# Patient Record
Sex: Male | Born: 1935 | Race: Black or African American | Hispanic: No | Marital: Married | Smoking: Never smoker
Health system: Southern US, Community
[De-identification: ages and names within clinical notes are randomized; demographics above are authoritative.]

## PROBLEM LIST (undated history)

## (undated) DIAGNOSIS — F329 Major depressive disorder, single episode, unspecified: Secondary | ICD-10-CM

## (undated) DIAGNOSIS — F0280 Dementia in other diseases classified elsewhere without behavioral disturbance: Secondary | ICD-10-CM

## (undated) DIAGNOSIS — R569 Unspecified convulsions: Secondary | ICD-10-CM

## (undated) DIAGNOSIS — H409 Unspecified glaucoma: Secondary | ICD-10-CM

## (undated) DIAGNOSIS — I82409 Acute embolism and thrombosis of unspecified deep veins of unspecified lower extremity: Secondary | ICD-10-CM

## (undated) DIAGNOSIS — C61 Malignant neoplasm of prostate: Secondary | ICD-10-CM

## (undated) DIAGNOSIS — E785 Hyperlipidemia, unspecified: Secondary | ICD-10-CM

## (undated) DIAGNOSIS — M1990 Unspecified osteoarthritis, unspecified site: Secondary | ICD-10-CM

## (undated) DIAGNOSIS — E7800 Pure hypercholesterolemia, unspecified: Secondary | ICD-10-CM

## (undated) DIAGNOSIS — N189 Chronic kidney disease, unspecified: Secondary | ICD-10-CM

## (undated) DIAGNOSIS — Z227 Latent tuberculosis: Secondary | ICD-10-CM

## (undated) DIAGNOSIS — E119 Type 2 diabetes mellitus without complications: Secondary | ICD-10-CM

## (undated) DIAGNOSIS — F0390 Unspecified dementia without behavioral disturbance: Secondary | ICD-10-CM

## (undated) DIAGNOSIS — G309 Alzheimer's disease, unspecified: Secondary | ICD-10-CM

## (undated) HISTORY — DX: Unspecified convulsions: R56.9

## (undated) HISTORY — DX: Latent tuberculosis: Z22.7

## (undated) HISTORY — DX: Major depressive disorder, single episode, unspecified: F32.9

## (undated) HISTORY — DX: Dementia in other diseases classified elsewhere without behavioral disturbance: F02.80

## (undated) HISTORY — PX: OTHER SURGICAL HISTORY: SHX169

## (undated) HISTORY — DX: Acute embolism and thrombosis of unspecified deep veins of unspecified lower extremity: I82.409

## (undated) HISTORY — DX: Hyperlipidemia, unspecified: E78.5

## (undated) HISTORY — DX: Unspecified osteoarthritis, unspecified site: M19.90

## (undated) HISTORY — DX: Alzheimer's disease, unspecified: G30.9

## (undated) HISTORY — DX: Dementia in other diseases classified elsewhere, unspecified severity, without behavioral disturbance, psychotic disturbance, mood disturbance, and anxiety: F02.80

## (undated) HISTORY — DX: Pure hypercholesterolemia, unspecified: E78.00

---

## 1998-05-09 ENCOUNTER — Ambulatory Visit (HOSPITAL_BASED_OUTPATIENT_CLINIC_OR_DEPARTMENT_OTHER): Admission: RE | Admit: 1998-05-09 | Discharge: 1998-05-09 | Payer: Self-pay | Admitting: Orthopaedic Surgery

## 2000-06-04 ENCOUNTER — Emergency Department (HOSPITAL_COMMUNITY): Admission: EM | Admit: 2000-06-04 | Discharge: 2000-06-04 | Payer: Self-pay | Admitting: Emergency Medicine

## 2000-06-08 ENCOUNTER — Emergency Department (HOSPITAL_COMMUNITY): Admission: EM | Admit: 2000-06-08 | Discharge: 2000-06-08 | Payer: Self-pay | Admitting: Emergency Medicine

## 2000-06-08 ENCOUNTER — Encounter: Payer: Self-pay | Admitting: Emergency Medicine

## 2003-06-03 ENCOUNTER — Ambulatory Visit (HOSPITAL_COMMUNITY): Admission: RE | Admit: 2003-06-03 | Discharge: 2003-06-03 | Payer: Self-pay | Admitting: Gastroenterology

## 2004-06-17 DIAGNOSIS — C61 Malignant neoplasm of prostate: Secondary | ICD-10-CM

## 2004-06-17 HISTORY — DX: Malignant neoplasm of prostate: C61

## 2005-01-28 ENCOUNTER — Ambulatory Visit (HOSPITAL_COMMUNITY): Admission: RE | Admit: 2005-01-28 | Discharge: 2005-01-28 | Payer: Self-pay | Admitting: Urology

## 2005-02-04 ENCOUNTER — Ambulatory Visit: Admission: RE | Admit: 2005-02-04 | Discharge: 2005-03-05 | Payer: Self-pay | Admitting: Radiation Oncology

## 2005-04-15 ENCOUNTER — Ambulatory Visit: Admission: RE | Admit: 2005-04-15 | Discharge: 2005-07-14 | Payer: Self-pay | Admitting: Radiation Oncology

## 2005-07-10 ENCOUNTER — Encounter: Payer: Self-pay | Admitting: Emergency Medicine

## 2005-07-11 ENCOUNTER — Inpatient Hospital Stay (HOSPITAL_COMMUNITY): Admission: EM | Admit: 2005-07-11 | Discharge: 2005-07-19 | Payer: Self-pay | Admitting: Internal Medicine

## 2005-07-11 ENCOUNTER — Encounter (INDEPENDENT_AMBULATORY_CARE_PROVIDER_SITE_OTHER): Payer: Self-pay | Admitting: Cardiology

## 2005-07-25 ENCOUNTER — Encounter: Admission: RE | Admit: 2005-07-25 | Discharge: 2005-10-23 | Payer: Self-pay | Admitting: Internal Medicine

## 2006-11-06 ENCOUNTER — Emergency Department (HOSPITAL_COMMUNITY): Admission: EM | Admit: 2006-11-06 | Discharge: 2006-11-06 | Payer: Self-pay | Admitting: Emergency Medicine

## 2006-11-14 ENCOUNTER — Encounter: Admission: RE | Admit: 2006-11-14 | Discharge: 2006-11-14 | Payer: Self-pay | Admitting: Internal Medicine

## 2009-07-20 ENCOUNTER — Encounter: Admission: RE | Admit: 2009-07-20 | Discharge: 2009-07-20 | Payer: Self-pay | Admitting: Neurology

## 2009-08-31 ENCOUNTER — Encounter: Admission: RE | Admit: 2009-08-31 | Discharge: 2009-08-31 | Payer: Self-pay | Admitting: General Surgery

## 2009-09-04 ENCOUNTER — Ambulatory Visit (HOSPITAL_BASED_OUTPATIENT_CLINIC_OR_DEPARTMENT_OTHER): Admission: RE | Admit: 2009-09-04 | Discharge: 2009-09-04 | Payer: Self-pay | Admitting: General Surgery

## 2010-05-20 ENCOUNTER — Emergency Department (HOSPITAL_COMMUNITY)
Admission: EM | Admit: 2010-05-20 | Discharge: 2010-05-20 | Payer: Self-pay | Source: Home / Self Care | Admitting: Emergency Medicine

## 2010-08-27 LAB — POCT CARDIAC MARKERS
CKMB, poc: 1.5 ng/mL (ref 1.0–8.0)
CKMB, poc: 1.8 ng/mL (ref 1.0–8.0)
Troponin i, poc: <0.05 ng/mL (ref 0.00–0.09)
Troponin i, poc: <0.05 ng/mL (ref 0.00–0.09)

## 2010-08-27 LAB — BASIC METABOLIC PANEL
CO2: 23 mEq/L (ref 19–32)
Calcium: 8.5 mg/dL (ref 8.4–10.5)
Creatinine, Ser: 1.42 mg/dL (ref 0.4–1.5)
GFR calc Af Amer: 59 mL/min — ABNORMAL LOW (ref >60)
GFR calc non Af Amer: 49 mL/min — ABNORMAL LOW (ref >60)
Glucose, Bld: 132 mg/dL — ABNORMAL HIGH (ref 70–99)

## 2010-08-27 LAB — DIFFERENTIAL
Lymphocytes Relative: 12 % (ref 12–46)
Lymphs Abs: 0.6 10*3/uL — ABNORMAL LOW (ref 0.7–4.0)
Monocytes Relative: 5 % (ref 3–12)
Neutro Abs: 3.9 10*3/uL (ref 1.7–7.7)
Neutrophils Relative %: 81 % — ABNORMAL HIGH (ref 43–77)

## 2010-08-27 LAB — CBC
Platelets: 184 10*3/uL (ref 150–400)
RBC: 4.44 MIL/uL (ref 4.22–5.81)
WBC: 4.9 10*3/uL (ref 4.0–10.5)

## 2010-09-07 LAB — BASIC METABOLIC PANEL
BUN: 17 mg/dL (ref 6–23)
Calcium: 9.3 mg/dL (ref 8.4–10.5)
Creatinine, Ser: 1.35 mg/dL (ref 0.4–1.5)
Glucose, Bld: 78 mg/dL (ref 70–99)

## 2010-09-07 LAB — DIFFERENTIAL
Basophils Absolute: 0 10*3/uL (ref 0.0–0.1)
Eosinophils Absolute: 0.1 10*3/uL (ref 0.0–0.7)
Eosinophils Relative: 2 % (ref 0–5)
Monocytes Absolute: 0.4 10*3/uL (ref 0.1–1.0)
Monocytes Relative: 9 % (ref 3–12)
Neutro Abs: 3.2 10*3/uL (ref 1.7–7.7)

## 2010-09-07 LAB — CBC
MCHC: 32.3 g/dL (ref 30.0–36.0)
MCV: 96.3 fL (ref 78.0–100.0)
RBC: 4.77 MIL/uL (ref 4.22–5.81)

## 2010-09-07 LAB — GLUCOSE, CAPILLARY: Glucose-Capillary: 99 mg/dL (ref 70–99)

## 2010-11-02 NOTE — Discharge Summary (Signed)
NAME:  Derrick Rubio, Derrick Rubio NO.:  [id number]   MEDICAL RECORD NO.:  [id number]          PATIENT TYPE:  INP   LOCATION:  6706                         FACILITY:  [facility]   PHYSICIAN:  Ladell Pier, M.D.   DATE OF BIRTH:  12-27-1935   DATE OF ADMISSION:  07/11/2005  DATE OF DISCHARGE:                                 DISCHARGE SUMMARY   DISCHARGE DIAGNOSES:  1.  Hyperosmolar, nonketotic coma (HNK).  2.  New-onset diabetes.  3.  Rhabdomyolysis.  4.  Hyperkalemia.  5.  Hypokalemia.  6.  Acute on chronic renal failure.  7.  Fever/leukocytosis.  8.  History of abnormal liver function tests.  9.  History of impotence.  10. History of atypical chest pain with normal cardiac stress test.  11. History of prostate cancer, status post radiation therapy and seeding in      November 2006.  12. History of elevated troponin, most likely secondary to acute renal      failure and rhabdomyolysis.  13. Hypernatremia.  14. Altered mental status secondary to hyperosmolar coma.  15. Elevated amylase, most likely secondary to rhabdomyolysis.  16. Anemia.   DISCHARGE MEDICATIONS:  1.  Lantus 34 units q.h.s.  2.  Glucotrol XL 10 mg q.a.m.  3.  Sliding scale insulin, moderate sensitivity.  4.  Aspirin 81 mg daily.  5.  Flomax 0.4 mg daily.  6.  Avelox 400 mg daily x7 days.   FOLLOW-UP APPOINTMENTS:  Patient to follow up in the office for diabetic  teaching classes and follow up with Dr. Olena Leatherwood in one to two weeks.   CONSULTATIONS:  Renal for acute renal failure, the patient to follow up with  Dr. Kathrene Bongo in two weeks.   PROCEDURES:  The patient had a PICC line placed and a Panda placed.   HISTORY OF PRESENT ILLNESS:  The patient is a 75 year old African-American  male brought by his wife to the emergency room secondary to weight loss and  urinary frequency.  He has had dry mouth for the past few days.  The patient  developed confusion and change in his mental status prior to  arriving in the  emergency room.  He had no chest pain or shortness of breath or abdominal  pain.  He complained of pain in the groin area secondary to his prostate  cancer treatment.   Past medical history, family history, social history, medications,  allergies, review of systems are as per admission H&P.   PHYSICAL EXAMINATION ON DISCHARGE:  VITAL SIGNS:  Temperature 98.9, pulse of  80, respirations 20, blood pressure 95/62.  CBGs 272-187-272.  Pulse  oximetry 98% on room air.  HEENT:  Head is normocephalic, atraumatic.  Pupils equal, round, and  reactive to light.  Throat without erythema.  CARDIOVASCULAR:  Regular rate and rhythm.  LUNGS:  Clear bilaterally.  ABDOMEN:  Positive bowel sounds, soft, nontender, nondistended.  EXTREMITIES:  Without edema.   HOSPITAL COURSE:  Problem 1.  HYPEROSMOLAR, NONKETOTIC HYPERGLYCEMIC COMA:  The patient was  admitted to the intensive care unit.  On admission his blood sugar was over  1100, it was 1157.  The patient was started on a Glucommander and his blood  sugars came down into the low 100s.  However, because he was hypernatremic,  he was kept on the Glucommander for quite a few days until his sodium  resolved because he was getting free water to prevent cell lysis.  On  discharge his blood sugars were running in the 187-277.  His diabetic  medications will be adjusted on an outpatient basis.   Problem 2.  HYPERNATREMIA:  During this hospitalization on admission, his  sodium was in the 150s.  He got normal saline for volume replacement because  he was dehydrated.  His sodium, however, went up into the 160s, and he  received half normal saline but sodium continued to increase.  His half  normal saline was changed to quarter normal saline and sodium still was  elevated.  His half normal saline was changed to D5 water.  Renal was  consulted and suggested also placing a Panda and giving him free water  through the [facility], given that his  sodium had normalized.   Problem 3.  ACUTE ON CHRONIC RENAL FAILURE:  His creatinine on admission was  elevated.  During the rehydration process, creatinine came down.  On  discharge his creatinine was 1.9.  He will follow up with nephrology  outpatient.  On discharge his creatinine was 1.6.   Problem 4.  ABNORMAL LIVER FUNCTION TESTS:  He has had a history of abnormal  liver function tests with his prior physician that was worked up outpatient.  Will review more blood work or tests on outpatient basis.  His ultrasound  study he received while inpatient was normal.   Problem 5.  ABNORMAL ELECTROCARDIOGRAM/MILDLY ELEVATED TROPONINS:  He had a  2-D echo that was normal.  He has had outpatient cardiac workup, stress test  that was also normal.  The patient had no chest pain during his  hospitalization.   Problem 6.  FEVER/LEUKOCYTOSIS:  He has had a fever and elevated white count  during his hospitalization.  Blood cultures, urine cultures were normal.  Chest x-ray did show some atelectasis.  He was placed on Rocephin and  switched to Zosyn and Avelox.  His fever resolved.  He had no respiratory,  no urinary symptoms.  He was discharged on Avelox for seven days.  A  possible reason for his fever was possibly from atelectasis, or it could be  a small infiltrate in the lungs.   Problem 7.  ANEMIA:  His hemoglobin dropped during his hospitalization.  On  discharge his hemoglobin was 10.5.  On follow-up will evaluate, recheck CBC  and evaluate to see if the patient has had a screening colonoscopy.  If not  done, then the patient will receive screening colonoscopy outpatient.   Problem 8.  HISTORY OF PROSTATE CANCER:  The patient will follow up with his  urologist for his prostate cancer.  He sees Dr. Brunilda Payor.   Problem 9.  ALTERED MENTAL STATUS:  When sodium/hypernatremia resolved, his mental status cleared.  He was alert and oriented x3.  He was transferred  out from the ICU to the regular  floor and did well, ambulating with his  family, and was able to ask questions and seemed back to his baseline per  his family prior to discharge.   Problem 10.  RHABDOMYOLYSIS:  His CK was elevated up into the 30,000.  On  discharge, his CK had decreased down to 734.   Problem 11.  HYPERKALEMIA:  After he got fluid replacement, his  potassium/hyperkalemia resolved.  He became hypokalemic and required  potassium replacement.   DISCHARGE LABORATORY DATA:  CK 734.  Sodium 136, potassium 3.5, chloride  105, CO2 23, glucose 224, BUN 13, creatinine 1.6, calcium 8.1.  BNP 93.  CBC:  WBC 10.5, hemoglobin 10.6, platelets 192, MCV of 92.  Total protein  was 1.8 g.  UPEP:  No gamma chains noted.  It was more nonspecific pattern.  Blood cultures negative x2.      Ladell Pier, M.D.  Electronically Signed     NJ/MEDQ  D:  07/18/2005  T:  07/18/2005  Job:  [id number]   cc:   Lindaann Slough, M.D.  Fax: [phone]

## 2010-11-02 NOTE — Discharge Summary (Signed)
NAME:  Derrick, Rubio NO.:  [id number]   MEDICAL RECORD NO.:  [id number]           PATIENT TYPE:   LOCATION:                                 FACILITY:   PHYSICIAN:  Ladell Pier, M.D.        DATE OF BIRTH:   DATE OF ADMISSION:  07/11/2005  DATE OF DISCHARGE:  07/19/2005                                 DISCHARGE SUMMARY   ADDENDUM:   DISCHARGE DIAGNOSES:  1.  Hiccups.  Patient was not discharged on the 24th.  He stayed on until      the 25th because he was having severe hiccups.  For the hiccups sucking      on lemon, gargling with ice water was tried and also chlorpromazine 25      mg t.i.d. with just a small improvement in the hiccups.  They were not      as severe.  Patient will go home.  Discharged him.  In addition to other      medications.  He was discharged on Reglan 10 mg t.i.d. a.c. and q.h.s.,      Protonix 40 mg b.i.d., Ambien 10 mg q.h.s. as needed, and K-Dur 40 mEq      twice daily x2 days.  He has an appointment to follow up in the office      on February 8.  Also, he was told to continue the use the incentive      spirometer.  If symptoms do not improve then will do further work-up for      hiccups.  2.  Hypokalemia.  On discharge potassium was 3.3.  Magnesium level was added      to the laboratory and he was given K-Dur 40 and discharge with potassium      40 b.i.d. x2 days.      Ladell Pier, M.D.  Electronically Signed     NJ/MEDQ  D:  07/19/2005  T:  07/19/2005  Job:  [id number]   cc:   Cecille Aver, M.D.  Fax: [phone]   Lindaann Slough, M.D.  Fax: [phone]

## 2010-11-02 NOTE — Consult Note (Signed)
NAME:  KAVEON, BLATZ NO.:  [id number]   MEDICAL RECORD NO.:  [id number]          PATIENT TYPE:  INP   LOCATION:  2112                         FACILITY:  [facility]   PHYSICIAN:  Cecille Aver, M.D.DATE OF BIRTH:  May 19, 1936   DATE OF CONSULTATION:  07/12/2005  DATE OF DISCHARGE:                                   CONSULTATION   REASON FOR REFERRAL:  Hypernatremia and chronic kidney disease.   HISTORY OF PRESENT ILLNESS:  Mr. Dugal is a 75 year old white male  with a past medical history significant for prostate cancer, status post XRT  and seeds in November 2006. Otherwise, he had been relatively healthy with  no history of diabetes mellitus. He presented with confusion and polyuria  and was discovered to have a glucose of greater than 1100, essentially HONK  with an initial creatinine of 2.3 with an unknown baseline. With treatment  to get sugar down he has now developed hypernatremia and continues to be  confused. Creatinine remains at 2.0. The patient does have a good urine  output. He also does have an element of rhabdomyolysis with a CK of 31,000.  We are asked to assist with sodium. Appropriate changes are made this  morning to his IV fluids. We are also starting to give him free water per  [facility]. The patient is sedated at this time and history is obtained by family  members at bedside.   PAST MEDICAL HISTORY:  1.  Prostate cancer as above.  2.  Degenerative joint disease.  3.  History of abnormal LFTs, unsure of what workup has been done.   CURRENT MEDICATIONS:  Lovenox, Protonix, Seroquel 25 mg b.i.d., D-5-W drip  at 150 an hour, free water 250 q.6h.   ALLERGIES:  No known drug allergies.   SOCIAL HISTORY:  Obtained from the family. The patient was very functional,  working out regularly.   FAMILY HISTORY:  Negative for kidney disease.   REVIEW OF SYSTEMS:  Unable to obtain from the patient due to decreased  mental status. He is  sedated due to frequent agitation.   PHYSICAL EXAMINATION:  VITAL SIGNS: Temperature is 100.6, blood pressure 90  to 120 over 70, heart rate 100, respirations are normal, oxygen saturation  is 92% on room air.  Urine output was recorded at 705 mL over the last  shift.  GENERAL: The patient is sedated, resting. He is responsive only to noxious  stimuli. He had just received Phenergan, morphine, and Seroquel. Based on  mental status, there has been agitation this hospitalization.  HEENT: Pupils equal, round, and reactive to light. Extraocular motions are  intact. Mucous membranes are moist.  NECK: There is no jugular venous distention.  LUNGS: Anteriorly clear.  CARDIOVASCULAR: Tachycardic without murmurs.  ABDOMEN: Positive bowel sounds, soft, nontender.  EXTREMITIES: Some stasis edema.  NEUROLOGIC: The patient is somnolent and cannot follow commands.   LABORATORY DATA:  This morning sodium 162, chloride greater than 130, BUN  and creatinine 20/2.0, AST 572, ALT 176, albumin 3.1, hemoglobin 14.9, CK  31,000.   Abdominal ultrasound reveals normal gallbladder, increased echogenicity of  the liver,  but the kidneys are normal size with no hydro.   ASSESSMENT:  A 75 year old black male with HONK, hypernatremia, and free  water deficit with decreased mental status.  1.  Hypernatremia  with free water deficit. Have increased free water      getting IV, NPO. Will monitor sodium closely.  2.  HONK. The patient is on insulin drip and this is managed per the primary      team.  3.  Increased liver function tests, apparently chronic in nature. Abdominal      ultrasound is negative. This is also __________ .  4.  Confusion: Possibly due some to hypernatremia. He is on some sedating      medications and with abnormal renal function and abnormal liver function      this could be playing a role.  5.  Rhabdomyolysis: Makes it even more important to make sure that he is      hydrated. Creatinine is  2, but stable. Possibly could be subacute      secondary to volume depletion. Historic information would be helpful.      We will try to confirm with Dr. Olena Leatherwood what his creatinine was as an      outpatient.           ______________________________  Cecille Aver, M.D.     KAG/MEDQ  D:  07/12/2005  T:  07/12/2005  Job:  [id number]

## 2010-11-02 NOTE — Op Note (Signed)
NAME:  [name], [name] NO.:  [id number]   MEDICAL RECORD NO.:  [id number]                   PATIENT TYPE:  AMB   LOCATION:  ENDO                                 [facility]ACILITY[facility]  [facility]   PHYSICIAN:  Danise Edge, M.D.                DATE OF BIRTH:  05-Oct-1935   DATE OF PROCEDURE:  06/03/2003  DATE OF DISCHARGE:                                 OPERATIVE REPORT   PROCEDURE:  Screening colonoscopy.   INDICATIONS FOR PROCEDURE:  Derrick Rubio is a 75 year old male  born 02-Mar-1936.  Derrick Rubio is scheduled to undergo his first  screening colonoscopy with polypectomy to prevent colon cancer.   ENDOSCOPIST:  Danise Edge, M.D.   PREMEDICATION:  Versed 5 mg, Demerol 50 mg.   DESCRIPTION OF PROCEDURE:  After obtaining informed consent, Derrick Rubio  was placed in the left lateral decubitus position. I administered  intravenous Demerol and intravenous Versed to achieve conscious sedation for  the procedure. The patient's blood pressure, oxygen saturation and cardiac  rhythm were monitored throughout the procedure and documented in the medical  record.   Anal inspection was normal. Digital rectal exam revealed an enlarged but  nonnodular prostate.  The Olympus adjustable pediatric colonoscope was  introduced into the rectum and advanced to the cecum. A normal appearing  ileocecal valve was intubated and the distal ileum inspected. Colonic  preparation for the exam today was satisfactory.   RECTUM:  Normal.   SIGMOID COLON AND DESCENDING COLON:  Normal.   SPLENIC FLEXURE:  Normal.   TRANSVERSE COLON:  Normal.   HEPATIC FLEXURE:  Normal.   ASCENDING COLON:  Normal.   CECUM AND ILEOCECAL VALVE:  Normal.   DISTAL ILEUM:  Normal.   ASSESSMENT:  Normal screening proctocolonoscopy to the cecum. No endoscopic  evidence for the presence of colorectal neoplasia.                                               Danise Edge,  M.D.    MJ/MEDQ  D:  06/03/2003  T:  06/04/2003  Job:  [id number]   cc:   Ike Bene, M.D.  [address]  [place]  [place] [phone]  Fax: [phone]

## 2010-11-02 NOTE — H&P (Signed)
NAME:  Derrick Rubio, MICUCCI NO.:  [id number]   MEDICAL RECORD NO.:  [id number]          PATIENT TYPE:  INP   LOCATION:  0101                         FACILITY:  [facility]   PHYSICIAN:  Ladell Pier, M.D.   DATE OF BIRTH:  January 13, 1936   DATE OF ADMISSION:  07/10/2005  DATE OF DISCHARGE:                                HISTORY & PHYSICAL   CHIEF COMPLAINT:  Altered mental status and weight loss.   HISTORY OF PRESENT ILLNESS:  The patient is a 75 year old African-American  male brought by his wife to the ED secondary to weight loss and urinary  frequency.  Dry mouth for the past few days.  Then today, he developed  confusion and change in mental status, so she brought him to the emergency  room.  He had no chest pain, no shortness of breath, no abdominal pain.  He  complained of pain in the groin area from his prostate cancer and seeding  implants.   PAST MEDICAL HISTORY:  1.  Osteoarthritis.  2.  Prostate cancer status post XRT and seeding in November of 2006.  3.  Atypical chest pain.  Normal cardiac stress test.  4.  History of abnormal LFT's.  5.  History of impotence.   FAMILY HISTORY:  Father died at 99 from heart disease.  Mother died at 64  from coronary artery disease and diabetes.  Four brothers - one died of a  heart attack, one died of a gunshot wound.  Five sisters - one died of  complications of hypertension.   SOCIAL HISTORY:  He is married.  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  He does not smoke or drink alcohol.   MEDICATIONS:  1.  Aspirin 81 mg daily.  2.  Flomax 0.4 mg daily.   ALLERGIES:  No known drug allergies.   REVIEW OF SYSTEMS:  Unable to obtain secondary to altered mental status.   PHYSICAL EXAMINATION:  VITAL SIGNS:  Temperature 98.3, blood pressure  115/83, pulse 119, respirations 18, pulse oximetry 99% on room air.  HEENT:  Normocephalic and atraumatic.  Pupils equal, round and reactive to  light.   Throat without erythema.  CARDIOVASCULAR:  Regular rate and rhythm.  LUNGS:  Clear bilaterally.  ABDOMEN:  Positive bowel sounds, soft, nontender.  EXTREMITIES:  Without edema.   LABORATORY DATA:  WBCs of 10.6, hemoglobin of 16.3, platelets 290.  MCV 94.  Sodium 137, potassium 6.8, chloride 99, CO2 of 21, BUN 47, creatinine 2.3,  glucose 1157, calcium 9.6, potassium repeat at 5.3.  Anion gap of 17.  Myoglobin greater than 500.  MB fraction 12.3.  Troponin less than 0.05.  CK  3410.  EKG still sinus tachycardia.  No acute ST segment elevation or  depression.  Chest x-ray with no acute disease.   ASSESSMENT AND PLAN:  1.  Hyperosmolar non-ketotic coma (HNK).  The patient is now a new onset      diabetic.  Will look for any source of infection.  Will do blood      cultures, UA, hemoglobin A1C, chest x-ray, CMP, CBC, PT  INR, cardiac      enzymes.  Will place him on an insulin drip and give IV fluids.  2.  Rhabdomyolysis.  Will check serial creatinine kinase, given IV fluids.      Normal saline bolus x5 liters of half normal saline.  3.  Hyperkalemia, most likely secondary to electrolyte deficits.  The      patient most like has an electrolyte deficit.  Will monitor potassium      and other electrolytes.  4.  Acute renal failure secondary to dehydration.  Will continue IV fluids      and watch out for renal failure with rhabdomyolysis.      Ladell Pier, M.D.  Electronically Signed     NJ/MEDQ  D:  07/10/2005  T:  07/10/2005  Job:  [id number]

## 2013-03-08 ENCOUNTER — Encounter: Payer: Self-pay | Admitting: *Deleted

## 2013-03-08 ENCOUNTER — Ambulatory Visit (INDEPENDENT_AMBULATORY_CARE_PROVIDER_SITE_OTHER): Payer: Medicare Other | Admitting: Internal Medicine

## 2013-03-08 ENCOUNTER — Encounter: Payer: Self-pay | Admitting: Internal Medicine

## 2013-03-08 VITALS — BP 162/80 | HR 56 | Temp 97.6°F | Ht 72.0 in | Wt 199.0 lb

## 2013-03-08 DIAGNOSIS — A150 Tuberculosis of lung: Secondary | ICD-10-CM

## 2013-03-08 DIAGNOSIS — Z227 Latent tuberculosis: Secondary | ICD-10-CM

## 2013-03-08 MED ORDER — RIFAMPIN 300 MG PO CAPS: 600.0000 mg | ORAL_CAPSULE | Freq: Every day | ORAL | Status: DC

## 2013-03-08 MED ORDER — PROMETHAZINE HCL 25 MG PO TABS: 25.0000 mg | ORAL_TABLET | Freq: Four times a day (QID) | ORAL | Status: DC | PRN

## 2013-03-08 NOTE — Progress Notes (Signed)
[facility] NOTE  RFV: community referral for positive PPD Subjective:    Patient ID: Derrick Rubio, male    DOB: 1935-11-21, 77 y.o.   MRN: [id number]  HPI 77yo M with alhzheimers disease, followed by Dr. Nehemiah Settle. His family is thinking about placing him in assisted living where TB screening is required. Last week, he placed on Monday and quickly became raised, and beefy red on Wednesday by Dr. Nehemiah Settle. The lesion is a 80 days old, and measures 10mm, smaller than it had been per his wifes report. He had cxr which was negative The patient is not known to have been tested before but son may recall that he was previously that his dad was tested before and previously positive roughly 30 years ago. Not previously treated. 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  Patient denies fever, chills, nightsweats, cough or recent weight loss  Meds: lipitor celexa aricept nameda  No Known Allergies  Pmhx: alhzheimer's disease Hyperlipidemia Diabetes controlled by diet depresison Glaucoma Cataracts Prostate ca s/p radiation seeds in 2006 GERD osteoarthritis   Soc hx: Engineer, petroleum for homeless shelter. Retired in 2010. No smoking.   Family hx: alzheimer's dementia  With siblings and parents  Outside records: cr 1.19, ast 25, alt 25, chol 261, lil 199 hdl 45  Review of Systems Memory deficits, arthritis, 12 point ROS are negative    Objective:   Physical Exam BP 162/80  Pulse 56  Temp(Src) 97.6 F (36.4 C) (Oral)  Ht 6' (1.829 m)  Wt 199 lb (90.266 kg)  BMI 26.98 kg/m2 Physical Exam  Constitutional: He is oriented to person, place, and time. He appears well-developed and well-nourished. No distress.  HENT:  Mouth/Throat: Oropharynx is clear and moist. No oropharyngeal exudate.  Cardiovascular: Normal rate, regular rhythm and normal heart sounds. Exam  reveals no gallop and no friction rub.  No murmur heard.  Pulmonary/Chest: Effort normal and breath sounds normal. No respiratory distress. He has no wheezes.  Abdominal: Soft. Bowel sounds are normal. He exhibits no distension. There is no tenderness.  Lymphadenopathy:  He has no cervical adenopathy.  Neurological: He is alert and oriented to person, place, and time.  Skin: Skin is warm and dry. No rash noted. No erythema.  Psychiatric: He has a normal mood and affect. His behavior is normal. 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 He has 5% chance of converting to active TB for the remaining part of his life. Since there is likely a facilities requirement and he is in good state of health sans dementia, we will recommend treatment with rifampin 600mg  daily (can split dose if nausea+ to 300mg  bid). Will get baseline CMP. Will give rx for anti-emetic.  rtc in 3 wk to check how he is doing.  Will need to check CXR to ensure no findings concerning for mTB

## 2013-04-01 ENCOUNTER — Encounter: Payer: Self-pay | Admitting: Internal Medicine

## 2013-04-01 ENCOUNTER — Ambulatory Visit (INDEPENDENT_AMBULATORY_CARE_PROVIDER_SITE_OTHER): Payer: Medicare Other | Admitting: Internal Medicine

## 2013-04-01 VITALS — BP 132/76 | HR 56 | Temp 97.8°F | Wt 200.0 lb

## 2013-04-01 DIAGNOSIS — Z227 Latent tuberculosis: Secondary | ICD-10-CM

## 2013-04-01 DIAGNOSIS — A150 Tuberculosis of lung: Secondary | ICD-10-CM

## 2013-04-01 LAB — COMPLETE METABOLIC PANEL WITH GFR
ALT: 27 U/L (ref 0–53)
Alkaline Phosphatase: 83 U/L (ref 39–117)
CO2: 29 mEq/L (ref 19–32)
Calcium: 9.4 mg/dL (ref 8.4–10.5)
Chloride: 101 mEq/L (ref 96–112)
GFR, Est African American: 74 mL/min
Glucose, Bld: 93 mg/dL (ref 70–99)

## 2013-04-01 NOTE — Progress Notes (Signed)
[facility] NOTE  RFV: latent tb tx Subjective:    Patient ID: Derrick Rubio, male    DOB: 06/20/1935, 77 y.o.   MRN: [id number]  HPI Derrick Rubio is a 77yo M with dementia who has latent TB undergoing treatment in part due to his assisted living contract. He has been on treatment for 1 month and doing well. He denies nausea with the BID dosing regimen. No recent illness.  Current Outpatient Prescriptions on File Prior to Visit  Medication Sig Dispense Refill  . atorvastatin (LIPITOR) 10 MG tablet Take 10 mg by mouth daily.      . citalopram (CELEXA) 20 MG tablet Take 20 mg by mouth daily.      Marland Kitchen donepezil (ARICEPT) 10 MG tablet Take 10 mg by mouth at bedtime as needed.      . memantine (NAMENDA) 10 MG tablet Take 10 mg by mouth 2 (two) times daily.      . promethazine (PHENERGAN) 25 MG tablet Take 1 tablet (25 mg total) by mouth every 6 (six) hours as needed for nausea.  30 tablet  3  . rifampin (RIFADIN) 300 MG capsule Take 2 capsules (600 mg total) by mouth daily.  180 capsule  0   No current facility-administered medications on file prior to visit.   Active Ambulatory Problems    Diagnosis Date Noted  . No Active Ambulatory Problems   Resolved Ambulatory Problems    Diagnosis Date Noted  . No Resolved Ambulatory Problems   No Additional Past Medical History       Review of Systems Review of Systems  Constitutional: Negative for fever, chills, diaphoresis, activity change, appetite change, fatigue and unexpected weight change.  HENT: Negative for congestion, sore throat, rhinorrhea, sneezing, trouble swallowing and sinus pressure.  Eyes: Negative for photophobia and visual disturbance.  Respiratory: Negative for cough, chest tightness, shortness of breath, wheezing and stridor.  Cardiovascular: Negative for chest pain, palpitations and leg swelling.  Gastrointestinal: Negative for nausea, vomiting, abdominal pain, diarrhea, constipation, blood in stool, abdominal  distention and anal bleeding.  Genitourinary: Negative for dysuria, hematuria, flank pain and difficulty urinating.  Musculoskeletal: Negative for myalgias, back pain, joint swelling, arthralgias and gait problem.  Skin: Negative for color change, pallor, rash and wound.  Neurological: Negative for dizziness, tremors, weakness and light-headedness.  Hematological: Negative for adenopathy. Does not bruise/bleed easily.  Psychiatric/Behavioral: Negative for behavioral problems, confusion, sleep disturbance, dysphoric mood, decreased concentration and agitation.       Objective:   Physical Exam BP 132/76  Pulse 56  Temp(Src) 97.8 F (36.6 C) (Oral)  Wt 200 lb (90.719 kg)  BMI 27.12 kg/m2 Physical Exam  Constitutional: He is oriented to person, place, and time. He appears well-developed and well-nourished. No distress.  HENT: no jaundice Mouth/Throat: Oropharynx is clear and moist. No oropharyngeal exudate.  Lymphadenopathy:  no cervical adenopathy.  Neurological:alert and oriented to person, place, and time.  Skin: Skin is warm and dry. No rash noted. No erythema.  Psychiatric: He has a normal mood and flat affect  Labs CMP     Component Value Date/Time   NA 138 04/01/2013 1651   K 4.5 04/01/2013 1651   CL 101 04/01/2013 1651   CO2 29 04/01/2013 1651   GLUCOSE 93 04/01/2013 1651   BUN 15 04/01/2013 1651   CREATININE 1.12 04/01/2013 1651   CREATININE 1.42 05/20/2010 1338   CALCIUM 9.4 04/01/2013 1651   PROT 7.7 04/01/2013 1651   ALBUMIN 4.5 04/01/2013 1651  AST 27 04/01/2013 1651   ALT 27 04/01/2013 1651   ALKPHOS 83 04/01/2013 1651   BILITOT 0.3 04/01/2013 1651   GFRNONAA 49* 05/20/2010 1338   GFRAA  Value: 59        The eGFR has been calculated using the MDRD equation. This calculation has not been validated in all clinical situations. eGFR's persistently <60 mL/min signify possible Chronic Kidney Disease.* 05/20/2010 1338        Assessment & Plan:  Latent tb   =continue with rifampin 300mg  BID for 3 additional months. Will check CMP to ensure no transaminitis. Return in 3 months at end of course of therapy.

## 2013-06-01 ENCOUNTER — Emergency Department (HOSPITAL_COMMUNITY): Payer: Managed Care, Other (non HMO)

## 2013-06-01 ENCOUNTER — Emergency Department (HOSPITAL_COMMUNITY)
Admission: EM | Admit: 2013-06-01 | Discharge: 2013-06-01 | Disposition: A | Discharge status: Home / Self Care | Payer: Managed Care, Other (non HMO) | Attending: Emergency Medicine | Admitting: Emergency Medicine

## 2013-06-01 ENCOUNTER — Encounter (HOSPITAL_COMMUNITY): Payer: Self-pay | Admitting: Emergency Medicine

## 2013-06-01 DIAGNOSIS — F329 Major depressive disorder, single episode, unspecified: Secondary | ICD-10-CM | POA: Insufficient documentation

## 2013-06-01 DIAGNOSIS — Z79899 Other long term (current) drug therapy: Secondary | ICD-10-CM | POA: Insufficient documentation

## 2013-06-01 DIAGNOSIS — R0789 Other chest pain: Secondary | ICD-10-CM | POA: Insufficient documentation

## 2013-06-01 DIAGNOSIS — F3289 Other specified depressive episodes: Secondary | ICD-10-CM | POA: Insufficient documentation

## 2013-06-01 DIAGNOSIS — F0390 Unspecified dementia without behavioral disturbance: Secondary | ICD-10-CM | POA: Insufficient documentation

## 2013-06-01 HISTORY — DX: Unspecified dementia, unspecified severity, without behavioral disturbance, psychotic disturbance, mood disturbance, and anxiety: F03.90

## 2013-06-01 LAB — BASIC METABOLIC PANEL
Calcium: 9 mg/dL (ref 8.4–10.5)
Chloride: 100 mEq/L (ref 96–112)
GFR calc Af Amer: 79 mL/min — ABNORMAL LOW (ref >90)
GFR calc non Af Amer: 68 mL/min — ABNORMAL LOW (ref >90)
Glucose, Bld: 111 mg/dL — ABNORMAL HIGH (ref 70–99)
Sodium: 134 mEq/L — ABNORMAL LOW (ref 135–145)

## 2013-06-01 LAB — CBC WITH DIFFERENTIAL/PLATELET
Basophils Absolute: 0 10*3/uL (ref 0.0–0.1)
Basophils Relative: 1 % (ref 0–1)
Eosinophils Absolute: 0.2 10*3/uL (ref 0.0–0.7)
Eosinophils Relative: 5 % (ref 0–5)
Hemoglobin: 13.8 g/dL (ref 13.0–17.0)
Lymphs Abs: 1 10*3/uL (ref 0.7–4.0)
MCH: 32 pg (ref 26.0–34.0)
MCHC: 34.7 g/dL (ref 30.0–36.0)
MCV: 92.3 fL (ref 78.0–100.0)
Monocytes Relative: 11 % (ref 3–12)
Neutro Abs: 2.6 10*3/uL (ref 1.7–7.7)
Platelets: 183 10*3/uL (ref 150–400)
RDW: 13.5 % (ref 11.5–15.5)

## 2013-06-01 LAB — PROTIME-INR
INR: 1.1 (ref 0.00–1.49)
Prothrombin Time: 14 seconds (ref 11.6–15.2)

## 2013-06-01 NOTE — ED Notes (Signed)
Family at the bedside.

## 2013-06-01 NOTE — ED Notes (Signed)
Pt is from adult day care and ate 20 minutes ago and then 15 minutes ago started having chest pain to epigastric area.  Pt is pain free now.  Pt has had 324mg  asa.  No cardiac history.  Pt has dementia

## 2013-06-01 NOTE — ED Notes (Signed)
Lab at the bedside 

## 2013-06-01 NOTE — ED Notes (Signed)
Tech attempted lab draw, unsuccessful. Lab informed.

## 2013-06-01 NOTE — ED Provider Notes (Signed)
CSN: [id number]     Arrival date & time 06/01/13  1255 History   First MD Initiated Contact with Patient 06/01/13 1309     Chief Complaint  Patient presents with  . Chest Pain   (Consider location/radiation/quality/duration/timing/severity/associated sxs/prior Treatment) HPI Comments: Patient got to the emergency department by EMS. Patient was at adult daycare when he began to complain of chest pain. He had eaten 5 minutes prior to onset of the symptoms. Patient's complaint of discomfort in the center of his chest and upper abdomen area. EMS reports that the pain has resolved during transport. Total time is 5-10 minutes of pain. At arrival, the patient is without complaints.  Patient is a 77 y.o. male presenting with chest pain.  Chest Pain   Past Medical History  Diagnosis Date  . Depression   . Dementia    No past surgical history on file. No family history on file. History  Substance Use Topics  . Smoking status: Never Smoker   . Smokeless tobacco: Never Used  . Alcohol Use: No    Review of Systems  Cardiovascular: Positive for chest pain.    Allergies  Review of patient's allergies indicates no known allergies.  Home Medications   Current Outpatient Rx  Name  Route  Sig  Dispense  Refill  . atorvastatin (LIPITOR) 10 MG tablet   Oral   Take 10 mg by mouth daily.         . citalopram (CELEXA) 20 MG tablet   Oral   Take 20 mg by mouth daily.         Marland Kitchen donepezil (ARICEPT) 10 MG tablet   Oral   Take 10 mg by mouth at bedtime as needed.         . memantine (NAMENDA) 10 MG tablet   Oral   Take 10 mg by mouth 2 (two) times daily.         . promethazine (PHENERGAN) 25 MG tablet   Oral   Take 1 tablet (25 mg total) by mouth every 6 (six) hours as needed for nausea.   30 tablet   3   . rifampin (RIFADIN) 300 MG capsule   Oral   Take 2 capsules (600 mg total) by mouth daily.   180 capsule   0    There were no vitals taken for this visit. Physical  Exam  Constitutional: He appears well-developed and well-nourished. No distress.  HENT:  Head: Normocephalic and atraumatic.  Right Ear: Hearing normal.  Left Ear: Hearing normal.  Nose: Nose normal.  Mouth/Throat: Oropharynx is clear and moist and mucous membranes are normal.  Eyes: Conjunctivae and EOM are normal. Pupils are equal, round, and reactive to light.  Neck: Normal range of motion. Neck supple.  Cardiovascular: Regular rhythm, S1 normal and S2 normal.  Exam reveals no gallop and no friction rub.   No murmur heard. Pulmonary/Chest: Effort normal and breath sounds normal. No respiratory distress. He exhibits no tenderness.  Abdominal: Soft. Normal appearance and bowel sounds are normal. There is no hepatosplenomegaly. There is no tenderness. There is no rebound, no guarding, no tenderness at McBurney's point and negative Murphy's sign. No hernia.  Musculoskeletal: Normal range of motion.  Neurological: He is alert. He has normal strength. He is disoriented. No cranial nerve deficit or sensory deficit. Coordination normal. GCS eye subscore is 4. GCS verbal subscore is 5. GCS motor subscore is 6.  Skin: Skin is warm, dry and intact. No rash noted. No cyanosis.  Psychiatric: He has a normal mood and affect. His speech is normal and behavior is normal. Thought content normal.    ED Course  Procedures (including critical care time) Labs Review Labs Reviewed  CBC WITH DIFFERENTIAL  BASIC METABOLIC PANEL  TROPONIN I  PROTIME-INR   Imaging Review No results found.  EKG Interpretation    Date/Time:  Tuesday June 01 2013 13:00:47 EST Ventricular Rate:  73 PR Interval:  146 QRS Duration: 77 QT Interval:  390 QTC Calculation: 430 R Axis:   9 Text Interpretation:  Age not entered, assumed to be  77 years old for purpose of ECG interpretation Sinus rhythm Probable anteroseptal infarct, old Baseline wander in lead(s) II III aVF No significant change since last tracing  Confirmed by Elfego Giammarino  MD, Dessa Ledee ([id number]) on 06/01/2013 1:11:48 PM            MDM  Diagnosis: Atypical chest pain  Patient presents to the ER for evaluation of chest pain. Patient had brief episode of pain in the center of his chest, lasting 5-10 minutes and then self resolve in, after eating lunch. At arrival he was without complaints. Workup was entirely unremarkable. Patient will be discharged, followup with primary doctor as needed. Return for any prolonged chest pain.    Gilda Crease, MD 06/01/13 [phone]

## 2013-07-01 ENCOUNTER — Ambulatory Visit: Payer: Medicare Other | Admitting: Internal Medicine

## 2013-07-06 ENCOUNTER — Telehealth: Payer: Self-pay | Admitting: *Deleted

## 2013-07-06 NOTE — Telephone Encounter (Signed)
Patient transferred to [facility] 12/26, but Rifampin 300mg  BID was left off his medication list.  Sharlene Motts, RN calling to see if patient should still be taking this, and if so, to please send a rx with start/stop date to [phone] attn nursing.  Patient needs to reschedule his follow up with Dr. Baxter Flattery. Please advise course of treatment. Landis Gandy, RN

## 2013-07-08 NOTE — Telephone Encounter (Signed)
Could you have the facility continue his rifampin for an additional 2 months. rif 300mg  BID x 2 months to finish course of therapy for LTBI

## 2013-07-08 NOTE — Telephone Encounter (Signed)
Verbal order sent. Facility will send an order for MD to sign. Myrtis Hopping

## 2013-07-27 ENCOUNTER — Other Ambulatory Visit: Payer: Self-pay | Admitting: Internal Medicine

## 2013-07-27 DIAGNOSIS — Z227 Latent tuberculosis: Secondary | ICD-10-CM

## 2013-08-13 ENCOUNTER — Encounter (HOSPITAL_BASED_OUTPATIENT_CLINIC_OR_DEPARTMENT_OTHER): Payer: Managed Care, Other (non HMO) | Attending: General Surgery

## 2013-08-13 DIAGNOSIS — G309 Alzheimer's disease, unspecified: Secondary | ICD-10-CM | POA: Diagnosis not present

## 2013-08-13 DIAGNOSIS — F0280 Dementia in other diseases classified elsewhere without behavioral disturbance: Secondary | ICD-10-CM | POA: Insufficient documentation

## 2013-08-13 DIAGNOSIS — M129 Arthropathy, unspecified: Secondary | ICD-10-CM | POA: Diagnosis not present

## 2013-08-13 DIAGNOSIS — L89609 Pressure ulcer of unspecified heel, unspecified stage: Secondary | ICD-10-CM | POA: Diagnosis present

## 2013-08-13 DIAGNOSIS — E119 Type 2 diabetes mellitus without complications: Secondary | ICD-10-CM | POA: Insufficient documentation

## 2013-08-13 DIAGNOSIS — I10 Essential (primary) hypertension: Secondary | ICD-10-CM | POA: Insufficient documentation

## 2013-08-13 DIAGNOSIS — L8991 Pressure ulcer of unspecified site, stage 1: Secondary | ICD-10-CM | POA: Diagnosis not present

## 2013-08-13 DIAGNOSIS — Z79899 Other long term (current) drug therapy: Secondary | ICD-10-CM | POA: Insufficient documentation

## 2013-08-13 LAB — GLUCOSE, CAPILLARY: GLUCOSE-CAPILLARY: 90 mg/dL (ref 70–99)

## 2013-08-13 NOTE — Progress Notes (Signed)
[facility]  NAME:  DOC, MANDALA NO.:  [id number]  MEDICAL RECORD NO.:  [id number]      DATE OF BIRTH:  [birth year]  PHYSICIAN:  Judene Companion, M.D.      VISIT DATE:  08/13/2013                                  OFFICE VISIT   This is a 78 year old diabetic man who has dementia and lives in a nursing home.  He came here today with an ulcer on his left heel that looked to be from pressure.  His blood pressure is 106/69, pulse 65, temperature 97.  He weighs 181 pounds.  He was brought here by his wife. His wife tells [place] that he is in a nursing home, but that she goes by and sees him almost daily.  His medications include Lipitor, Celexa, Aricept, Namenda, Phenergan and Rifadin.  He apparently has a great deal of problems with arthritis and apparently spent a prolonged time in bed and developed a discolored heel which was the entire circumference of the heel.  This since has sloughed off the epidermis and the dermis appears viable.  Today, I just took off the last of the scabs on the epidermis.  We put some silver collagen and a heel cup and we sent him home with a Podus boot.  I think if he just off loads and the Podus boot will help him in that regard.  DIAGNOSES: 1. Pressure ulcer, stage I of his left heel. 2. Diabetes. 3. Hypertension. 4. Alzheimer's.     Judene Companion, M.D.     PP/MEDQ  D:  08/13/2013  T:  08/13/2013  Job:  [id number]

## 2013-08-16 ENCOUNTER — Ambulatory Visit (HOSPITAL_COMMUNITY): Payer: Managed Care, Other (non HMO) | Attending: Internal Medicine

## 2013-08-16 ENCOUNTER — Other Ambulatory Visit (HOSPITAL_COMMUNITY): Payer: Self-pay | Admitting: Cardiology

## 2013-08-16 DIAGNOSIS — E1159 Type 2 diabetes mellitus with other circulatory complications: Secondary | ICD-10-CM

## 2013-08-16 DIAGNOSIS — M25469 Effusion, unspecified knee: Secondary | ICD-10-CM | POA: Insufficient documentation

## 2013-08-16 DIAGNOSIS — L8990 Pressure ulcer of unspecified site, unspecified stage: Secondary | ICD-10-CM

## 2013-08-21 ENCOUNTER — Encounter (HOSPITAL_COMMUNITY): Payer: Self-pay | Admitting: Emergency Medicine

## 2013-08-21 ENCOUNTER — Emergency Department (HOSPITAL_COMMUNITY): Payer: Managed Care, Other (non HMO)

## 2013-08-21 ENCOUNTER — Emergency Department (HOSPITAL_COMMUNITY)
Admission: EM | Admit: 2013-08-21 | Discharge: 2013-08-21 | Disposition: A | Discharge status: Skilled Nursing Facility | Payer: Managed Care, Other (non HMO) | Attending: Emergency Medicine | Admitting: Emergency Medicine

## 2013-08-21 DIAGNOSIS — F0390 Unspecified dementia without behavioral disturbance: Secondary | ICD-10-CM | POA: Insufficient documentation

## 2013-08-21 DIAGNOSIS — E119 Type 2 diabetes mellitus without complications: Secondary | ICD-10-CM | POA: Insufficient documentation

## 2013-08-21 DIAGNOSIS — M25469 Effusion, unspecified knee: Secondary | ICD-10-CM

## 2013-08-21 DIAGNOSIS — M7989 Other specified soft tissue disorders: Secondary | ICD-10-CM

## 2013-08-21 DIAGNOSIS — Z79899 Other long term (current) drug therapy: Secondary | ICD-10-CM | POA: Insufficient documentation

## 2013-08-21 DIAGNOSIS — Z859 Personal history of malignant neoplasm, unspecified: Secondary | ICD-10-CM | POA: Insufficient documentation

## 2013-08-21 DIAGNOSIS — F329 Major depressive disorder, single episode, unspecified: Secondary | ICD-10-CM | POA: Insufficient documentation

## 2013-08-21 DIAGNOSIS — F3289 Other specified depressive episodes: Secondary | ICD-10-CM | POA: Insufficient documentation

## 2013-08-21 DIAGNOSIS — M79606 Pain in leg, unspecified: Secondary | ICD-10-CM

## 2013-08-21 HISTORY — DX: Type 2 diabetes mellitus without complications: E11.9

## 2013-08-21 LAB — HEPATIC FUNCTION PANEL
ALBUMIN: 3.6 g/dL (ref 3.5–5.2)
ALK PHOS: 67 U/L (ref 39–117)
ALT: 18 U/L (ref 0–53)
AST: 31 U/L (ref 0–37)
Bilirubin, Direct: <0.2 mg/dL (ref 0.0–0.3)
TOTAL PROTEIN: 7.9 g/dL (ref 6.0–8.3)
Total Bilirubin: 0.5 mg/dL (ref 0.3–1.2)

## 2013-08-21 LAB — CBC
HCT: 40.3 % (ref 39.0–52.0)
Hemoglobin: 14.3 g/dL (ref 13.0–17.0)
MCH: 32.4 pg (ref 26.0–34.0)
MCHC: 35.5 g/dL (ref 30.0–36.0)
MCV: 91.4 fL (ref 78.0–100.0)
Platelets: 243 10*3/uL (ref 150–400)
RBC: 4.41 MIL/uL (ref 4.22–5.81)
RDW: 13.4 % (ref 11.5–15.5)
WBC: 9.1 10*3/uL (ref 4.0–10.5)

## 2013-08-21 LAB — BASIC METABOLIC PANEL
BUN: 9 mg/dL (ref 6–23)
CO2: 24 meq/L (ref 19–32)
Calcium: 9.6 mg/dL (ref 8.4–10.5)
Chloride: 96 mEq/L (ref 96–112)
Creatinine, Ser: 0.96 mg/dL (ref 0.50–1.35)
GFR calc Af Amer: >90 mL/min (ref >90)
GFR calc non Af Amer: 79 mL/min — ABNORMAL LOW (ref >90)
Glucose, Bld: 156 mg/dL — ABNORMAL HIGH (ref 70–99)
Potassium: 4.4 mEq/L (ref 3.7–5.3)
SODIUM: 134 meq/L — AB (ref 137–147)

## 2013-08-21 MED ORDER — PREDNISONE 20 MG PO TABS: ORAL_TABLET | ORAL | Status: DC

## 2013-08-21 MED ORDER — HYDROCODONE-ACETAMINOPHEN 5-325 MG PO TABS: ORAL_TABLET | ORAL | Status: DC

## 2013-08-21 MED ORDER — HYDROCODONE-ACETAMINOPHEN 5-325 MG PO TABS
1.0000 | ORAL_TABLET | Freq: Once | ORAL | Status: AC
  Administered 2013-08-21: 1 via ORAL
  Filled 2013-08-21: qty 1

## 2013-08-21 NOTE — ED Notes (Signed)
6 ace wrap applied to right knee.

## 2013-08-21 NOTE — ED Provider Notes (Deleted)
Medical screening examination/treatment/procedure(s) were performed by non-physician practitioner and as supervising physician I was immediately available for consultation/collaboration.  Carmelle Bamberg L Benuel Ly, MD 08/21/13 2005 

## 2013-08-21 NOTE — ED Notes (Signed)
Geiple PA at bedside.

## 2013-08-21 NOTE — Progress Notes (Signed)
VASCULAR LAB PRELIMINARY  PRELIMINARY  PRELIMINARY  PRELIMINARY  Bilateral lower extremity venous Dopplers completed.    Preliminary report:  There is no DVT or SVT noted in the bilateral lower extremities.  Gladis Soley, RVT 08/21/2013, 2:37 PM

## 2013-08-21 NOTE — ED Provider Notes (Signed)
CSN: [id number]     Arrival date & time 08/21/13  1246 History   First MD Initiated Contact with Patient 08/21/13 1332     Chief Complaint  Patient presents with  . Leg Swelling     (Consider location/radiation/quality/duration/timing/severity/associated sxs/prior Treatment) HPI Comments: Patient with history of dementia -- presents with worsening bilateral lower extremity edema and R knee swelling. No known history of liver or kidney problems. No known h/o MI/CHF. No recent fever. Patient is acting like he is in pain per son who is at bedside. Son gives limited history. Level V caveat 2/2 dementia.   The history is provided by a relative, the nursing home and medical records.    Past Medical History  Diagnosis Date  . Depression   . Dementia   . Diabetes mellitus without complication   . Cancer    History reviewed. No pertinent past surgical history. History reviewed. No pertinent family history. History  Substance Use Topics  . Smoking status: Never Smoker   . Smokeless tobacco: Never Used  . Alcohol Use: No    Review of Systems  Unable to perform ROS: Dementia      Allergies  Review of patient's allergies indicates no known allergies.  Home Medications   Current Outpatient Rx  Name  Route  Sig  Dispense  Refill  . atorvastatin (LIPITOR) 10 MG tablet   Oral   Take 10 mg by mouth daily.         . citalopram (CELEXA) 20 MG tablet   Oral   Take 20 mg by mouth daily.         Marland Kitchen donepezil (ARICEPT) 10 MG tablet   Oral   Take 10 mg by mouth at bedtime as needed.         . memantine (NAMENDA) 10 MG tablet   Oral   Take 10 mg by mouth 2 (two) times daily.         . promethazine (PHENERGAN) 25 MG tablet   Oral   Take 1 tablet (25 mg total) by mouth every 6 (six) hours as needed for nausea.   30 tablet   3   . rifampin (RIFADIN) 300 MG capsule      TAKE ONE CAPSULE BY MOUTH TWICE A DAY   60 capsule   0    BP 127/72  Pulse 86  Temp(Src) 99.3 F  (37.4 C) (Oral)  Resp 20  SpO2 98% Physical Exam  Nursing note and vitals reviewed. Constitutional: He appears well-developed and well-nourished.  HENT:  Head: Normocephalic and atraumatic.  Eyes: Conjunctivae are normal. Right eye exhibits no discharge. Left eye exhibits no discharge.  Neck: Normal range of motion. Neck supple.  Cardiovascular: Normal rate, regular rhythm and normal heart sounds.   Pulmonary/Chest: Effort normal and breath sounds normal. No respiratory distress. He has no wheezes. He has no rales.  Abdominal: Soft. There is no tenderness.  Musculoskeletal: He exhibits edema and tenderness.       Right knee: He exhibits swelling and effusion. Tenderness (grimaces with movement) found.       Left knee: Normal.       Right ankle: He exhibits swelling. Tenderness.       Left ankle: He exhibits swelling. Tenderness.       Right lower leg: He exhibits tenderness, swelling (non-pitting) and edema.       Left lower leg: He exhibits tenderness, swelling and edema (non-pitting).       Right foot: He  exhibits tenderness.       Left foot: He exhibits tenderness.  Significant R knee effusion. Significant scaling bilateral feet. Toenail fungal infections. Mild erythema R foot. Decubitus ulcer L heel, no drainage.   Neurological: He exhibits normal muscle tone.  Skin: Skin is warm and dry.  Psychiatric: He has a normal mood and affect.    ED Course  Procedures (including critical care time) Labs Review Labs Reviewed  BASIC METABOLIC PANEL - Abnormal; Notable for the following:    Sodium 134 (*)    Glucose, Bld 156 (*)    GFR calc non Af Amer 79 (*)    All other components within normal limits  CBC  HEPATIC FUNCTION PANEL   Imaging Review Dg Knee Right Port  08/21/2013   CLINICAL DATA:  Right leg swelling.  EXAM: PORTABLE RIGHT KNEE - 1-2 VIEW  COMPARISON:  None.  FINDINGS: No fracture or dislocation.  There is a large suprapatellar joint effusion. There are advanced  arthropathic changes with significant joint space narrowing most prominent of the lateral joint space compartment, subchondral sclerosis as well as marginal osteophytes. This is also prominent involving the patella.  Bones are demineralized.  IMPRESSION: No fracture or dislocation.  Advanced arthropathic changes. Although this may be osteoarthritis, CPPD arthropathy should be considered given the pattern. There is a large joint effusion.   Electronically Signed   By: Lajean Manes M.D.   On: 08/21/2013 14:38     EKG Interpretation None      1:50 PM Patient seen and examined. Work-up initiated. Medications ordered.   Vital signs reviewed and are as follows: Filed Vitals:   08/21/13 1254  BP: 127/72  Pulse: 86  Temp: 99.3 F (37.4 C)  Resp: 20   3:11 PM Results reviewed, discussed with and seen by Dr. Eulis Foster.   Doppler neg for DVT.   Will treat with ACE, prednisone, pain medication.   He sees Dr. Percell Miller, will refer to ortho.    MDM   Final diagnoses:  Knee effusion  Pain and swelling of lower extremity   Knee effusion: likely 2/2 osteo, cannot entirely discount gout or pseudogout. Knee is not warm or red. No fever. Do not suspect septic arthritis given overall picture. Patient appears non-toxic.   Lower extremity swelling: neg for DVT, do not suspect liver failure, nephrotic syndrome, CHF. No signs of cellulitis. Patient can f/u with PCP.   No dangerous or life-threatening conditions suspected or identified by history, physical exam, and by work-up. No indications for hospitalization identified.     Carlisle Cater, PA-C 08/21/13 1517

## 2013-08-21 NOTE — Discharge Instructions (Signed)
Please read and follow all provided instructions.  Your diagnoses today include:  1. Knee effusion   2. Pain and swelling of lower extremity    Tests performed today include:  Vital signs. See below for your results today.   Ultrasound - no blood clots in veins of legs  Blood counts and electrolytes - normal  X-ray of knee -- shows swelling in joint, severe arthritis  Medications prescribed:   Prednisone - steroid medicine   It is best to take this medication in the morning to prevent sleeping problems. If you are diabetic, monitor your blood sugar closely and stop taking Prednisone if blood sugar is over 300. Take with food to prevent stomach upset.    Vicodin (hydrocodone/acetaminophen) - narcotic pain medication  DO NOT drive or perform any activities that require you to be awake and alert because this medicine can make you drowsy. BE VERY CAREFUL not to take multiple medicines containing Tylenol (also called acetaminophen). Doing so can lead to an overdose which can damage your liver and cause liver failure and possibly death.  Use pain medication only under direct supervision at the lowest possible dose needed to control your pain.   Take any prescribed medications only as directed.  Home care instructions:  Follow any educational materials contained in this packet.  BE VERY CAREFUL not to take multiple medicines containing Tylenol (also called acetaminophen). Doing so can lead to an overdose which can damage your liver and cause liver failure and possibly death.   Follow-up instructions: Please follow-up with your primary care provider in the next 3 days for further evaluation of your symptoms. If you do not have a primary care doctor -- see below for referral information.   Return instructions:   Please return to the Emergency Department if you experience worsening symptoms.   Please return if you have any other emergent concerns.  Additional Information:  Your  vital signs today were: BP 127/72   Pulse 86   Temp(Src) 99.3 F (37.4 C) (Oral)   Resp 20   SpO2 98% If your blood pressure (BP) was elevated above 135/85 this visit, please have this repeated by your doctor within one month. --------------

## 2013-08-21 NOTE — ED Provider Notes (Signed)
  Face-to-face evaluation   History: He is here for evaluation of pain and swelling primarily right leg. No known recent trauma. History chronic osteoarthritis both knees, felt to require replacement, but he is not a candidate for that.  Physical exam: Elderly man in mild pain. Right leg with large knee effusion, and tenderness with passive range of motion. Right ankle is slightly swollen with mild tenderness to palpation. No calf tenderness on either leg.  Medical screening examination/treatment/procedure(s) were conducted as a shared visit with non-physician practitioner(s) and myself.  I personally evaluated the patient during the encounter  Richarda Blade, MD 08/23/13 [phone]

## 2013-08-21 NOTE — ED Notes (Addendum)
pts wife went to visit him at memory care today and he wasn't acting like himself and she noticed his R leg was swollen from his knee to his foot. Wife states the pt keeps rubbing the leg and shakes when you touch the leg. Wife said pt had an ultrasound of leg last week for this but symptoms seemed worse today

## 2013-08-22 ENCOUNTER — Telehealth (HOSPITAL_COMMUNITY): Payer: Self-pay | Admitting: *Deleted

## 2013-08-22 NOTE — ED Notes (Signed)
Med tech from [facility] called to say pt's script for Vicodin cannot have a dosage ragne (Current Rx reads Vicodin 5/325 1/2-1 tab q 6hrs PRN for mod to severe pain).  I spoke with MD Sharol Given and she changed the Rx to say Vicodin 5/325, 0.5 tab PO q 4 hrs PRN Pain written at 06:15 and faxed at 06:30 to [facility] at [phone]

## 2013-09-03 ENCOUNTER — Encounter (HOSPITAL_BASED_OUTPATIENT_CLINIC_OR_DEPARTMENT_OTHER): Payer: Medicare Other | Attending: General Surgery

## 2013-09-16 ENCOUNTER — Non-Acute Institutional Stay (SKILLED_NURSING_FACILITY): Payer: Managed Care, Other (non HMO) | Admitting: Internal Medicine

## 2013-09-16 ENCOUNTER — Other Ambulatory Visit: Payer: Self-pay | Admitting: *Deleted

## 2013-09-16 ENCOUNTER — Encounter: Payer: Self-pay | Admitting: Internal Medicine

## 2013-09-16 DIAGNOSIS — Z227 Latent tuberculosis: Secondary | ICD-10-CM | POA: Insufficient documentation

## 2013-09-16 DIAGNOSIS — R5383 Other fatigue: Secondary | ICD-10-CM

## 2013-09-16 DIAGNOSIS — I82409 Acute embolism and thrombosis of unspecified deep veins of unspecified lower extremity: Secondary | ICD-10-CM

## 2013-09-16 DIAGNOSIS — E119 Type 2 diabetes mellitus without complications: Secondary | ICD-10-CM

## 2013-09-16 DIAGNOSIS — F0393 Unspecified dementia, unspecified severity, with mood disturbance: Secondary | ICD-10-CM

## 2013-09-16 DIAGNOSIS — F0280 Dementia in other diseases classified elsewhere without behavioral disturbance: Secondary | ICD-10-CM | POA: Insufficient documentation

## 2013-09-16 DIAGNOSIS — M1990 Unspecified osteoarthritis, unspecified site: Secondary | ICD-10-CM | POA: Insufficient documentation

## 2013-09-16 DIAGNOSIS — F329 Major depressive disorder, single episode, unspecified: Secondary | ICD-10-CM

## 2013-09-16 DIAGNOSIS — F3289 Other specified depressive episodes: Secondary | ICD-10-CM

## 2013-09-16 DIAGNOSIS — R7611 Nonspecific reaction to tuberculin skin test without active tuberculosis: Secondary | ICD-10-CM

## 2013-09-16 DIAGNOSIS — G309 Alzheimer's disease, unspecified: Secondary | ICD-10-CM

## 2013-09-16 DIAGNOSIS — R531 Weakness: Secondary | ICD-10-CM | POA: Insufficient documentation

## 2013-09-16 DIAGNOSIS — R5381 Other malaise: Secondary | ICD-10-CM

## 2013-09-16 HISTORY — DX: Unspecified osteoarthritis, unspecified site: M19.90

## 2013-09-16 HISTORY — DX: Dementia in other diseases classified elsewhere without behavioral disturbance: F02.80

## 2013-09-16 HISTORY — DX: Latent tuberculosis: Z22.7

## 2013-09-16 HISTORY — DX: Acute embolism and thrombosis of unspecified deep veins of unspecified lower extremity: I82.409

## 2013-09-16 HISTORY — DX: Unspecified dementia, unspecified severity, with mood disturbance: F03.93

## 2013-09-16 MED ORDER — HYDROCODONE-ACETAMINOPHEN 5-325 MG PO TABS: ORAL_TABLET | ORAL | Status: DC

## 2013-09-16 NOTE — Progress Notes (Signed)
Patient ID: Derrick Rubio, male   DOB: Dec 15, 1935, 78 y.o.   MRN: [id number]     [facility]  PCP: Kandice Hams, MD  No Known Allergies  Chief Complaint: new admit  HPI:  78 y/o male patient is here for STR after hospital admission with AMS and sepsis. He was noted to have right leg DVT and underwent inferior venacava filter placement. He had altered mental status, hypernatremia and AKI at time of admission and now has returned to his baseline. He was in the hospital from 08/30/13- 09/08/13 and was then in another SNF for 1 week. He is transferred her for STR. He was seen in his room with his son present. He has some confusion at baseline but is calm this visit. He complaints of ocassional joint pain in his knees. No other complaints. No falls in the facility. Therapy team will be evaluating him today for rehabilitation  Review of Systems:  Constitutional: Negative for fever, chills, diaphoresis. feels tired HENT: Negative for congestion, hearing loss and sore throat.   Eyes: Negative for eye pain, blurred vision, double vision and discharge.  Respiratory: Negative for cough, sputum production, shortness of breath and wheezing.   Cardiovascular: Negative for chest pain, palpitations, orthopnea and leg swelling.  Gastrointestinal: Negative for heartburn, nausea, vomiting, abdominal pain, diarrhea and constipation.  Genitourinary: Negative for dysuria, urgency, frequency, hematuria and flank pain.  Musculoskeletal: Negative for back pain, falls but is a high fall risk  Skin: Negative for itching and rash.  Neurological: Negative for dizziness, tingling, focal weakness and headaches.  Psychiatric/Behavioral: has memory loss. The patient is not nervous/anxious.     Past Medical History  Diagnosis Date  . Depression   . Dementia   . Diabetes mellitus without complication   . Cancer    No past surgical history on file. Social History:   reports that he has never  smoked. He has never used smokeless tobacco. He reports that he does not drink alcohol or use illicit drugs.  No family history on file.  Medications: Patient's Medications  New Prescriptions   No medications on file  Previous Medications   ACETAMINOPHEN (TYLENOL) 500 MG TABLET    Take 1,000 mg by mouth every 4 (four) hours as needed for mild pain or fever.   ASPIRIN EC 81 MG TABLET    Take 81 mg by mouth daily.   ATORVASTATIN (LIPITOR) 10 MG TABLET    Take 10 mg by mouth daily.   CITALOPRAM (CELEXA) 20 MG TABLET    Take 20 mg by mouth daily.   DONEPEZIL (ARICEPT) 10 MG TABLET    Take 10 mg by mouth at bedtime.    HYDROCODONE-ACETAMINOPHEN (NORCO/VICODIN) 5-325 MG PER TABLET    Take one tablet by mouth every 4 hours as needed for pain   LATANOPROST (XALATAN) 0.005 % OPHTHALMIC SOLUTION    Place 1 drop into both eyes at bedtime.   MELOXICAM (MOBIC) 15 MG TABLET    Take 15 mg by mouth daily.   MEMANTINE (NAMENDA) 10 MG TABLET    Take 10 mg by mouth 2 (two) times daily.   METFORMIN (GLUCOPHAGE) 500 MG TABLET    Take 500 mg by mouth every evening.   PREDNISONE (DELTASONE) 20 MG TABLET    3 Tabs PO Days 1-3, then 2 tabs PO Days 4-6, then 1 tab PO Day 7-9, then Half Tab PO Day 10-12   RIFAMPIN (RIFADIN) 300 MG CAPSULE    Take 300  mg by mouth 2 (two) times daily.  Modified Medications   No medications on file  Discontinued Medications   No medications on file     Physical Exam: Filed Vitals:   09/16/13 1134  BP: 112/62  Pulse: 68  Temp: 98.4 F (36.9 C)  Resp: 18  Weight: 150 lb (68.04 kg)  SpO2: 96%    General- elderly male in no acute distress, thin, frail Head- atraumatic, normocephalic Eyes- PERRLA, EOMI, no pallor, no icterus, no discharge Neck- no lymphadenopathy, no thyromegaly, no jugular vein distension, no carotid bruit Throat- moist mucus membrane, normal oropharynx Nose- normal nasal mucosa, no maxillary or frontal sinus tenderness Cardiovascular- normal s1,s2, no  murmurs/ rubs/ gallops Respiratory- bilateral clear to auscultation, no wheeze, no rhonchi, no crackles, no use of accessory muscles Abdomen- bowel sounds present, soft, non tender Musculoskeletal- able to move all 4 extremities, no spinal and paraspinal tenderness, no leg edema, on wheelchair, generalized weakness Neurological- no focal deficit Skin- warm and dry Psychiatry- alert and oriented to person with somewhat flat affect    Labs reviewed: Basic Metabolic Panel:  Recent Labs  04/01/13 1651 06/01/13 1504 08/21/13 1325  NA 138 134* 134*  K 4.5 4.2 4.4  CL 101 100 96  CO2 29 27 24   GLUCOSE 93 111* 156*  BUN 15 11 9   CREATININE 1.12 1.04 0.96  CALCIUM 9.4 9.0 9.6   Liver Function Tests:  Recent Labs  04/01/13 1651 08/21/13 1351  AST 27 31  ALT 27 18  ALKPHOS 83 67  BILITOT 0.3 0.5  PROT 7.7 7.9  ALBUMIN 4.5 3.6   No results found for this basename: LIPASE, AMYLASE,  in the last 8760 hours No results found for this basename: AMMONIA,  in the last 8760 hours CBC:  Recent Labs  06/01/13 1504 08/21/13 1325  WBC 4.2 9.1  NEUTROABS 2.6  --   HGB 13.8 14.3  HCT 39.8 40.3  MCV 92.3 91.4  PLT 183 243   Cardiac Enzymes:  Recent Labs  06/01/13 1504  TROPONINI <0.30   BNP: No components found with this basename: POCBNP,  CBG:  Recent Labs  08/13/13 1022  GLUCAP 90   09/08/13 wbc 9, hb 12.9, hct 38.7, plt 515, na 140, k 3.9, bun 16, cr 0.9, glu 12, mg 1.9  Assessment/Plan  dvt- s/p IVC filter placement. Continue baby aspirin  Generalized weakness- in setting of recent sepsis, memory loss and deconditioning. Will have him work with PT and OT team as tolerated. Fall precautions  Dementia- Change namenda from 10 mg daily to namenda xr 14 mg daily. Continue aricept. Skin care and assistance with ADLS  OA- persists. Will give him mobic, prn noroc and tylenol. Pain med prior to initiating therapy to help with participation. Fall  precautions  Depression- continue celexa 20 mg daily, monitor for behavior changes  Dm- check a1c. Monitor cbg. Continue metformin for now. Continue statin and asa. Check renal function  Latent tb- on rifampin 300 mg tid, not clear about the start date. Will need prior records for review. Will not make any changes until further record review  Family/ staff Communication: reviewed care plan with patient and nursing supervisor   Goals of care: STR   Labs/tests ordered- cbc, cmp, a1c, lipid panel    Blanchie Serve, MD  [facility] [phone] (Monday-Friday 8 am - 5 pm) [phone] (afterhours)

## 2013-09-16 NOTE — Telephone Encounter (Signed)
[facility] 

## 2013-10-18 ENCOUNTER — Non-Acute Institutional Stay (SKILLED_NURSING_FACILITY): Payer: Managed Care, Other (non HMO) | Admitting: Internal Medicine

## 2013-10-18 DIAGNOSIS — E7800 Pure hypercholesterolemia, unspecified: Secondary | ICD-10-CM

## 2013-10-18 DIAGNOSIS — M1990 Unspecified osteoarthritis, unspecified site: Secondary | ICD-10-CM

## 2013-10-18 DIAGNOSIS — F0280 Dementia in other diseases classified elsewhere without behavioral disturbance: Secondary | ICD-10-CM

## 2013-10-18 DIAGNOSIS — E119 Type 2 diabetes mellitus without complications: Secondary | ICD-10-CM | POA: Insufficient documentation

## 2013-10-18 DIAGNOSIS — IMO0001 Reserved for inherently not codable concepts without codable children: Secondary | ICD-10-CM

## 2013-10-18 DIAGNOSIS — E1165 Type 2 diabetes mellitus with hyperglycemia: Principal | ICD-10-CM

## 2013-10-18 DIAGNOSIS — G309 Alzheimer's disease, unspecified: Secondary | ICD-10-CM

## 2013-10-18 HISTORY — DX: Pure hypercholesterolemia, unspecified: E78.00

## 2013-10-18 NOTE — Progress Notes (Signed)
         PROGRESS NOTE  DATE: 10/18/2013  FACILITY: Nursing Home Location: [facility]  LEVEL OF CARE: SNF (31)  Routine Visit  CHIEF COMPLAINT:  Manage dementia, hyperlipidemia and diabetes mellitus  HISTORY OF PRESENT ILLNESS:  REASSESSMENT OF ONGOING PROBLEM(S):  DEMENTIA: The dementia remaines stable and continues to function adequately in the current living environment with supervision.  The patient has had little changes in behavior. No complications noted from the medications presently being used. Patient is a poor historian.  DM:pt's DM remains stable.  Staff deny polyuria, polydipsia, polyphagia, changes in vision or hypoglycemic episodes.  No complications noted from the medication presently being used.  Last hemoglobin A1c is: 7.7 in 4-15.  HYPERLIPIDEMIA: No complications from the medications presently being used. In 4-15 fasting lipid panel normal.  PAST MEDICAL HISTORY : Reviewed.  No changes/see problem list  CURRENT MEDICATIONS: Reviewed per MAR/see medication list  REVIEW OF SYSTEMS: Unobtainable due to dementia  PHYSICAL EXAMINATION  VS:  See VS section  GENERAL: no acute distress, normal body habitus EYES: conjunctivae normal, sclerae normal, normal eye lids NECK: supple, trachea midline, no neck masses, no thyroid tenderness, no thyromegaly LYMPHATICS: no LAN in the neck, no supraclavicular LAN RESPIRATORY: breathing is even & unlabored, BS CTAB CARDIAC: RRR, no murmur,no extra heart sounds, no edema GI: abdomen soft, normal BS, no masses, no tenderness, no hepatomegaly, no splenomegaly PSYCHIATRIC: the patient is alert & disoriented, affect & behavior appropriate  LABS/RADIOLOGY:  4-15 platelets 444 otherwise CBC normal, glucose 136, albumin 3.4 otherwise CMP normal  ASSESSMENT/PLAN:  Hyperlipidemia-well-controlled Diabetes mellitus-uncontrolled. Increase metformin to 500 mg twice a day Dementia-advanced. Check TSH and  vitamin B12 level. Osteoarthritis-continue pain medications Depression-continue Celexa  CPT CODE: 52841  Darrill Vreeland Y Bina Veenstra, MD [facility] [phone]

## 2013-11-01 LAB — HEPATIC FUNCTION PANEL
ALT: 13 U/L (ref 10–40)
AST: 14 U/L (ref 14–40)
Alkaline Phosphatase: 53 U/L (ref 25–125)

## 2013-11-01 LAB — BASIC METABOLIC PANEL
BUN: 10 mg/dL (ref 4–21)
Creatinine: 0.8 mg/dL (ref 0.6–1.3)
GLUCOSE: 97 mg/dL
POTASSIUM: 4 mmol/L (ref 3.4–5.3)
Sodium: 136 mmol/L — AB (ref 137–147)

## 2013-11-01 LAB — CBC AND DIFFERENTIAL
HEMATOCRIT: 37 % — AB (ref 41–53)
Hemoglobin: 12.8 g/dL — AB (ref 13.5–17.5)
PLATELETS: 241 10*3/uL (ref 150–399)
WBC: 8.5 10*3/mL

## 2013-11-02 ENCOUNTER — Non-Acute Institutional Stay (SKILLED_NURSING_FACILITY): Payer: Managed Care, Other (non HMO) | Admitting: Adult Health

## 2013-11-02 ENCOUNTER — Telehealth: Payer: Self-pay | Admitting: *Deleted

## 2013-11-02 ENCOUNTER — Encounter: Payer: Self-pay | Admitting: Adult Health

## 2013-11-02 DIAGNOSIS — F3289 Other specified depressive episodes: Secondary | ICD-10-CM

## 2013-11-02 DIAGNOSIS — G309 Alzheimer's disease, unspecified: Secondary | ICD-10-CM

## 2013-11-02 DIAGNOSIS — H409 Unspecified glaucoma: Secondary | ICD-10-CM | POA: Insufficient documentation

## 2013-11-02 DIAGNOSIS — Z227 Latent tuberculosis: Secondary | ICD-10-CM

## 2013-11-02 DIAGNOSIS — E1165 Type 2 diabetes mellitus with hyperglycemia: Secondary | ICD-10-CM

## 2013-11-02 DIAGNOSIS — IMO0001 Reserved for inherently not codable concepts without codable children: Secondary | ICD-10-CM

## 2013-11-02 DIAGNOSIS — F0393 Unspecified dementia, unspecified severity, with mood disturbance: Secondary | ICD-10-CM

## 2013-11-02 DIAGNOSIS — F0280 Dementia in other diseases classified elsewhere without behavioral disturbance: Secondary | ICD-10-CM

## 2013-11-02 DIAGNOSIS — F329 Major depressive disorder, single episode, unspecified: Secondary | ICD-10-CM

## 2013-11-02 DIAGNOSIS — R7611 Nonspecific reaction to tuberculin skin test without active tuberculosis: Secondary | ICD-10-CM

## 2013-11-02 DIAGNOSIS — E7800 Pure hypercholesterolemia, unspecified: Secondary | ICD-10-CM

## 2013-11-02 DIAGNOSIS — M1990 Unspecified osteoarthritis, unspecified site: Secondary | ICD-10-CM

## 2013-11-02 NOTE — Progress Notes (Signed)
Patient ID: Derrick Rubio, male   DOB: [birth year], 78 y.o.   MRN: [id number]     [facility]  No Known Allergies   Chief Complaint  Patient presents with  . Acute Visit    follow up transfer    HPI:  Derrick Rubio has been transferred from another snf to this facility. This does represent a long term placement for him. There are no concerns being voiced by the nursing staff at this time. Derrick Rubio tells me that Derrick Rubio is feeling good today. His family is at his bedside.  Derrick Rubio is being treated for latent tb with rifampin 300 mg tid. This medication was ordered for 2 months in Jan of this year; however; Derrick Rubio remains on this medication. I have spoken with Dr. Storm Frisk nurse. She will call me back to inform me if and when this medication can be stopped and what other testing may be necessary.    Past Medical History  Diagnosis Date  . Depression   . Dementia   . Diabetes mellitus without complication   . Cancer   . Latent tuberculosis 09/16/2013  . DVT (deep venous thrombosis) 09/16/2013  . 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 Osteoarthritis 09/16/2013  . Pure hypercholesterolemia 10/18/2013    No past surgical history on file.  VITAL SIGNS BP 137/75  Pulse 84  Ht 6' (1.829 m)  Wt 161 lb 3.2 oz (73.12 kg)  BMI 21.86 kg/m2   Patient's Medications  New Prescriptions   No medications on file  Previous Medications   ACETAMINOPHEN (TYLENOL) 500 MG TABLET    Take 500 mg by mouth every 4 (four) hours as needed for mild pain or fever.    ASPIRIN EC 81 MG TABLET    Take 81 mg by mouth daily.   ATORVASTATIN (LIPITOR) 10 MG TABLET    Take 10 mg by mouth daily.   CITALOPRAM (CELEXA) 20 MG TABLET    Take 20 mg by mouth daily.   DONEPEZIL (ARICEPT) 10 MG TABLET    Take 10 mg by mouth at bedtime.    HYDROCODONE-ACETAMINOPHEN (NORCO/VICODIN) 5-325 MG PER TABLET    Take one tablet by mouth every 4 hours as needed for pain   LATANOPROST (XALATAN) 0.005 % OPHTHALMIC SOLUTION    Place 1 drop into both eyes at  bedtime.   MELOXICAM (MOBIC) 15 MG TABLET    Take 15 mg by mouth daily.   MEMANTINE HCL ER (NAMENDA XR) 14 MG CP24    Take 14 mg by mouth daily.   METFORMIN (GLUCOPHAGE) 500 MG TABLET    Take 500 mg by mouth 2 (two) times daily with a meal.    RIFAMPIN (RIFADIN) 300 MG CAPSULE    Take 300 mg by mouth 3 (three) times daily.   Modified Medications   No medications on file  Discontinued Medications   MEMANTINE (NAMENDA) 10 MG TABLET    Take 10 mg by mouth daily.     SIGNIFICANT DIAGNOSTIC EXAMS  08-21-13: bilateral lower extremity doppler: No evidence of deep vein or superficial thrombosis involving the right lower extremity and left lower extremity. - No evidence of Baker's cyst on the right or left.  08-21-13: right knee x-ray: No fracture or dislocation. Advanced arthropathic changes. Although this may be osteoarthritis, CPPD arthropathy should be considered given the pattern. There is a large joint effusion.    LABS REVIEWED:   08-21-13: wbc 9.1; hgb 14.3; hct 40.3; mcv 91.4 plt 243; glucose 156; bun 9; creat 0.96;  k+4.4; na++134; liver normal albumin 3.6     Review of Systems  Constitutional: Negative for malaise/fatigue.  Respiratory: Negative for cough and shortness of breath.   Cardiovascular: Negative for chest pain.  Gastrointestinal: Negative for heartburn and abdominal pain.  Musculoskeletal: Negative for joint pain and myalgias.  Skin: Negative.   Psychiatric/Behavioral: The patient is not nervous/anxious and does not have insomnia.       Physical Exam  Constitutional: No distress.  thin  Neck: Neck supple. No JVD present. No thyromegaly present.  Cardiovascular: Normal rate, regular rhythm and intact distal pulses.   Respiratory: Effort normal and breath sounds normal. No respiratory distress. Derrick Rubio has no wheezes.  GI: Soft. Bowel sounds are normal. Derrick Rubio exhibits no distension. There is no tenderness.  Musculoskeletal: Normal range of motion. Derrick Rubio exhibits no edema.    Neurological: Derrick Rubio is alert.  Skin: Skin is warm and dry. Derrick Rubio is not diaphoretic.  Psychiatric: Derrick Rubio has a normal mood and affect.      ASSESSMENT/ PLAN:  1. Latent TB: Derrick Rubio is presently on rifampin 300 mg three times daily. Will not make changes at this time; will await I/D call and will continue to monitor his status.   2. Alzheimer's disease: no change in status: will continue namenda xr 14 mg daily and aricept 10 mg nightly asa 81 mg daily and will monitor his status.   3. Diabetes: is stable will continue metformin 500 mg twice daily and will monitor his status   4. Dyslipidemia: will continue lipitor 10 mg daily   5. Depression: his mood state is stable will continue celexa 20 mg daily will monitor   6. Osteoarthritis: is stable will continue mobic 15 mg daily and vicodin 5/325 mg every 4 hours as needed will monitor   7. Glaucoma: will continue xalatan to both eyes nightly    Time spent with patient      Ok Edwards NP [facility]  Contact [phone] Monday through Friday 8am- 5pm  After hours call [phone]

## 2013-11-02 NOTE — Telephone Encounter (Signed)
Received call from Bard Herbert, NP.  Patient has transferred to [facility], had Rifampin 300 mg TID as active on his MAR.  Pt was supposed to take Rifampin 300 mg BID for 2 months, stopping 09/05/13.  Per Dr. Baxter Flattery, patient needs to stop rifampin today.  Jackelyn Poling will fax CMP results to [place] this week. Landis Gandy, RN

## 2013-11-03 NOTE — Progress Notes (Signed)
Patient ID: Derrick Rubio, male   DOB: 1936/01/11, 78 y.o.   MRN: [id number]  I had spoken with I/D regarding his rifampin. Will stop this medication as of 11-02-13 at 5;10pm. Will have the nursing staff fax the results of his cbc and cmp to them. Will continue to monitor his status.

## 2013-11-03 NOTE — Addendum Note (Signed)
Addended by: Gerlene Fee on: 11/03/2013 01:54 PM   Modules accepted: Orders, Medications

## 2013-11-11 ENCOUNTER — Encounter: Payer: Self-pay | Admitting: Internal Medicine

## 2013-11-11 ENCOUNTER — Non-Acute Institutional Stay (SKILLED_NURSING_FACILITY): Payer: Managed Care, Other (non HMO) | Admitting: Internal Medicine

## 2013-11-11 DIAGNOSIS — H409 Unspecified glaucoma: Secondary | ICD-10-CM

## 2013-11-11 DIAGNOSIS — E1165 Type 2 diabetes mellitus with hyperglycemia: Principal | ICD-10-CM

## 2013-11-11 DIAGNOSIS — Z227 Latent tuberculosis: Secondary | ICD-10-CM

## 2013-11-11 DIAGNOSIS — E7800 Pure hypercholesterolemia, unspecified: Secondary | ICD-10-CM

## 2013-11-11 DIAGNOSIS — G309 Alzheimer's disease, unspecified: Secondary | ICD-10-CM

## 2013-11-11 DIAGNOSIS — M159 Polyosteoarthritis, unspecified: Secondary | ICD-10-CM

## 2013-11-11 DIAGNOSIS — R7611 Nonspecific reaction to tuberculin skin test without active tuberculosis: Secondary | ICD-10-CM

## 2013-11-11 DIAGNOSIS — F329 Major depressive disorder, single episode, unspecified: Secondary | ICD-10-CM

## 2013-11-11 DIAGNOSIS — M150 Primary generalized (osteo)arthritis: Secondary | ICD-10-CM

## 2013-11-11 DIAGNOSIS — F3289 Other specified depressive episodes: Secondary | ICD-10-CM

## 2013-11-11 DIAGNOSIS — F0393 Unspecified dementia, unspecified severity, with mood disturbance: Secondary | ICD-10-CM

## 2013-11-11 DIAGNOSIS — IMO0001 Reserved for inherently not codable concepts without codable children: Secondary | ICD-10-CM

## 2013-11-11 DIAGNOSIS — I82409 Acute embolism and thrombosis of unspecified deep veins of unspecified lower extremity: Secondary | ICD-10-CM

## 2013-11-11 DIAGNOSIS — F0280 Dementia in other diseases classified elsewhere without behavioral disturbance: Secondary | ICD-10-CM

## 2013-11-11 NOTE — Progress Notes (Signed)
Patient ID: Derrick Rubio, male   DOB: 11-25-1935, 78 y.o.   MRN: [id number]    Facility: [facility]    PCP: Blanchie Serve, MD  Code Status: DNR  No Known Allergies  Chief Complaint: new admission  HPI:  78 y/o male patient is here for long term care from another facility. He has history of dementia, dm type 2, depression, OA, glaucoma and latent Tuberculosis with completed treatment, hyperlipidemia. No concerns from patient at present.  No falls reported. No new skin concerns  Review of Systems:  Constitutional: Negative for fever, chills, weight loss, malaise/fatigue and diaphoresis.  HENT: Negative for congestion, hearing loss and sore throat.   Eyes: Negative for eye pain, blurred vision, double vision and discharge.  Respiratory: Negative for cough, sputum production, shortness of breath and wheezing.   Cardiovascular: Negative for chest pain, palpitations, orthopnea and leg swelling.  Gastrointestinal: Negative for heartburn, nausea, vomiting, abdominal pain, diarrhea and constipation.  Genitourinary: Negative for dysuria, urgency, frequency, hematuria and flank pain.  Musculoskeletal: Negative for back pain, falls, joint pain and myalgias.  Skin: Negative for itching and rash.  Neurological: Negative for dizziness, tingling, focal weakness and headaches.  Psychiatric/Behavioral: The patient is not nervous/anxious.     Past Medical History  Diagnosis Date  . Depression   . Dementia   . Diabetes mellitus without complication   . Cancer   . Latent tuberculosis 09/16/2013  . DVT (deep venous thrombosis) 09/16/2013  . 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 Osteoarthritis 09/16/2013  . Pure hypercholesterolemia 10/18/2013   History reviewed. No pertinent past surgical history. Social History:   reports that he has never smoked. He has never used smokeless tobacco. He reports that he does not drink alcohol or use illicit drugs.  No family  history on file.  Medications: Patient's Medications  New Prescriptions   No medications on file  Previous Medications   ACETAMINOPHEN (TYLENOL) 500 MG TABLET    Take 500 mg by mouth every 4 (four) hours as needed for mild pain or fever.    ASPIRIN EC 81 MG TABLET    Take 81 mg by mouth daily.   ATORVASTATIN (LIPITOR) 10 MG TABLET    Take 10 mg by mouth daily.   CITALOPRAM (CELEXA) 20 MG TABLET    Take 20 mg by mouth daily.   DONEPEZIL (ARICEPT) 10 MG TABLET    Take 10 mg by mouth at bedtime.    HYDROCODONE-ACETAMINOPHEN (NORCO/VICODIN) 5-325 MG PER TABLET    Take one tablet by mouth every 4 hours as needed for pain   LATANOPROST (XALATAN) 0.005 % OPHTHALMIC SOLUTION    Place 1 drop into both eyes at bedtime.   MELOXICAM (MOBIC) 15 MG TABLET    Take 15 mg by mouth daily.   MEMANTINE HCL ER (NAMENDA XR) 14 MG CP24    Take 14 mg by mouth daily.   METFORMIN (GLUCOPHAGE) 500 MG TABLET    Take 500 mg by mouth 2 (two) times daily with a meal.   Modified Medications   No medications on file  Discontinued Medications   No medications on file     Physical Exam: Filed Vitals:   11/11/13 1658  BP: 135/76  Pulse: 87  Temp: 97 F (36.1 C)  Resp: 18  SpO2: 96%    General- elderly male in no acute distress, thin, frail Head- atraumatic, normocephalic Eyes- PERRLA, EOMI, no pallor, no icterus, no discharge Neck- no lymphadenopathy, no thyromegaly Throat-  moist mucus membrane Cardiovascular- normal s1,s2, no murmurs, intact distal pulses Respiratory- bilateral clear to auscultation, no wheeze, no rhonchi, no crackles, no use of accessory muscles Abdomen- bowel sounds present, soft, non tender Musculoskeletal- able to move all 4 extremities, no leg edema Neurological- no focal deficit Skin- warm and dry Psychiatry- normal mood and affect  Labs reviewed: Basic Metabolic Panel:  Recent Labs  04/01/13 1651 06/01/13 1504 08/21/13 1325  NA 138 134* 134*  K 4.5 4.2 4.4  CL 101 100  96  CO2 29 27 24   GLUCOSE 93 111* 156*  BUN 15 11 9   CREATININE 1.12 1.04 0.96  CALCIUM 9.4 9.0 9.6   Liver Function Tests:  Recent Labs  04/01/13 1651 08/21/13 1351  AST 27 31  ALT 27 18  ALKPHOS 83 67  BILITOT 0.3 0.5  PROT 7.7 7.9  ALBUMIN 4.5 3.6   No results found for this basename: LIPASE, AMYLASE,  in the last 8760 hours No results found for this basename: AMMONIA,  in the last 8760 hours CBC:  Recent Labs  06/01/13 1504 08/21/13 1325  WBC 4.2 9.1  NEUTROABS 2.6  --   HGB 13.8 14.3  HCT 39.8 40.3  MCV 92.3 91.4  PLT 183 243   Cardiac Enzymes:  Recent Labs  06/01/13 1504  TROPONINI <0.30   BNP: No components found with this basename: POCBNP,  CBG:  Recent Labs  08/13/13 1022  GLUCAP 90   08-21-13: wbc 9.1; hgb 14.3; hct 40.3; mcv 91.4 plt 243; glucose 156; bun 9; creat 0.96; k+4.4; na++134; liver normal albumin 3.6   Imaging 08-21-13: bilateral lower extremity doppler: No evidence of deep vein or superficial thrombosis involving the right lower extremity and left lower extremity. - No evidence of Baker's cyst on the right or left.  08-21-13: right knee x-ray: No fracture or dislocation. Advanced arthropathic changes. Although this may be osteoarthritis, CPPD arthropathy should be considered given the pattern. There is a large joint effusion.   Assessment/Plan  DVT s/p IVC filter placement. Continue baby aspirin  alzhimer's Dementia continue namenda xr 14 mg daily. Continue aricept. Skin care and assistance with ADLS  Dm Monitor cbg. Continue metformin 500 mg bid. Continue atorvastatin and asa.   Depression continue celexa 20 mg daily, monitor for behavior changes  OA persists. Will give him mobic, prn vicodin and all precautions  Latent tuberculosis Completed treatment. Monitor clinically  Hyperlipidemia continue lipitor 10 mg daily   Glaucoma continue xalatan to both eyes nightly    Family/ staff Communication: reviewed care plan  with patient and nursing supervisor  Goals of care: long term care   Labs/tests ordered: cbc, cmp    Blanchie Serve, MD  [facility] [phone] (Monday-Friday 8 am - 5 pm) [phone] (afterhours)

## 2013-11-18 ENCOUNTER — Non-Acute Institutional Stay (SKILLED_NURSING_FACILITY): Payer: Managed Care, Other (non HMO) | Admitting: Adult Health

## 2013-11-18 DIAGNOSIS — K625 Hemorrhage of anus and rectum: Secondary | ICD-10-CM

## 2013-12-02 ENCOUNTER — Other Ambulatory Visit: Payer: Self-pay | Admitting: Gastroenterology

## 2013-12-03 ENCOUNTER — Non-Acute Institutional Stay (SKILLED_NURSING_FACILITY): Payer: Managed Care, Other (non HMO) | Admitting: Adult Health

## 2013-12-03 DIAGNOSIS — G309 Alzheimer's disease, unspecified: Secondary | ICD-10-CM

## 2013-12-03 DIAGNOSIS — E1165 Type 2 diabetes mellitus with hyperglycemia: Secondary | ICD-10-CM

## 2013-12-03 DIAGNOSIS — M150 Primary generalized (osteo)arthritis: Principal | ICD-10-CM

## 2013-12-03 DIAGNOSIS — F329 Major depressive disorder, single episode, unspecified: Secondary | ICD-10-CM

## 2013-12-03 DIAGNOSIS — F0393 Unspecified dementia, unspecified severity, with mood disturbance: Secondary | ICD-10-CM

## 2013-12-03 DIAGNOSIS — E7800 Pure hypercholesterolemia, unspecified: Secondary | ICD-10-CM

## 2013-12-03 DIAGNOSIS — K625 Hemorrhage of anus and rectum: Secondary | ICD-10-CM

## 2013-12-03 DIAGNOSIS — H409 Unspecified glaucoma: Secondary | ICD-10-CM

## 2013-12-03 DIAGNOSIS — IMO0001 Reserved for inherently not codable concepts without codable children: Secondary | ICD-10-CM

## 2013-12-03 DIAGNOSIS — F0280 Dementia in other diseases classified elsewhere without behavioral disturbance: Secondary | ICD-10-CM

## 2013-12-03 DIAGNOSIS — F3289 Other specified depressive episodes: Secondary | ICD-10-CM

## 2013-12-03 DIAGNOSIS — M159 Polyosteoarthritis, unspecified: Secondary | ICD-10-CM

## 2013-12-06 LAB — CBC AND DIFFERENTIAL
HEMATOCRIT: 39 % — AB (ref 41–53)
HEMOGLOBIN: 12.8 g/dL — AB (ref 13.5–17.5)
WBC: 5.8 10*3/mL

## 2013-12-06 LAB — LIPID PANEL
CHOLESTEROL: 152 mg/dL (ref 0–200)
LDL Cholesterol: 93 mg/dL
TRIGLYCERIDES: 53 mg/dL (ref 40–160)

## 2013-12-06 LAB — HEMOGLOBIN A1C: Hgb A1c MFr Bld: 6.1 % — AB (ref 4.0–6.0)

## 2013-12-07 ENCOUNTER — Encounter (HOSPITAL_COMMUNITY): Payer: Self-pay | Admitting: Pharmacy Technician

## 2013-12-07 LAB — BASIC METABOLIC PANEL: Sodium: 139 mmol/L (ref 137–147)

## 2013-12-08 ENCOUNTER — Encounter (HOSPITAL_COMMUNITY): Payer: Self-pay | Admitting: *Deleted

## 2013-12-08 DIAGNOSIS — K625 Hemorrhage of anus and rectum: Secondary | ICD-10-CM | POA: Insufficient documentation

## 2013-12-08 NOTE — Progress Notes (Signed)
Patient ID: Derrick Rubio, male   DOB: 1935/07/30, 78 y.o.   MRN: [id number]     [facility]  No Known Allergies   Chief Complaint  Patient presents with  . Acute Visit    rectal bleeding     HPI:  Last night he had a rectal bleed; it was felt there could be hemorrhoids present. There are no reports of diarrhea or constipation present. He was seen by GI who has recommended anusol supp twice daily for 10 days. He asa and mobic have been stopped.  Based upon the results of the kub last night he was given a dulcolax supp.    Past Medical History  Diagnosis Date  . Depression   . Dementia   . Diabetes mellitus without complication   . Cancer   . Latent tuberculosis 09/16/2013  . DVT (deep venous thrombosis) 09/16/2013  . 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 Pure hypercholesterolemia 10/18/2013  . Glaucoma   . Latent tuberculosis   . Hypercholesteremia   . Osteoarthritis 09/16/2013    Past Surgical History  Procedure Laterality Date  . Ivc filter placement      VITAL SIGNS BP 122/82  Pulse 76  Ht 6' (1.829 m)  Wt 165 lb 12.8 oz (75.206 kg)  BMI 22.48 kg/m2   Patient's Medications  New Prescriptions   No medications on file  Previous Medications   ACETAMINOPHEN (TYLENOL) 500 MG TABLET    Take 500 mg by mouth every 4 (four) hours as needed for mild pain or fever.    ATORVASTATIN (LIPITOR) 10 MG TABLET    Take 10 mg by mouth daily.   CITALOPRAM (CELEXA) 20 MG TABLET    Take 20 mg by mouth daily.   DONEPEZIL (ARICEPT) 10 MG TABLET    Take 10 mg by mouth at bedtime.    HYDROCODONE-ACETAMINOPHEN (NORCO/VICODIN) 5-325 MG PER TABLET    Take one tablet by mouth every 4 hours as needed for pain   LATANOPROST (XALATAN) 0.005 % OPHTHALMIC SOLUTION    Place 1 drop into both eyes at bedtime.   MEMANTINE HCL ER (NAMENDA XR) 14 MG CP24    Take 14 mg by mouth daily.   METFORMIN (GLUCOPHAGE) 500 MG TABLET    Take 500 mg by mouth 2 (two) times daily with a meal.   Modified  Medications   No medications on file  Discontinued Medications   No medications on file    SIGNIFICANT DIAGNOSTIC EXAMS  08-21-13: bilateral lower extremity doppler: No evidence of deep vein or superficial thrombosis involving the right lower extremity and left lower extremity. - No evidence of Baker's cyst on the right or left.  08-21-13: right knee x-ray: No fracture or dislocation. Advanced arthropathic changes. Although this may be osteoarthritis, CPPD arthropathy should be considered given the pattern. There is a large joint effusion.  11-17-13: kub: 1. Moderate to prominent quantities of fecal material are present in the colon especially the splenic flexure and descending colon area. 2. No signs of mechanical obstruction or ileus can be identified.     LABS REVIEWED:   08-21-13: wbc 9.1; hgb 14.3; hct 40.3; mcv 91.4 plt 243; glucose 156; bun 9; creat 0.96; k+4.4; na++134; liver normal albumin 3.6  11-01-13: wbc 5.8; hgb 12.8; hct 36.7; mcv 95.4; plt 241; glucose 97; bun 10; creat 0.8; k+4.0; na++136; liver normal albumin 3.5     Review of Systems  Constitutional: Negative for malaise/fatigue.  Respiratory: Negative for cough and shortness of  breath.   Cardiovascular: Negative for chest pain.  Gastrointestinal: Negative for heartburn and abdominal pain.  Musculoskeletal: Negative for joint pain and myalgias.  Skin: Negative.   Psychiatric/Behavioral: The patient is not nervous/anxious and does not have insomnia.       Physical Exam  Constitutional: No distress.  thin  Neck: Neck supple. No JVD present. No thyromegaly present.  Cardiovascular: Normal rate, regular rhythm and intact distal pulses.   Respiratory: Effort normal and breath sounds normal. No respiratory distress. He has no wheezes.  GI: Soft. Bowel sounds are normal. He exhibits no distension. There is no tenderness. stool + guaiac; no hemorrhoids palpated.  Musculoskeletal: Normal range of motion. He exhibits no  edema.  Neurological: He is alert.  Skin: Skin is warm and dry. He is not diaphoretic.  Psychiatric: He has a normal mood and affect.        ASSESSMENT/ PLAN:  1. Rectal bleed: will stop asa; mobic; all nsiads; will guaiac stool X3; has seen GI today; will begin anusol supp twice daily for 10 days.

## 2013-12-12 NOTE — Progress Notes (Signed)
Patient ID: Derrick Rubio, male   DOB: 1936/02/24, 78 y.o.   MRN: [id number]     [facility]  No Known Allergies   Chief Complaint  Patient presents with  . Medical Management of Chronic Issues    HPI:  He is being seen for the management of his chronic illnesses. Overall his status remains stable. He has not any further rectal bleeding. He is being followed by GI and will have a colonoscopy on 12-16-13. He is unable to fully participate in the hpi or ros. There are no concerns being voiced by the nursing staff at this time.   Past Medical History  Diagnosis Date  . Depression   . Dementia   . Diabetes mellitus without complication   . Cancer   . Latent tuberculosis 09/16/2013  . DVT (deep venous thrombosis) 09/16/2013  . 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 Pure hypercholesterolemia 10/18/2013  . Glaucoma   . Latent tuberculosis   . Hypercholesteremia   . Osteoarthritis 09/16/2013    Past Surgical History  Procedure Laterality Date  . Ivc filter placement      VITAL SIGNS BP 130/78  Pulse 74  Ht 6' (1.829 m)  Wt 166 lb 6.4 oz (75.479 kg)  BMI 22.56 kg/m2   Patient's Medications  New Prescriptions   No medications on file  Previous Medications   ACETAMINOPHEN (TYLENOL) 500 MG TABLET    Take 500 mg by mouth every 4 (four) hours as needed for mild pain or fever.    ATORVASTATIN (LIPITOR) 10 MG TABLET    Take 10 mg by mouth daily.   CITALOPRAM (CELEXA) 20 MG TABLET    Take 20 mg by mouth daily.   DONEPEZIL (ARICEPT) 10 MG TABLET    Take 10 mg by mouth at bedtime.    HYDROCODONE-ACETAMINOPHEN (NORCO/VICODIN) 5-325 MG PER TABLET    Take one tablet by mouth every 4 hours as needed for pain   LATANOPROST (XALATAN) 0.005 % OPHTHALMIC SOLUTION    Place 1 drop into both eyes at bedtime.   MEMANTINE HCL ER (NAMENDA XR) 14 MG CP24    Take 14 mg by mouth daily.   METFORMIN (GLUCOPHAGE) 500 MG TABLET    Take 500 mg by mouth 2 (two) times daily with a meal.   Modified  Medications   No medications on file  Discontinued Medications   No medications on file    SIGNIFICANT DIAGNOSTIC EXAMS  08-21-13: bilateral lower extremity doppler: No evidence of deep vein or superficial thrombosis involving the right lower extremity and left lower extremity. - No evidence of Baker's cyst on the right or left.  08-21-13: right knee x-ray: No fracture or dislocation. Advanced arthropathic changes. Although this may be osteoarthritis, CPPD arthropathy should be considered given the pattern. There is a large joint effusion.  11-17-13: kub: 1. Moderate to prominent quantities of fecal material are present in the colon especially the splenic flexure and descending colon area. 2. No signs of mechanical obstruction or ileus can be identified.     LABS REVIEWED:   08-21-13: wbc 9.1; hgb 14.3; hct 40.3; mcv 91.4 plt 243; glucose 156; bun 9; creat 0.96; k+4.4; na++134; liver normal albumin 3.6  11-01-13: wbc 5.8; hgb 12.8; hct 36.7; mcv 95.4; plt 241; glucose 97; bun 10; creat 0.8; k+4.0; na++136; liver normal albumin 3.5     Review of Systems  Constitutional: Negative for malaise/fatigue.  Respiratory: Negative for cough and shortness of breath.   Cardiovascular: Negative for  chest pain.  Gastrointestinal: Negative for heartburn and abdominal pain.  Musculoskeletal: Negative for joint pain and myalgias.  Skin: Negative.   Psychiatric/Behavioral: The patient is not nervous/anxious and does not have insomnia.       Physical Exam  Constitutional: No distress.  thin  Neck: Neck supple. No JVD present. No thyromegaly present.  Cardiovascular: Normal rate, regular rhythm and intact distal pulses.   Respiratory: Effort normal and breath sounds normal. No respiratory distress. He has no wheezes.  GI: Soft. Bowel sounds are normal. He exhibits no distension. There is no tenderness. Musculoskeletal: Normal range of motion. He exhibits no edema.  Neurological: He is alert.  Skin:  Skin is warm and dry. He is not diaphoretic.  Psychiatric: He has a normal mood and affect.         ASSESSMENT/ PLAN:  1. Rectal bleeding: he is being followed by GI; he is due for a colonoscopy on 12-16-13; he has not had any further bleeding present; will monitor his status  His asa was stopped.   2. Alzheimer's disease: no change in status: will continue namenda xr 14 mg daily and aricept 10 mg nightly will monitor his status.   3. Diabetes: is stable will continue metformin 500 mg twice daily and will monitor his status   4. Dyslipidemia: will continue lipitor 10 mg daily   5. Depression: his mood state is stable will continue celexa 20 mg daily will monitor   6. Osteoarthritis: is stable will continue  vicodin 5/325 mg every 4 hours as needed will monitor  His mobic was stopped due to his rectal bleeding.   7. Glaucoma: will continue xalatan to both eyes nightly

## 2013-12-16 ENCOUNTER — Encounter (HOSPITAL_COMMUNITY): Payer: Self-pay | Admitting: Anesthesiology

## 2013-12-16 ENCOUNTER — Ambulatory Visit (HOSPITAL_COMMUNITY)
Admission: RE | Admit: 2013-12-16 | Payer: Managed Care, Other (non HMO) | Source: Ambulatory Visit | Admitting: Gastroenterology

## 2013-12-16 HISTORY — DX: Unspecified glaucoma: H40.9

## 2013-12-16 SURGERY — COLONOSCOPY WITH PROPOFOL
Anesthesia: Monitor Anesthesia Care

## 2013-12-16 NOTE — Anesthesia Preprocedure Evaluation (Deleted)
Anesthesia Evaluation  Patient identified by MRN, date of birth, ID band Patient awake    Reviewed: Allergy & Precautions, H&P , NPO status , Patient's Chart, lab work & pertinent test results  Airway Mallampati: II TM Distance: >3 FB Neck ROM: Full    Dental  (+) Dental Advisory Given   Pulmonary neg pulmonary ROS,  breath sounds clear to auscultation        Cardiovascular negative cardio ROS  Rhythm:Regular Rate:Normal     Neuro/Psych PSYCHIATRIC DISORDERS Depression negative neurological ROS     GI/Hepatic negative GI ROS, Neg liver ROS,   Endo/Other  diabetes, Type 2, Oral Hypoglycemic Agents  Renal/GU negative Renal ROS     Musculoskeletal negative musculoskeletal ROS (+)   Abdominal   Peds  Hematology negative hematology ROS (+)   Anesthesia Other Findings   Reproductive/Obstetrics                           Anesthesia Physical Anesthesia Plan  ASA: III  Anesthesia Plan: MAC   Post-op Pain Management:    Induction: Intravenous  Airway Management Planned:   Additional Equipment:   Intra-op Plan:   Post-operative Plan:   Informed Consent: I have reviewed the patients History and Physical, chart, labs and discussed the procedure including the risks, benefits and alternatives for the proposed anesthesia with the patient or authorized representative who has indicated his/her understanding and acceptance.   Dental advisory given  Plan Discussed with: CRNA  Anesthesia Plan Comments:         Anesthesia Quick Evaluation

## 2013-12-21 ENCOUNTER — Other Ambulatory Visit: Payer: Self-pay | Admitting: Gastroenterology

## 2014-01-07 ENCOUNTER — Non-Acute Institutional Stay (SKILLED_NURSING_FACILITY): Payer: Managed Care, Other (non HMO) | Admitting: Adult Health

## 2014-01-07 ENCOUNTER — Encounter (HOSPITAL_COMMUNITY): Payer: Self-pay | Admitting: Pharmacy Technician

## 2014-01-07 DIAGNOSIS — G309 Alzheimer's disease, unspecified: Secondary | ICD-10-CM

## 2014-01-07 DIAGNOSIS — IMO0001 Reserved for inherently not codable concepts without codable children: Secondary | ICD-10-CM

## 2014-01-07 DIAGNOSIS — E7800 Pure hypercholesterolemia, unspecified: Secondary | ICD-10-CM

## 2014-01-07 DIAGNOSIS — K625 Hemorrhage of anus and rectum: Secondary | ICD-10-CM

## 2014-01-07 DIAGNOSIS — F0280 Dementia in other diseases classified elsewhere without behavioral disturbance: Secondary | ICD-10-CM

## 2014-01-07 DIAGNOSIS — E1165 Type 2 diabetes mellitus with hyperglycemia: Secondary | ICD-10-CM

## 2014-01-07 NOTE — Assessment & Plan Note (Signed)
His function and behavior are stable at this time. I am not sure why he is not on full dose Namenda, he may not have tolerated it. Continue at current dose of 14mg . He is on Aricept at 10mg . Given that he has a h/o rectal bleeding currently being evaluated we will hold this med.

## 2014-01-07 NOTE — Assessment & Plan Note (Addendum)
Last A1C at goal of 6.1.  Continue metformin as tolerated. Check CBG qam.

## 2014-01-07 NOTE — Assessment & Plan Note (Signed)
Stable, no overt bleeding at this time. Check CBC next draw and await colonoscopy.

## 2014-01-07 NOTE — Progress Notes (Signed)
Patient ID: Derrick Rubio, male   DOB: [birth year], 78 y.o.   MRN: [id number]   [facility] (31)  Code Status:  DNR  Chief Complaint  Patient presents with  . Medical Management of Chronic Issues    HPI: This is a 78 y.o. AA male living at [facility] due to dementia. I am here to review his chronic illnesses. He has no complaints at this time. He propels himself throughout the facility in a WC. His weight has remained stable at 168.4 lbs. He has a colonoscopy scheduled due to rectal bleeding on 01/24/14. He is in no acute distress.  Last Hgb was 12.8 in May of 2015.  No Known Allergies  MEDICATIONS -  Reviewed Outpatient Encounter Prescriptions as of 01/07/2014  Medication Sig  . acetaminophen (TYLENOL) 500 MG tablet Take 500 mg by mouth every 4 (four) hours as needed for mild pain or fever.   Marland Kitchen atorvastatin (LIPITOR) 10 MG tablet Take 10 mg by mouth daily.  . citalopram (CELEXA) 20 MG tablet Take 20 mg by mouth every morning.   . donepezil (ARICEPT) 10 MG tablet Take 10 mg by mouth at bedtime.   Marland Kitchen HYDROcodone-acetaminophen (NORCO/VICODIN) 5-325 MG per tablet Take one tablet by mouth every 4 hours as needed for pain  . latanoprost (XALATAN) 0.005 % ophthalmic solution Place 1 drop into both eyes at bedtime.  . Memantine HCl ER (NAMENDA XR) 14 MG CP24 Take 14 mg by mouth daily.  . metFORMIN (GLUCOPHAGE) 500 MG tablet Take 500 mg by mouth 2 (two) times daily with a meal.      DATA REVIEWED   SIGNIFICANT DIAGNOSTIC EXAMS  08-21-13: bilateral lower extremity doppler: No evidence of deep vein or superficial thrombosis involving the right lower extremity and left lower extremity. - No evidence of Baker's cyst on the right or left.  08-21-13: right knee x-ray: No fracture or dislocation. Advanced arthropathic changes. Although this may be osteoarthritis, CPPD arthropathy should be considered given the pattern. There is a large joint effusion.  11-17-13: kub: 1.  Moderate to prominent quantities of fecal material are present in the colon especially the splenic flexure and descending colon area. 2. No signs of mechanical obstruction or ileus can be identified.     LABS REVIEWED:   08-21-13: wbc 9.1; hgb 14.3; hct 40.3; mcv 91.4 plt 243; glucose 156; bun 9; creat 0.96; k+4.4; na++134; liver normal albumin 3.6  11-01-13: wbc 5.8; hgb 12.8; hct 36.7; mcv 95.4; plt 241; glucose 97; bun 10; creat 0.8; k+4.0; na++136; liver normal albumin 3.5 12-06-13: wbc 5.8; hgb 12.8; MCV 95.9 plt 235; hgb a1c 6.1; chol 152; ldl 93; trig 53    REVIEW OF SYSTEMS  DATA OBTAINED: from patient, nurse, medical record, poor historian GENERAL: Feels well  No recent fever, fatigue, change in activity status, appetite, or weight  RESPIRATORY: No cough, wheezing, SOB CARDIAC: No chest pain, palpitations. No edema GI: No abdominal pain  No Nausea,vomiting,diarrhea or constipation  No heartburn or reflux  MUSCULOSKELETAL: No joint pain, swelling or stiffness   No back pain    No muscle ache, pain, weakness    NEUROLOGIC: No dizziness, fainting, headache, numbness No change in mental status  PSYCHIATRIC: No feelings of anxiety, depression  Sleeps well   No behavior issue  PHYSICAL EXAM Filed Vitals:   01/07/14 1418  BP: 117/58  Pulse: 86  Temp: 97.8 F (36.6 C)  Resp: 20  Weight: 168 lb 6.4 oz (76.386  kg)   Body mass index is 22.83 kg/(m^2). GENERAL APPEARANCE: No acute distress, appropriately groomed, normal body habitus  SKIN: No diaphoresis, rash, wound HEAD: Normocephalic, atraumatic EYES: Conjunctiva/lids clear  RESPIRATORY: Breathing is even, unlabored  Lung sounds are clear and full  CARDIOVASCULAR: Heart RRR   No murmur or extra heart sounds   EDEMA: No peripheral edema   ABD: non tender non distended,  BS x4 MUSCULOSKELETAL.  Decreased Strength 3/5 to all ext. Propels in the [place]: Mood and affect appropriate to situation. Alert to self and situation,  able to f/c  ASSESSMENT/PLAN  Rectal bleeding Stable, no overt bleeding at this time. Check CBC next draw and await colonoscopy.  Type II or unspecified type diabetes mellitus without mention of complication, uncontrolled Last A1C at goal of 6.1.  Continue metformin as tolerated. Check CBG qam.   Pure hypercholesterolemia No recent lipid profile for review as he was from another facility. Will check lipid profile and CMP next draw  Alzheimer's dementia without behavioral disturbance His function and behavior are stable at this time. I am not sure why he is not on full dose Namenda, he may not have tolerated it. Continue at current dose of 14mg . He is on Aricept at 10mg . Given that he has a h/o rectal bleeding currently being evaluated we will hold this med.    Debbie Green,NP/ Royal Hawthorn RN, MSN  01/07/2014

## 2014-01-07 NOTE — Assessment & Plan Note (Signed)
No recent lipid profile for review as he was from another facility. Will check lipid profile and CMP next draw

## 2014-01-20 ENCOUNTER — Encounter (HOSPITAL_COMMUNITY): Payer: Self-pay | Admitting: *Deleted

## 2014-01-21 ENCOUNTER — Encounter (HOSPITAL_COMMUNITY): Payer: Self-pay | Admitting: *Deleted

## 2014-01-24 ENCOUNTER — Encounter (HOSPITAL_COMMUNITY)
Admission: RE | Disposition: A | Discharge status: Skilled Nursing Facility | Payer: Self-pay | Source: Ambulatory Visit | Attending: Gastroenterology

## 2014-01-24 ENCOUNTER — Encounter (HOSPITAL_COMMUNITY): Payer: Self-pay | Admitting: *Deleted

## 2014-01-24 ENCOUNTER — Ambulatory Visit (HOSPITAL_COMMUNITY)
Admission: RE | Admit: 2014-01-24 | Discharge: 2014-01-24 | Disposition: A | Discharge status: Skilled Nursing Facility | Payer: Managed Care, Other (non HMO) | Source: Ambulatory Visit | Attending: Gastroenterology | Admitting: Gastroenterology

## 2014-01-24 ENCOUNTER — Encounter (HOSPITAL_COMMUNITY): Payer: Managed Care, Other (non HMO) | Admitting: Certified Registered"

## 2014-01-24 ENCOUNTER — Ambulatory Visit (HOSPITAL_COMMUNITY): Payer: Managed Care, Other (non HMO) | Admitting: Certified Registered"

## 2014-01-24 DIAGNOSIS — E7800 Pure hypercholesterolemia, unspecified: Secondary | ICD-10-CM | POA: Insufficient documentation

## 2014-01-24 DIAGNOSIS — Z8546 Personal history of malignant neoplasm of prostate: Secondary | ICD-10-CM | POA: Diagnosis not present

## 2014-01-24 DIAGNOSIS — F0390 Unspecified dementia without behavioral disturbance: Secondary | ICD-10-CM | POA: Diagnosis not present

## 2014-01-24 DIAGNOSIS — E119 Type 2 diabetes mellitus without complications: Secondary | ICD-10-CM | POA: Diagnosis not present

## 2014-01-24 DIAGNOSIS — K921 Melena: Secondary | ICD-10-CM | POA: Insufficient documentation

## 2014-01-24 DIAGNOSIS — F3289 Other specified depressive episodes: Secondary | ICD-10-CM | POA: Diagnosis not present

## 2014-01-24 DIAGNOSIS — F329 Major depressive disorder, single episode, unspecified: Secondary | ICD-10-CM | POA: Diagnosis not present

## 2014-01-24 DIAGNOSIS — Z79899 Other long term (current) drug therapy: Secondary | ICD-10-CM | POA: Insufficient documentation

## 2014-01-24 HISTORY — PX: COLONOSCOPY WITH PROPOFOL: SHX5780

## 2014-01-24 LAB — GLUCOSE, CAPILLARY: Glucose-Capillary: 79 mg/dL (ref 70–99)

## 2014-01-24 SURGERY — COLONOSCOPY WITH PROPOFOL
Anesthesia: Monitor Anesthesia Care

## 2014-01-24 MED ORDER — PROPOFOL 10 MG/ML IV BOLUS
INTRAVENOUS | Status: DC | PRN
  Administered 2014-01-24 (×3): 20 mg via INTRAVENOUS

## 2014-01-24 MED ORDER — PROPOFOL 10 MG/ML IV BOLUS
INTRAVENOUS | Status: AC
  Filled 2014-01-24: qty 20

## 2014-01-24 MED ORDER — LIDOCAINE HCL (CARDIAC) 20 MG/ML IV SOLN
INTRAVENOUS | Status: DC | PRN
Start: 2014-01-24 — End: 2014-01-24
  Administered 2014-01-24: 75 mg via INTRAVENOUS

## 2014-01-24 MED ORDER — LIDOCAINE HCL (CARDIAC) 20 MG/ML IV SOLN
INTRAVENOUS | Status: AC
  Filled 2014-01-24: qty 5

## 2014-01-24 MED ORDER — PROPOFOL INFUSION 10 MG/ML OPTIME
INTRAVENOUS | Status: DC | PRN
  Administered 2014-01-24: 140 ug/kg/min via INTRAVENOUS

## 2014-01-24 MED ORDER — LACTATED RINGERS IV SOLN
INTRAVENOUS | Status: DC | PRN
  Administered 2014-01-24: 11:00:00 via INTRAVENOUS

## 2014-01-24 MED ORDER — PROMETHAZINE HCL 25 MG/ML IJ SOLN: 6.2500 mg | INTRAMUSCULAR | Status: DC | PRN

## 2014-01-24 MED ORDER — SODIUM CHLORIDE 0.9 % IV SOLN: INTRAVENOUS | Status: DC

## 2014-01-24 SURGICAL SUPPLY — 22 items

## 2014-01-24 NOTE — Anesthesia Postprocedure Evaluation (Signed)
  Anesthesia Post-op Note  Patient: Derrick Rubio  Procedure(s) Performed: Procedure(s) (LRB): COLONOSCOPY WITH PROPOFOL (N/A)  Patient Location: PACU  Anesthesia Type: MAC  Level of Consciousness: awake and alert   Airway and Oxygen Therapy: Patient Spontanous Breathing  Post-op Pain: mild  Post-op Assessment: Post-op Vital signs reviewed, Patient's Cardiovascular Status Stable, Respiratory Function Stable, Patent Airway and No signs of Nausea or vomiting  Last Vitals:  Filed Vitals:   01/24/14 1314  BP: 109/80  Pulse: 66  Temp:   Resp: 13    Post-op Vital Signs: stable   Complications: No apparent anesthesia complications

## 2014-01-24 NOTE — Discharge Instructions (Addendum)

## 2014-01-24 NOTE — H&P (Signed)
  Problem: Intermittent hematochezia with normal hemoglobin. Normal screening colonoscopy performed on 06/03/2003  History: The patient is a 78 year old male with dementia. The patient has had a few episodes of large volume hematochezia associated with a normal hemoglobin level.  I discussed with his wife who is at the bedside the complications associated with colonoscopy with colon polyp removal including intestinal bleeding, intestinal perforation requiring emergency surgery, and oversedation.  Past medical history: Type 2 diabetes mellitus. Hypercholesterolemia. Dementia. Glaucoma. Prostate cancer. Right inguinal hernia surgery. Left Baker's cyst aspirated.  Medication allergies: None  Exam: The patient is alert and lying comfortably on the endoscopy stretcher. Lungs are clear to auscultation. Cardiac exam reveals a regular rhythm. Abdomen is soft and nontender to palpation.  Plan: Proceed with diagnostic colonoscopy to evaluate hematochezia

## 2014-01-24 NOTE — Transfer of Care (Signed)
Immediate Anesthesia Transfer of Care Note  Patient: Derrick Rubio  Procedure(s) Performed: Procedure(s): COLONOSCOPY WITH PROPOFOL (N/A)  Patient Location: PACU  Anesthesia Type:MAC  Level of Consciousness: sedated  Airway & Oxygen Therapy: Patient Spontanous Breathing and Patient connected to face mask oxygen  Post-op Assessment: Report given to PACU RN and Post -op Vital signs reviewed and stable  Post vital signs: Reviewed and stable  Complications: No apparent anesthesia complications

## 2014-01-24 NOTE — Anesthesia Preprocedure Evaluation (Signed)
Anesthesia Evaluation  Patient identified by MRN, date of birth, ID band Patient awake    Reviewed: Allergy & Precautions, H&P , NPO status , Patient's Chart, lab work & pertinent test results  Airway Mallampati: II TM Distance: >3 FB Neck ROM: Full    Dental  (+) Dental Advisory Given, Edentulous Upper   Pulmonary neg pulmonary ROS,  breath sounds clear to auscultation        Cardiovascular negative cardio ROS  Rhythm:Regular Rate:Normal     Neuro/Psych PSYCHIATRIC DISORDERS Depression negative neurological ROS     GI/Hepatic negative GI ROS, Neg liver ROS,   Endo/Other  diabetes, Type 2, Oral Hypoglycemic Agents  Renal/GU negative Renal ROS     Musculoskeletal negative musculoskeletal ROS (+)   Abdominal   Peds  Hematology negative hematology ROS (+)   Anesthesia Other Findings   Reproductive/Obstetrics                           Anesthesia Physical  Anesthesia Plan  ASA: III  Anesthesia Plan: MAC   Post-op Pain Management:    Induction: Intravenous  Airway Management Planned:   Additional Equipment:   Intra-op Plan:   Post-operative Plan:   Informed Consent: I have reviewed the patients History and Physical, chart, labs and discussed the procedure including the risks, benefits and alternatives for the proposed anesthesia with the patient or authorized representative who has indicated his/her understanding and acceptance.   Dental advisory given  Plan Discussed with: CRNA  Anesthesia Plan Comments:         Anesthesia Quick Evaluation

## 2014-01-24 NOTE — Op Note (Signed)
Problem: Intermittent hematochezia. Normal screening colonoscopy performed 06/03/2003  Endoscopist: Earle Gell  Premedication: Propofol administered by anesthesia  Procedure: Diagnostic colonoscopy The patient was placed in the left lateral decubitus position. Anal inspection and digital rectal exam were normal. The Pentax pediatric colonoscope was introduced into the rectum and advanced to the cecum. A normal-appearing appendiceal orifice and ileocecal valve were identified. Colonic preparation for the exam today was good.  Rectum. Normal. Retroflexed view of the distal rectum normal  Sigmoid colon and descending colon. Normal  Splenic flexure. Normal  Transverse colon. Normal  Hepatic flexure. Normal  Ascending colon. Normal  Cecum and ileocecal valve. Normal  Assessment: Normal diagnostic colonoscopy

## 2014-01-25 ENCOUNTER — Encounter (HOSPITAL_COMMUNITY): Payer: Self-pay | Admitting: Gastroenterology

## 2014-02-14 ENCOUNTER — Non-Acute Institutional Stay (SKILLED_NURSING_FACILITY): Payer: Managed Care, Other (non HMO) | Admitting: Adult Health

## 2014-02-14 DIAGNOSIS — F3289 Other specified depressive episodes: Secondary | ICD-10-CM

## 2014-02-14 DIAGNOSIS — F0280 Dementia in other diseases classified elsewhere without behavioral disturbance: Secondary | ICD-10-CM

## 2014-02-14 DIAGNOSIS — H409 Unspecified glaucoma: Secondary | ICD-10-CM

## 2014-02-14 DIAGNOSIS — M159 Polyosteoarthritis, unspecified: Secondary | ICD-10-CM

## 2014-02-14 DIAGNOSIS — IMO0001 Reserved for inherently not codable concepts without codable children: Secondary | ICD-10-CM

## 2014-02-14 DIAGNOSIS — E7800 Pure hypercholesterolemia, unspecified: Secondary | ICD-10-CM

## 2014-02-14 DIAGNOSIS — G309 Alzheimer's disease, unspecified: Secondary | ICD-10-CM

## 2014-02-14 DIAGNOSIS — E1165 Type 2 diabetes mellitus with hyperglycemia: Secondary | ICD-10-CM

## 2014-02-14 DIAGNOSIS — F0393 Unspecified dementia, unspecified severity, with mood disturbance: Secondary | ICD-10-CM

## 2014-02-14 DIAGNOSIS — F329 Major depressive disorder, single episode, unspecified: Secondary | ICD-10-CM

## 2014-02-14 DIAGNOSIS — M150 Primary generalized (osteo)arthritis: Principal | ICD-10-CM

## 2014-02-15 LAB — CBC AND DIFFERENTIAL
HCT: 42 % (ref 41–53)
HEMOGLOBIN: 13 g/dL — AB (ref 13.5–17.5)
Platelets: 248 10*3/uL (ref 150–399)
WBC: 5.2 10^3/mL

## 2014-02-21 ENCOUNTER — Encounter: Payer: Self-pay | Admitting: Adult Health

## 2014-02-21 NOTE — Progress Notes (Signed)
Patient ID: Derrick Rubio, male   DOB: 13-Mar-1936, 78 y.o.   MRN: [id number]     [facility]  No Known Allergies   Chief Complaint  Patient presents with  . Medical Management of Chronic Issues    HPI:  He is being seen for the management of his chronic illnesses. Overall his status remains stable. He has had a colonoscopy on 01-24-14. He is unable to fully participate in the hpi or ros, but states that he is feeling good. There are no concerns being voiced by the nursing staff at this time.    Past Medical History  Diagnosis Date  . Depression   . Dementia   . Diabetes mellitus without complication   . Latent tuberculosis 09/16/2013  . DVT (deep venous thrombosis) 09/16/2013  . 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 Pure hypercholesterolemia 10/18/2013  . Glaucoma   . Latent tuberculosis   . Hypercholesteremia   . Osteoarthritis 09/16/2013  . Cancer     Past Surgical History  Procedure Laterality Date  . Ivc filter placement    . Colonoscopy with propofol N/A 01/24/2014    Procedure: COLONOSCOPY WITH PROPOFOL;  Surgeon: Garlan Fair, MD;  Location: [facility];  Service: Endoscopy;  Laterality: N/A;    VITAL SIGNS BP 138/86  Pulse 75  Ht 6' (1.829 m)  Wt 174 lb 12.8 oz (79.289 kg)  BMI 23.70 kg/m2   Patient's Medications  New Prescriptions   No medications on file  Previous Medications   ACETAMINOPHEN (TYLENOL) 500 MG TABLET    Take 500 mg by mouth every 4 (four) hours as needed for mild pain or fever.    ATORVASTATIN (LIPITOR) 10 MG TABLET    Take 10 mg by mouth daily.   CITALOPRAM (CELEXA) 20 MG TABLET    Take 20 mg by mouth every morning.    DONEPEZIL (ARICEPT) 10 MG TABLET    Take 10 mg by mouth at bedtime.    HYDROCODONE-ACETAMINOPHEN (NORCO/VICODIN) 5-325 MG PER TABLET    Take one tablet by mouth every 4 hours as needed for pain   LATANOPROST (XALATAN) 0.005 % OPHTHALMIC SOLUTION    Place 1 drop into both eyes at bedtime.   MEMANTINE HCL ER (NAMENDA  XR) 14 MG CP24    Take 28 mg by mouth daily.    METFORMIN (GLUCOPHAGE) 500 MG TABLET    Take 500 mg by mouth 2 (two) times daily with a meal.   Modified Medications   No medications on file  Discontinued Medications   No medications on file    SIGNIFICANT DIAGNOSTIC EXAMS   08-21-13: bilateral lower extremity doppler: No evidence of deep vein or superficial thrombosis involving the right lower extremity and left lower extremity. - No evidence of Baker's cyst on the right or left.  08-21-13: right knee x-ray: No fracture or dislocation. Advanced arthropathic changes. Although this may be osteoarthritis, CPPD arthropathy should be considered given the pattern. There is a large joint effusion.  11-17-13: kub: 1. Moderate to prominent quantities of fecal material are present in the colon especially the splenic flexure and descending colon area. 2. No signs of mechanical obstruction or ileus can be identified.     LABS REVIEWED:   08-21-13: wbc 9.1; hgb 14.3; hct 40.3; mcv 91.4 plt 243; glucose 156; bun 9; creat 0.96; k+4.4; na++134; liver normal albumin 3.6  11-01-13: wbc 5.8; hgb 12.8; hct 36.7; mcv 95.4; plt 241; glucose 97; bun 10; creat 0.8; k+4.0; na++136; liver  normal albumin 3.5  12-06-13: wbc 5.8; hgb 12.8; hct 39.4; mcv 95.9; plt 235 ; chol 152; ldl 93; trig 53; hgb a1c 6.1     Review of Systems  Constitutional: Negative for malaise/fatigue.  Respiratory: Negative for cough and shortness of breath.   Cardiovascular: Negative for chest pain.  Gastrointestinal: Negative for heartburn and abdominal pain.  Musculoskeletal: Negative for joint pain and myalgias.  Skin: Negative.   Psychiatric/Behavioral: The patient is not nervous/anxious and does not have insomnia.       Physical Exam  Constitutional: No distress.  thin  Neck: Neck supple. No JVD present. No thyromegaly present.  Cardiovascular: Normal rate, regular rhythm and intact distal pulses.   Respiratory: Effort normal and  breath sounds normal. No respiratory distress. He has no wheezes.  GI: Soft. Bowel sounds are normal. He exhibits no distension. There is no tenderness. Musculoskeletal: Normal range of motion. He exhibits no edema.  Neurological: He is alert.  Skin: Skin is warm and dry. He is not diaphoretic.  Psychiatric: He has a normal mood and affect.         ASSESSMENT/ PLAN:  1. Rectal bleeding: he is being followed by GI; is presently not on asa; he has been seen by GI: has had a colonoscopy; has not had any further bleeding..   2. Alzheimer's disease: no change in status: will continue namenda xr 28 mg daily the aricept was stopped due to his rectal bleeding.   3. Diabetes: is stable will continue metformin 500 mg twice daily and will monitor his status. His hgb a1c is 6.1. His ldl is 46; he is presently is on  a statin and is not taking an ace inhibitor.  4. Dyslipidemia: will continue lipitor 10 mg daily ; his ldl is 93   5. Depression: his mood state is stable will continue celexa 20 mg daily will monitor   6. Osteoarthritis: is stable will continue  vicodin 5/325 mg every 4 hours as needed will monitor  His mobic was stopped due to his rectal bleeding.   7. Glaucoma: will continue xalatan to both eyes nightly     Will get a urine for micro-albumin and place him on the foot and eye doctor list.     Ok Edwards NP [facility]  Contact [phone] Monday through Friday 8am- 5pm  After hours call [phone]

## 2014-03-10 ENCOUNTER — Non-Acute Institutional Stay (SKILLED_NURSING_FACILITY): Payer: Managed Care, Other (non HMO) | Admitting: Adult Health

## 2014-03-10 ENCOUNTER — Encounter: Payer: Self-pay | Admitting: Adult Health

## 2014-03-10 DIAGNOSIS — IMO0001 Reserved for inherently not codable concepts without codable children: Secondary | ICD-10-CM

## 2014-03-10 DIAGNOSIS — K625 Hemorrhage of anus and rectum: Secondary | ICD-10-CM

## 2014-03-10 DIAGNOSIS — F329 Major depressive disorder, single episode, unspecified: Secondary | ICD-10-CM

## 2014-03-10 DIAGNOSIS — F0280 Dementia in other diseases classified elsewhere without behavioral disturbance: Secondary | ICD-10-CM

## 2014-03-10 DIAGNOSIS — H409 Unspecified glaucoma: Secondary | ICD-10-CM

## 2014-03-10 DIAGNOSIS — E7800 Pure hypercholesterolemia, unspecified: Secondary | ICD-10-CM

## 2014-03-10 DIAGNOSIS — E1165 Type 2 diabetes mellitus with hyperglycemia: Secondary | ICD-10-CM

## 2014-03-10 DIAGNOSIS — F3289 Other specified depressive episodes: Secondary | ICD-10-CM

## 2014-03-10 DIAGNOSIS — M150 Primary generalized (osteo)arthritis: Secondary | ICD-10-CM

## 2014-03-10 DIAGNOSIS — G309 Alzheimer's disease, unspecified: Secondary | ICD-10-CM

## 2014-03-10 DIAGNOSIS — F0393 Unspecified dementia, unspecified severity, with mood disturbance: Secondary | ICD-10-CM

## 2014-03-10 DIAGNOSIS — M159 Polyosteoarthritis, unspecified: Secondary | ICD-10-CM

## 2014-03-10 NOTE — Progress Notes (Signed)
PCP: Blanchie Serve, MD  Code Status: DNR  No Known Allergies  Chief Complaint:  Medical Management of Chronic Health Issues  HPI:  Derrick Rubio is a 78 yr old male being seen today for a routine visit. Staff reports no new concerns. Pt did have an episode of rectal bleeding last week, however there have been no new episodes since then. He denies abdominal pain or pain with BMs. He is very withdrawn today. Aid giving bath this am and he does appear frustrated. This is not new for him according to staff. He does participate in ADLs most days, however does have days where he does not. He denies chest pain, shortness of breath, or pain.    Review of Systems:  Unable to complete all of ROS.     Past Medical History  Diagnosis Date  . Depression   . Dementia   . Diabetes mellitus without complication   . Latent tuberculosis 09/16/2013  . DVT (deep venous thrombosis) 09/16/2013  . 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 Pure hypercholesterolemia 10/18/2013  . Glaucoma   . Latent tuberculosis   . Hypercholesteremia   . Osteoarthritis 09/16/2013  . Cancer    Past Surgical History  Procedure Laterality Date  . Ivc filter placement    . Colonoscopy with propofol N/A 01/24/2014    Procedure: COLONOSCOPY WITH PROPOFOL;  Surgeon: Garlan Fair, MD;  Location: [facility];  Service: Endoscopy;  Laterality: N/A;   Social History:   reports that he has never smoked. He has never used smokeless tobacco. He reports that he does not drink alcohol or use illicit drugs.  History reviewed. No pertinent family history.  Medications:   Medication List       This list is accurate as of: 03/10/14 10:28 AM.  Always use your most recent med list.               acetaminophen 500 MG tablet  Commonly known as:  TYLENOL  Take 500 mg by mouth every 4 (four) hours as needed for mild pain or fever.     atorvastatin 10 MG tablet  Commonly known as:  LIPITOR  Take 10 mg by mouth daily.     citalopram 20 MG tablet  Commonly known as:  CELEXA  Take 20 mg by mouth every morning.     donepezil 10 MG tablet  Commonly known as:  ARICEPT  Take 10 mg by mouth at bedtime.     HYDROcodone-acetaminophen 5-325 MG per tablet  Commonly known as:  NORCO/VICODIN  Take one tablet by mouth every 4 hours as needed for pain     latanoprost 0.005 % ophthalmic solution  Commonly known as:  XALATAN  Place 1 drop into both eyes at bedtime.     metFORMIN 500 MG tablet  Commonly known as:  GLUCOPHAGE  Take 500 mg by mouth 2 (two) times daily with a meal.     NAMENDA XR 14 MG Cp24  Generic drug:  Memantine HCl ER  Take 28 mg by mouth daily.          Physical Exam:  Filed Vitals:   03/10/14 1023  BP: 126/76  Pulse: 74  Temp: 97.9 F (36.6 C)  Resp: 20  Weight: 175 lb 3.2 oz (79.47 kg)    General- elderly male in no acute distress Neck- no lymphadenopathy, no thyromegaly, no jugular vein distension, no carotid bruit Cardiovascular- normal s1,s2, no murmurs/ rubs/ gallops Respiratory- bilateral clear  to auscultation, no wheeze, no rhonchi, no crackles, no use of accessory muscles Abdomen- bowel sounds present, soft, non tender, no organomegaly, no abdominal bruits, no guarding or rigidity, no rectal bleeding noted during bath, no hemorrhoids present Musculoskeletal- able to move all 4 extremities, wheelchair; requiring max assistance; decreased range of motion, no leg edema Neurological- no focal deficit,3/5 mm strength to lower extremities Skin- warm and dry Psychiatry- alert and oriented to person; withdrawn today   SIGNIFICANT DIAGNOSTIC EXAMS   08-21-13: bilateral lower extremity doppler: No evidence of deep vein or superficial thrombosis involving the right lower extremity and left lower extremity. - No evidence of Baker's cyst on the right or left.  08-21-13: right knee x-ray: No fracture or dislocation. Advanced arthropathic changes. Although this may be  osteoarthritis, CPPD arthropathy should be considered given the pattern. There is a large joint effusion.  11-17-13: kub: 1. Moderate to prominent quantities of fecal material are present in the colon especially the splenic flexure and descending colon area. 2. No signs of mechanical obstruction or ileus can be identified.   01-24-14: colonoscopy: normal; no acute findings    LABS REVIEWED:   08-21-13: wbc 9.1; hgb 14.3; hct 40.3; mcv 91.4 plt 243; glucose 156; bun 9; creat 0.96; k+4.4; na++134; liver normal albumin 3.6  11-01-13: wbc 5.8; hgb 12.8; hct 36.7; mcv 95.4; plt 241; glucose 97; bun 10; creat 0.8; k+4.0; na++136; liver normal albumin 3.5  12-06-13: wbc 5.8; hgb 12.8; hct 39.4; mcv 95.9; plt 235 ; chol 152; ldl 93; trig 53; hgb a1c 6.1  02-15-14: UA: positive nitrates, moderate leukocytes; trace of protein; large amount of blood 02-15-14: wbc 5.2; rbc 4.4; hbg 13.0; hct; 41.5    Assessment/Plan  1. Rectal bleeding: no further bleeding since starting suppositories for bowels; colonoscopy was normal; hgb 13.1. Will continue to monitor  2. Alzheimer's disease: no change in status: will continue namenda xr 28 mg daily   3. Diabetes: is stable will continue metformin 500 mg twice daily and will monitor his status. His hgb a1c is 6.1  4. Dyslipidemia: stable; will continue lipitor 10 mg daily; last ldl 93  5. Depression: usually stable;  will continue celexa 20 mg daily will monitor   6. Osteoarthritis: is stable will continue  vicodin 5/325 mg every 4 hours as needed will monitor.  7. Glaucoma: will continue xalatan to both eyes nightly  [place], Demetrius Charity, [place] NP [facility]  Contact [phone] Monday through Friday 8am- 5pm  After hours call [phone]

## 2014-05-03 LAB — BASIC METABOLIC PANEL
BUN: 13 mg/dL (ref 4–21)
Creatinine: 0.9 mg/dL (ref 0.6–1.3)
Glucose: 99 mg/dL
Potassium: 4 mmol/L (ref 3.4–5.3)
Sodium: 140 mmol/L (ref 137–147)

## 2014-05-03 LAB — CBC AND DIFFERENTIAL
HEMATOCRIT: 38 % — AB (ref 41–53)
HEMOGLOBIN: 12.4 g/dL — AB (ref 13.5–17.5)
Platelets: 263 10*3/uL (ref 150–399)
WBC: 4.8 10*3/mL

## 2014-05-04 ENCOUNTER — Encounter: Payer: Self-pay | Admitting: Registered Nurse

## 2014-05-04 ENCOUNTER — Non-Acute Institutional Stay (SKILLED_NURSING_FACILITY): Payer: Managed Care, Other (non HMO) | Admitting: Registered Nurse

## 2014-05-04 DIAGNOSIS — R61 Generalized hyperhidrosis: Secondary | ICD-10-CM

## 2014-05-04 DIAGNOSIS — E7800 Pure hypercholesterolemia, unspecified: Secondary | ICD-10-CM

## 2014-05-04 DIAGNOSIS — F0280 Dementia in other diseases classified elsewhere without behavioral disturbance: Secondary | ICD-10-CM

## 2014-05-04 DIAGNOSIS — H409 Unspecified glaucoma: Secondary | ICD-10-CM

## 2014-05-04 DIAGNOSIS — E119 Type 2 diabetes mellitus without complications: Secondary | ICD-10-CM

## 2014-05-04 DIAGNOSIS — F329 Major depressive disorder, single episode, unspecified: Secondary | ICD-10-CM

## 2014-05-04 DIAGNOSIS — F0393 Unspecified dementia, unspecified severity, with mood disturbance: Secondary | ICD-10-CM

## 2014-05-04 DIAGNOSIS — M159 Polyosteoarthritis, unspecified: Secondary | ICD-10-CM

## 2014-05-04 DIAGNOSIS — M150 Primary generalized (osteo)arthritis: Secondary | ICD-10-CM

## 2014-05-04 NOTE — Progress Notes (Signed)
Patient ID: Derrick Rubio, male   DOB: 1935/08/20, 78 y.o.   MRN: [id number]   Place of Service: [facility]  No Known Allergies  Code Status: DNR  Goals of Care: Comfort and Quality of Life/Long term care  Chief Complaint  Patient presents with  . Medical Management of Chronic Issues    dementia, depression, DM, HLD, OA    HPI 78 y.o. male with PMH of dementia, depression, DM2, HLD, OA, glaucoma among others is being seen for a routine visit for management of his chronic issues. Staff reported that patient was "sweaty and shaky" on Sunday. CBGs 101-139. Weight overall stable. Has 5 lb weight gain over the past 2 months. No skin concerns reported. No change in functional status or change in behaviors reported.   Review of Systems Unable to obtain  Past Medical History  Diagnosis Date  . Depression   . Dementia   . Diabetes mellitus without complication   . Latent tuberculosis 09/16/2013  . DVT (deep venous thrombosis) 09/16/2013  . 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 Pure hypercholesterolemia 10/18/2013  . Glaucoma   . Latent tuberculosis   . Hypercholesteremia   . Osteoarthritis 09/16/2013  . Cancer     Past Surgical History  Procedure Laterality Date  . Ivc filter placement    . Colonoscopy with propofol N/A 01/24/2014    Procedure: COLONOSCOPY WITH PROPOFOL;  Surgeon: Garlan Fair, MD;  Location: [facility];  Service: Endoscopy;  Laterality: N/A;    History   Social History  . Marital Status: Married    Spouse Name: N/A    Number of Children: N/A  . Years of Education: N/A   Occupational History  . Not on file.   Social History Main Topics  . Smoking status: Never Smoker   . Smokeless tobacco: Never Used  . Alcohol Use: No  . Drug Use: No  . Sexual Activity: Not Currently   Other Topics Concern  . Not on file   Social History Narrative      Medication List       This list is accurate as of: 05/04/14 12:47 AM.  Always use your  most recent med list.               acetaminophen 500 MG tablet  Commonly known as:  TYLENOL  Take 500 mg by mouth every 4 (four) hours as needed for mild pain or fever.     atorvastatin 10 MG tablet  Commonly known as:  LIPITOR  Take 10 mg by mouth daily.     citalopram 20 MG tablet  Commonly known as:  CELEXA  Take 20 mg by mouth every morning.     donepezil 10 MG tablet  Commonly known as:  ARICEPT  Take 10 mg by mouth at bedtime.     HYDROcodone-acetaminophen 5-325 MG per tablet  Commonly known as:  NORCO/VICODIN  Take one tablet by mouth every 4 hours as needed for pain     latanoprost 0.005 % ophthalmic solution  Commonly known as:  XALATAN  Place 1 drop into both eyes at bedtime.     metFORMIN 500 MG tablet  Commonly known as:  GLUCOPHAGE  Take 500 mg by mouth 2 (two) times daily with a meal.     NAMENDA XR 14 MG Cp24  Generic drug:  Memantine HCl ER  Take 28 mg by mouth daily.        Physical Exam Filed Vitals:  05/02/14 1400  BP: 124/76  Pulse: 74  Temp: 98.1 F (36.7 C)  Resp: 20   Constitutional: WDWN elderly male in no acute distress. Pleasant but not very interactive HEENT: Normocephalic and atraumatic. PERRL. EOM intact. No icterus.  Neck: Supple and nontender. No lymphadenopathy, masses, or thyromegaly. No JVD or carotid bruits. Cardiac: Normal S1, S2. RRR without appreciable murmurs, rubs, or gallops. Distal pulses intact. No dependent edema.  Lungs: No respiratory distress. Breath sounds clear bilaterally without rales, rhonchi, or wheezes. Abdomen: Audible bowel sounds in all quadrants. Soft, nontender, nondistended.  Musculoskeletal: able to move all extremities. No joint erythema or tenderness noted on exam Skin: Warm and dry. No diaphoresis Neurological: Alert  Psychiatric:  Appropriate mood and affect.   Labs Reviewed  CBC Latest Ref Rng 02/15/2014 12/06/2013 11/01/2013  WBC - 5.2 5.8 8.5  Hemoglobin 13.5 - 17.5 g/dL 13.0(A) 12.8(A)  12.8(A)  Hematocrit 41 - 53 % 42 39(A) 37(A)  Platelets 150 - 399 K/L 248 - 241    CMP     Component Value Date/Time   NA 136* 11/01/2013   NA 134* 08/21/2013 1325   K 4.0 11/01/2013   CL 96 08/21/2013 1325   CO2 24 08/21/2013 1325   GLUCOSE 156* 08/21/2013 1325   BUN 10 11/01/2013   BUN 9 08/21/2013 1325   CREATININE 0.8 11/01/2013   CREATININE 0.96 08/21/2013 1325   CREATININE 1.12 04/01/2013 1651   CALCIUM 9.6 08/21/2013 1325   PROT 7.9 08/21/2013 1351   ALBUMIN 3.6 08/21/2013 1351   AST 14 11/01/2013   ALT 13 11/01/2013   ALKPHOS 53 11/01/2013   BILITOT 0.5 08/21/2013 1351   GFRNONAA 79* 08/21/2013 1325   GFRNONAA 64 04/01/2013 1651   GFRAA >90 08/21/2013 1325   GFRAA 74 04/01/2013 1651    Lab Results  Component Value Date   HGBA1C 6.1* 12/06/2013   No results found for: TSH   Lipid Panel     Component Value Date/Time   CHOL 152 12/06/2013   TRIG 53 12/06/2013   LDLCALC 93 12/06/2013    Assessment & Plan 1. Depression due to dementia Stable. Continue celexa 20mg  daily and monitor for change in mood.   2. Primary osteoarthritis involving multiple joints Stable. Continue tylenol 500mg  Q4H prn and norco/apap 325mg  Q4H prn. Continue to monitor  3. Pure hypercholesterolemia Stable. Continue lipitor 10mg  daily and monitor.   4. Glaucoma Stable. Continue xalatan 0.005% eye drop daily in both eyes  5. Diabetes type 2, controlled Last a1c 6.1 in 06/15. CBGs range from 101-139. Continue metformin 500mg  BID and monitor.   6. Diaphoresis/shakiness  CBGs range from 101-139. Will check cbc with diff, cmp, and tsh for further evaluation. Continue to monitor for now.    Labs Ordered: CBC with diff., CMP, TSH  Family/Staff Communication Plan of care discuss with nursing staff. Nursing staff verbalize understanding and agree with plan of care. No additional questions or concerns reported.    Arthur Holms, MSN, AGNP-C [facility] [place], [place] [address] [phone] [8am-5pm] After hours: [phone]

## 2014-05-15 ENCOUNTER — Ambulatory Visit: Payer: Self-pay

## 2014-05-15 LAB — URINALYSIS, COMPLETE
Bacteria: NONE SEEN
Bilirubin,UR: NEGATIVE
Blood: NEGATIVE
Glucose,UR: NEGATIVE mg/dL (ref 0–75)
Ketone: NEGATIVE
Leukocyte Esterase: NEGATIVE
Nitrite: NEGATIVE
PH: 5 (ref 4.5–8.0)
Protein: NEGATIVE
SQUAMOUS EPITHELIAL: NONE SEEN
Specific Gravity: 1.021 (ref 1.003–1.030)
WBC UR: <1 /HPF (ref 0–5)

## 2014-05-16 ENCOUNTER — Encounter: Payer: Self-pay | Admitting: Registered Nurse

## 2014-05-16 ENCOUNTER — Non-Acute Institutional Stay (SKILLED_NURSING_FACILITY): Payer: Managed Care, Other (non HMO) | Admitting: Registered Nurse

## 2014-05-16 DIAGNOSIS — R339 Retention of urine, unspecified: Secondary | ICD-10-CM

## 2014-05-16 NOTE — Progress Notes (Signed)
Patient ID: Derrick Rubio, male   DOB: 05/18/1936, 78 y.o.   MRN: [id number]   Place of Service: [facility]  No Known Allergies  Code Status: DNR  Goals of Care: Comfort and Quality of Life/Long term care  Chief Complaint  Patient presents with  . Acute Visit    urinary rentention     HPI 78 y.o. male with PMH of dementia, depression, DM2, HLD, OA, glaucoma among others is being seen for an acute visit for urinary rentention. Per nursing staff, resident has been having issues with urination over the weekend: I&O cath was performed by nursing supervision with 250cc returned on sat's night; urinated x2 overnight and normally throughout the day but only urinated x1 through out sun's night.   Review of Systems Unable to obtain  Past Medical History  Diagnosis Date  . Depression   . Dementia   . Diabetes mellitus without complication   . Latent tuberculosis 09/16/2013  . DVT (deep venous thrombosis) 09/16/2013  . 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 Pure hypercholesterolemia 10/18/2013  . Glaucoma   . Latent tuberculosis   . Hypercholesteremia   . Osteoarthritis 09/16/2013  . Cancer     Past Surgical History  Procedure Laterality Date  . Ivc filter placement    . Colonoscopy with propofol N/A 01/24/2014    Procedure: COLONOSCOPY WITH PROPOFOL;  Surgeon: Garlan Fair, MD;  Location: [facility];  Service: Endoscopy;  Laterality: N/A;    History   Social History  . Marital Status: Married    Spouse Name: N/A    Number of Children: N/A  . Years of Education: N/A   Occupational History  . Not on file.   Social History Main Topics  . Smoking status: Never Smoker   . Smokeless tobacco: Never Used  . Alcohol Use: No  . Drug Use: No  . Sexual Activity: Not Currently   Other Topics Concern  . Not on file   Social History Narrative      Medication List       This list is accurate as of: 05/16/14  1:59 PM.  Always use your most recent med list.               acetaminophen 500 MG tablet  Commonly known as:  TYLENOL  Take 500 mg by mouth every 4 (four) hours as needed for mild pain or fever.     atorvastatin 10 MG tablet  Commonly known as:  LIPITOR  Take 10 mg by mouth daily.     citalopram 20 MG tablet  Commonly known as:  CELEXA  Take 20 mg by mouth every morning.     donepezil 10 MG tablet  Commonly known as:  ARICEPT  Take 10 mg by mouth at bedtime.     HYDROcodone-acetaminophen 5-325 MG per tablet  Commonly known as:  NORCO/VICODIN  Take one tablet by mouth every 4 hours as needed for pain     latanoprost 0.005 % ophthalmic solution  Commonly known as:  XALATAN  Place 1 drop into both eyes at bedtime.     metFORMIN 500 MG tablet  Commonly known as:  GLUCOPHAGE  Take 500 mg by mouth 2 (two) times daily with a meal.     NAMENDA XR 14 MG Cp24  Generic drug:  Memantine HCl ER  Take 28 mg by mouth daily.     tamsulosin 0.4 MG Caps capsule  Commonly known as:  FLOMAX  Take 0.4  mg by mouth daily.        Physical Exam Filed Vitals:   05/16/14 1356  BP: 119/59  Pulse: 67  Temp: 97.7 F (36.5 C)  Resp: 18   Constitutional: WDWN elderly male in no acute distress. Pleasant. HEENT: Normocephalic and atraumatic. PERRL. EOM intact. No icterus.  Neck: Supple and nontender. No lymphadenopathy, masses, or thyromegaly. No JVD or carotid bruits. Cardiac: Normal S1, S2. RRR without appreciable murmurs, rubs, or gallops. Distal pulses intact. No dependent edema.  Lungs: No respiratory distress. Breath sounds clear bilaterally without rales, rhonchi, or wheezes. Abdomen: Audible bowel sounds in all quadrants. Soft, nontender, nondistended.  Musculoskeletal: able to move all extremities. No joint erythema or tenderness noted on exam Skin: Warm and dry. No diaphoresis Neurological: Alert  Psychiatric:  Appropriate mood and affect.   Labs Reviewed  CBC Latest Ref Rng 02/15/2014 12/06/2013 11/01/2013  WBC - 5.2 5.8 8.5   Hemoglobin 13.5 - 17.5 g/dL 13.0(A) 12.8(A) 12.8(A)  Hematocrit 41 - 53 % 42 39(A) 37(A)  Platelets 150 - 399 K/L 248 - 241    CMP Latest Ref Rng 11/01/2013 08/21/2013 06/01/2013  Glucose 70 - 99 mg/dL - 156(H) 111(H)  BUN 4 - 21 mg/dL 10 9 11   Creatinine 0.6 - 1.3 mg/dL 0.8 0.96 1.04  Sodium 137 - 147 mmol/L 136(A) 134(L) 134(L)  Potassium 3.4 - 5.3 mmol/L 4.0 4.4 4.2  Chloride 96 - 112 mEq/L - 96 100  CO2 19 - 32 mEq/L - 24 27  Calcium 8.4 - 10.5 mg/dL - 9.6 9.0  Total Protein 6.0 - 8.3 g/dL - 7.9 -  Total Bilirubin 0.3 - 1.2 mg/dL - 0.5 -  Alkaline Phos 25 - 125 U/L 53 67 -  AST 14 - 40 U/L 14 31 -  ALT 10 - 40 U/L 13 18 -    Lab Results  Component Value Date   HGBA1C 6.1* 12/06/2013   No results found for: TSH   Lipid Panel     Component Value Date/Time   CHOL 152 12/06/2013   TRIG 53 12/06/2013   LDLCALC 93 12/06/2013    Assessment & Plan  1. Urinary retention Questionable BHP. Will check PSA level. Start flomax 0.4mg  daily. Urology referral. Continue to monitor.   Labs Ordered: PSA  Family/Staff Communication Plan of care discuss with nursing staff. Nursing staff verbalize understanding and agree with plan of care. No additional questions or concerns reported.    Arthur Holms, MSN, AGNP-C [facility] [address] [place], [place] [address] [phone] [8am-5pm] After hours: [phone]

## 2014-05-17 LAB — HEMOGLOBIN A1C: Hgb A1c MFr Bld: 6.7 % — AB (ref 4.0–6.0)

## 2014-05-27 ENCOUNTER — Non-Acute Institutional Stay (SKILLED_NURSING_FACILITY): Payer: Managed Care, Other (non HMO) | Admitting: Registered Nurse

## 2014-05-27 DIAGNOSIS — F0393 Unspecified dementia, unspecified severity, with mood disturbance: Secondary | ICD-10-CM

## 2014-05-27 DIAGNOSIS — E7800 Pure hypercholesterolemia, unspecified: Secondary | ICD-10-CM

## 2014-05-27 DIAGNOSIS — M150 Primary generalized (osteo)arthritis: Secondary | ICD-10-CM

## 2014-05-27 DIAGNOSIS — H409 Unspecified glaucoma: Secondary | ICD-10-CM

## 2014-05-27 DIAGNOSIS — F0280 Dementia in other diseases classified elsewhere without behavioral disturbance: Secondary | ICD-10-CM

## 2014-05-27 DIAGNOSIS — G309 Alzheimer's disease, unspecified: Secondary | ICD-10-CM

## 2014-05-27 DIAGNOSIS — F329 Major depressive disorder, single episode, unspecified: Secondary | ICD-10-CM

## 2014-05-27 DIAGNOSIS — N400 Enlarged prostate without lower urinary tract symptoms: Secondary | ICD-10-CM

## 2014-05-27 DIAGNOSIS — M159 Polyosteoarthritis, unspecified: Secondary | ICD-10-CM

## 2014-05-27 DIAGNOSIS — E119 Type 2 diabetes mellitus without complications: Secondary | ICD-10-CM

## 2014-05-28 ENCOUNTER — Encounter: Payer: Self-pay | Admitting: Registered Nurse

## 2014-05-28 DIAGNOSIS — N400 Enlarged prostate without lower urinary tract symptoms: Secondary | ICD-10-CM | POA: Insufficient documentation

## 2014-05-28 NOTE — Progress Notes (Signed)
Patient ID: Ramadan Couey, male   DOB: 02/05/1936, 78 y.o.   MRN: [id number]   Place of Service: [facility]  No Known Allergies  Code Status: DNR  Goals of Care: Comfort and Quality of Life/Long term care  Chief Complaint  Patient presents with  . Medical Management of Chronic Issues    OA, dementia, depression due to demetia, BPH, HLD, glaucoma, DM2, OA    HPI 78 y.o. male with PMH of dementia, depression, DM2, HLD, OA, glaucoma among others is being seen for a routine visit for management of his chronic issues. No recent fall or skin concerns reported. Weight stable. No change in behaviors or functional status reported. BPH stable on flomax, urinary rentention resolved since flomax was started. Depression is stable on celexa. HLD stable on lipitor. Dementia is stable on aricept and namenda xr. No complaints verbalized by patient. No concerns from staff.   Review of Systems Constitutional: Negative for fever and chills HENT: Negative for ear pain and congestion Eyes: Negative for eye pain and eye discharge Cardiovascular: Negative for chest pain Respiratory: Negative cough and shortness of breath Gastrointestinal: Negative for nausea and vomiting. Negative for abdominal pain Genitourinary: Negative for  dysuria EMusculoskeletal: Negative for back pain Neurological: Negative for dizziness and headache Skin: Negative for rash and wound.   Psychiatric: Negative for depression.    Past Medical History  Diagnosis Date  . Depression   . Dementia   . Diabetes mellitus without complication   . Latent tuberculosis 09/16/2013  . DVT (deep venous thrombosis) 09/16/2013  . 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 Pure hypercholesterolemia 10/18/2013  . Glaucoma   . Latent tuberculosis   . Hypercholesteremia   . Osteoarthritis 09/16/2013  . Cancer     Past Surgical History  Procedure Laterality Date  . Ivc filter placement    . Colonoscopy with propofol N/A 01/24/2014   Procedure: COLONOSCOPY WITH PROPOFOL;  Surgeon: Garlan Fair, MD;  Location: [facility];  Service: Endoscopy;  Laterality: N/A;    History   Social History  . Marital Status: Married    Spouse Name: N/A    Number of Children: N/A  . Years of Education: N/A   Occupational History  . Not on file.   Social History Main Topics  . Smoking status: Never Smoker   . Smokeless tobacco: Never Used  . Alcohol Use: No  . Drug Use: No  . Sexual Activity: Not Currently   Other Topics Concern  . Not on file   Social History Narrative      Medication List       This list is accurate as of: 05/27/14 11:59 PM.  Always use your most recent med list.               acetaminophen 500 MG tablet  Commonly known as:  TYLENOL  Take 500 mg by mouth every 4 (four) hours as needed for mild pain or fever.     atorvastatin 10 MG tablet  Commonly known as:  LIPITOR  Take 10 mg by mouth daily.     citalopram 20 MG tablet  Commonly known as:  CELEXA  Take 20 mg by mouth every morning.     donepezil 10 MG tablet  Commonly known as:  ARICEPT  Take 10 mg by mouth at bedtime.     HYDROcodone-acetaminophen 5-325 MG per tablet  Commonly known as:  NORCO/VICODIN  Take one tablet by mouth every 4 hours as needed  for pain     latanoprost 0.005 % ophthalmic solution  Commonly known as:  XALATAN  Place 1 drop into both eyes at bedtime.     metFORMIN 500 MG tablet  Commonly known as:  GLUCOPHAGE  Take 500 mg by mouth 2 (two) times daily with a meal.     NAMENDA XR 14 MG Cp24  Generic drug:  Memantine HCl ER  Take 28 mg by mouth daily.     tamsulosin 0.4 MG Caps capsule  Commonly known as:  FLOMAX  Take 0.4 mg by mouth daily.        Physical Exam  BP 102/64 mmHg  Pulse 73  Temp(Src) 97.1 F (36.2 C)  Resp 20  Ht 6' (1.829 m)  Wt 178 lb 6.4 oz (80.922 kg)  BMI 24.19 kg/m2  SpO2 95%  Constitutional: WDWN elderly male in no acute distress. Conversant and more interactive  this visit. Very pleasant HEENT: Normocephalic and atraumatic. PERRL. EOM intact. No icterus.  Neck: Supple and nontender. No lymphadenopathy, masses, or thyromegaly. No JVD or carotid bruits. Cardiac: Normal S1, S2. RRR without appreciable murmurs, rubs, or gallops. Distal pulses intact. No dependent edema.  Lungs: No respiratory distress. Breath sounds clear bilaterally without rales, rhonchi, or wheezes. Abdomen: Audible bowel sounds in all quadrants. Soft, nontender, nondistended.  Musculoskeletal: able to move all extremities. No joint erythema or tenderness  Skin: Warm and dry. No rash or erythema noted Neurological: Alert  Psychiatric:  Appropriate mood and affect.   Labs Reviewed  CBC Latest Ref Rng 05/03/2014 02/15/2014 12/06/2013  WBC - 4.8 5.2 5.8  Hemoglobin 13.5 - 17.5 g/dL 12.4(A) 13.0(A) 12.8(A)  Hematocrit 41 - 53 % 38(A) 42 39(A)  Platelets 150 - 399 K/L 263 248 -    CMP Latest Ref Rng 05/03/2014 11/01/2013 08/21/2013  Glucose 70 - 99 mg/dL - - 156(H)  BUN 4 - 21 mg/dL 13 10 9   Creatinine 0.6 - 1.3 mg/dL 0.9 0.8 0.96  Sodium 137 - 147 mmol/L 140 136(A) 134(L)  Potassium 3.4 - 5.3 mmol/L 4.0 4.0 4.4  Chloride 96 - 112 mEq/L - - 96  CO2 19 - 32 mEq/L - - 24  Calcium 8.4 - 10.5 mg/dL - - 9.6  Total Protein 6.0 - 8.3 g/dL - - 7.9  Total Bilirubin 0.3 - 1.2 mg/dL - - 0.5  Alkaline Phos 25 - 125 U/L - 53 67  AST 14 - 40 U/L - 14 31  ALT 10 - 40 U/L - 13 18     Lab Results  Component Value Date   HGBA1C 6.7* 05/17/2014    Lipid Panel     Component Value Date/Time   CHOL 152 12/06/2013   TRIG 53 12/06/2013   LDLCALC 93 12/06/2013    Assessment & Plan 1. Diabetes type 2, controlled A1c increased from 6.1 to 6.7. cbgs range from 90s to 130s. Will not make any change at this time. Continue metformin 500mg  twice daily and monitor. Recheck a1c in 3 months, if continue to trending up, will increase metformin dosage. Not on ACEI or ARB. Will check urine  microalbumin/creatinine ratio. Continue statin for lipids. Continue to monitor  2. Depression due to dementia Stable. Continue celexa 20mg  daily and monitor for change in mood. Continue to monitor  3. Alzheimer's dementia without behavioral disturbance Stable. More alert and interactive today than during his last visit. Continue aricept 10mg  daily and namenda xr 28mg  daily. Continue to monitor for change in behaviors. Continue fall  risk and pressure ulcer prophylaxis. Continue to monitor.   4. Primary osteoarthritis involving multiple joints Denies pain today. Continue tylenol 500mg  every four hours as needed and norco 5/325mg  every four hours as needed for pain. Continue to monitor  5. Glaucoma No issues continue xalatan 0.005% daily in both eyes and monitor  6. Pure hypercholesterolemia Stable. Continue lipitor daily and monitor.   7. BPH (benign prostatic hyperplasia) Stable. Continue flomax 0.4mg  daily and monitor  Labs ordered: urine microalbumin/creatitine ratio  Family/Staff Communication Plan of care discussed with patient and nursing staff. Patient and nursing staff verbalized understanding and agree with plan of care. No additional questions or concerns reported.    Arthur Holms, MSN, AGNP-C [facility] [address] [place], [place] [address] [phone] [8am-5pm] After hours: [phone]

## 2014-05-30 ENCOUNTER — Encounter: Payer: Self-pay | Admitting: Internal Medicine

## 2014-06-27 ENCOUNTER — Encounter: Payer: Self-pay | Admitting: Registered Nurse

## 2014-06-27 NOTE — Progress Notes (Deleted)
Patient ID: Derrick Rubio, male   DOB: Aug 24, 1935, 79 y.o.   MRN: [id number]   Place of Service: [facility]  No Known Allergies  Code Status: DNR  Goals of Care: Comfort and Quality of Life/Long term care  Chief Complaint  Patient presents with  . Medical Management of Chronic Issues    DM2, depression, dementia, OA, glaucoma, HLD, BPH    HPI 79 y.o. male with PMH of dementia, depression, DM2, HLD, OA, glaucoma among others is being seen for a routine visit for management of his chronic issues. No recent fall or skin concerns reported. Weight overall stable. No change in behaviors or functional status reported. No concerns from staff. Depression stable on celexa. Glaucoma stable on xalatan eye gtt. HLD stable on lipitor. BPH stable on flomax. Dementia is stable on aricept and namenda xr. Seen in wheelchair today. No complaints verbalized by patient.    Review of Systems Constitutional: Negative for fever and chills HENT: Negative for ear pain and congestion Eyes: Negative for eye pain and eye discharge Cardiovascular: Negative for chest pain Respiratory: Negative cough and shortness of breath Gastrointestinal: Negative for nausea and vomiting. Negative for abdominal pain. Negative for diarrhea and constipation.  Genitourinary: Negative for  dysuria EMusculoskeletal: Negative for back pain Neurological: Negative for dizziness and headache Skin: Negative for rash and wound.   Psychiatric: Negative for depression.    Past Medical History  Diagnosis Date  . Depression   . Dementia   . Diabetes mellitus without complication   . Latent tuberculosis 09/16/2013  . DVT (deep venous thrombosis) 09/16/2013  . 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 Pure hypercholesterolemia 10/18/2013  . Glaucoma   . Latent tuberculosis   . Hypercholesteremia   . Osteoarthritis 09/16/2013  . Cancer     Past Surgical History  Procedure Laterality Date  . Ivc filter placement    .  Colonoscopy with propofol N/A 01/24/2014    Procedure: COLONOSCOPY WITH PROPOFOL;  Surgeon: Garlan Fair, MD;  Location: [facility];  Service: Endoscopy;  Laterality: N/A;    History   Social History  . Marital Status: Married    Spouse Name: N/A    Number of Children: N/A  . Years of Education: N/A   Occupational History  . Not on file.   Social History Main Topics  . Smoking status: Never Smoker   . Smokeless tobacco: Never Used  . Alcohol Use: No  . Drug Use: No  . Sexual Activity: Not Currently   Other Topics Concern  . Not on file   Social History Narrative      Medication List       This list is accurate as of: 06/27/14  8:21 PM.  Always use your most recent med list.               acetaminophen 500 MG tablet  Commonly known as:  TYLENOL  Take 500 mg by mouth every 4 (four) hours as needed for mild pain or fever.     atorvastatin 10 MG tablet  Commonly known as:  LIPITOR  Take 10 mg by mouth daily.     citalopram 20 MG tablet  Commonly known as:  CELEXA  Take 20 mg by mouth every morning.     donepezil 10 MG tablet  Commonly known as:  ARICEPT  Take 10 mg by mouth at bedtime.     HYDROcodone-acetaminophen 5-325 MG per tablet  Commonly known as:  NORCO/VICODIN  Take  one tablet by mouth every 4 hours as needed for pain     latanoprost 0.005 % ophthalmic solution  Commonly known as:  XALATAN  Place 1 drop into both eyes at bedtime.     metFORMIN 500 MG tablet  Commonly known as:  GLUCOPHAGE  Take 500 mg by mouth 2 (two) times daily with a meal.     NAMENDA XR 14 MG Cp24 24 hr capsule  Generic drug:  memantine  Take 28 mg by mouth daily.     tamsulosin 0.4 MG Caps capsule  Commonly known as:  FLOMAX  Take 0.4 mg by mouth daily.        Physical Exam  BP 113/78 mmHg  Pulse 72  Temp(Src) 98.1 F (36.7 C)  Resp 18  Ht 6' (1.829 m)  Wt 180 lb 3.2 oz (81.738 kg)  BMI 24.43 kg/m2  SpO2 95%  Constitutional: WDWN elderly male in no  acute distress. Conversant and pleasant.  HEENT: Normocephalic and atraumatic. PERRL. EOM intact. No icterus.  Neck: Supple and nontender. No lymphadenopathy, masses, or thyromegaly. No JVD or carotid bruits. Cardiac: Normal S1, S2. RRR without appreciable murmurs, rubs, or gallops. Distal pulses intact. No dependent edema.  Lungs: No respiratory distress. Breath sounds clear bilaterally without rales, rhonchi, or wheezes. Abdomen: Audible bowel sounds in all quadrants. Soft, nontender, nondistended.  Musculoskeletal: able to move all extremities. No joint erythema or tenderness  Skin: Warm and dry. No rash or erythema noted Neurological: Alert and oriented to self  Psychiatric:  Appropriate mood and affect.   Labs Reviewed  CBC Latest Ref Rng 05/03/2014 02/15/2014 12/06/2013  WBC - 4.8 5.2 5.8  Hemoglobin 13.5 - 17.5 g/dL 12.4(A) 13.0(A) 12.8(A)  Hematocrit 41 - 53 % 38(A) 42 39(A)  Platelets 150 - 399 K/L 263 248 -    CMP Latest Ref Rng 05/03/2014 11/01/2013 08/21/2013  Glucose 70 - 99 mg/dL - - 156(H)  BUN 4 - 21 mg/dL 13 10 9   Creatinine 0.6 - 1.3 mg/dL 0.9 0.8 0.96  Sodium 137 - 147 mmol/L 140 136(A) 134(L)  Potassium 3.4 - 5.3 mmol/L 4.0 4.0 4.4  Chloride 96 - 112 mEq/L - - 96  CO2 19 - 32 mEq/L - - 24  Calcium 8.4 - 10.5 mg/dL - - 9.6  Total Protein 6.0 - 8.3 g/dL - - 7.9  Total Bilirubin 0.3 - 1.2 mg/dL - - 0.5  Alkaline Phos 25 - 125 U/L - 53 67  AST 14 - 40 U/L - 14 31  ALT 10 - 40 U/L - 13 18     Lab Results  Component Value Date   HGBA1C 6.7* 05/17/2014    Lipid Panel     Component Value Date/Time   CHOL 152 12/06/2013   TRIG 53 12/06/2013   LDLCALC 93 12/06/2013    Assessment & Plan 1. Diabetes type 2, controlled Recent a1c 6.7. cbg range 90s to 130sContinue metformin 500mg  twice daily and monitor. Continue statin for lipids. Continue to monitor  2. Depression due to dementia Stable. Continue celexa 20mg  daily and monitor for change in mood. Continue to  monitor  3. Alzheimer's dementia without behavioral disturbance Stable. Continue aricept 10mg  daily and namenda xr 28mg  daily. Continue to monitor for change in behavior. Continue fall risk and pressure ulcer prophylaxis.   4. Primary osteoarthritis involving multiple joints Stable. Continue tylenol 500mg  every four hours as needed and norco 5/325mg  every four hours as needed for pain.   5. Glaucoma Stable.  Continue xalatan 0.005% daily in both eyes and monitor  6. HLD Stable. Continue lipitor 10mg   daily and monitor.   7. BPH (benign prostatic hyperplasia) Stable. Continue flomax 0.4mg  daily and monitor   Family/Staff Communication Plan of care discussed with patient and nursing staff. Patient and nursing staff verbalized understanding and agree with plan of care. No additional questions or concerns reported.    Arthur Holms, MSN, AGNP-C [facility] [address] [place], [place] [address] [phone] [8am-5pm] After hours: [phone]

## 2014-06-27 NOTE — Progress Notes (Signed)
This encounter was created in error - please disregard.

## 2014-07-06 ENCOUNTER — Non-Acute Institutional Stay (SKILLED_NURSING_FACILITY): Payer: Medicare Other | Admitting: Internal Medicine

## 2014-07-06 DIAGNOSIS — E119 Type 2 diabetes mellitus without complications: Secondary | ICD-10-CM

## 2014-07-06 DIAGNOSIS — M150 Primary generalized (osteo)arthritis: Secondary | ICD-10-CM

## 2014-07-06 DIAGNOSIS — E7800 Pure hypercholesterolemia, unspecified: Secondary | ICD-10-CM

## 2014-07-06 DIAGNOSIS — F329 Major depressive disorder, single episode, unspecified: Secondary | ICD-10-CM

## 2014-07-06 DIAGNOSIS — G309 Alzheimer's disease, unspecified: Secondary | ICD-10-CM

## 2014-07-06 DIAGNOSIS — N400 Enlarged prostate without lower urinary tract symptoms: Secondary | ICD-10-CM | POA: Diagnosis not present

## 2014-07-06 DIAGNOSIS — F0280 Dementia in other diseases classified elsewhere without behavioral disturbance: Secondary | ICD-10-CM | POA: Diagnosis not present

## 2014-07-06 DIAGNOSIS — F0393 Unspecified dementia, unspecified severity, with mood disturbance: Secondary | ICD-10-CM

## 2014-07-06 DIAGNOSIS — M159 Polyosteoarthritis, unspecified: Secondary | ICD-10-CM

## 2014-07-09 NOTE — Progress Notes (Signed)
Patient ID: Derrick Rubio, male   DOB: [birth year], 79 y.o.   MRN: [id number]    Facility: [facility]   Chief Complaint  Patient presents with  . Medical Management of Chronic Issues   No Known Allergies  HPI 79 y.o. male patient is seen today for routine visit. He is seen in his room today. He denies any concerns. Weight has been stable. No falls reported. No new skin concerns. He has medical history of vascular dementia, depression, DVT s/p IVC filter, DM2, HLD among others. No concerns from staff.  ROS: limited with dementia Constitutional: Negative for fever and chills Cardiovascular: Negative for chest pain Respiratory: Negative cough and shortness of breath Gastrointestinal: Negative for nausea, vomiting, abdominal pain Neurological: Negative for dizziness and headache Skin: Negative for rash and wound.   Psychiatric: Negative for depression.   Past Medical History  Diagnosis Date  . Depression   . Dementia   . Diabetes mellitus without complication   . Latent tuberculosis 09/16/2013  . DVT (deep venous thrombosis) 09/16/2013  . 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 Pure hypercholesterolemia 10/18/2013  . Glaucoma   . Latent tuberculosis   . Hypercholesteremia   . Osteoarthritis 09/16/2013  . Cancer    Medication reviewed. See [facility]  Physical exam BP 119/73 mmHg  Pulse 78  Temp(Src) 98 F (36.7 C)  Resp 18  SpO2 96%  General- elderly male in no acute distress Head- atraumatic, normocephalic Eyes- PERRLA, EOMI, no pallor, no icterus Neck- no lymphadenopathy Chest- no chest wall deformities, no chest wall tenderness Cardiovascular- normal s1,s2, no murmurs/ rubs/ gallops, intact distal pulses Respiratory- bilateral clear to auscultation, no wheeze, no rhonchi, no crackles Abdomen- bowel sounds present, soft, non tender Musculoskeletal- able to move all 4 extremities, no leg edema,lower extremity weakness present, on  wheelchair Neurological- alert but has dementia Psychiatry- normal mood and affect  Assessment/plan  Diabetes type 2 Continue metformin 500 mg bid, monitor cbg.  Hyperlipidemia Continue lipitor 10 mg daily  Depression with dementia Controlled. Continue celexa 20 mg daily for now and can attempt GDR next visit  Dementia Likely a mixture of alzhimer and vascular. Stable, continue aricept and namenda, decline anticipated, monitor weight, po intake, continue skin care and fall precautions  bph  Continue flomax for now and monitor  Osteoarthritis  involving multiple joints. Continue prn tyleno and norco.

## 2014-08-03 LAB — CBC AND DIFFERENTIAL
HCT: 43 % (ref 41–53)
Hemoglobin: 13.9 g/dL (ref 13.5–17.5)

## 2014-08-03 LAB — BASIC METABOLIC PANEL: Creatinine: 1 mg/dL (ref <=1.3)

## 2014-08-04 LAB — HEMOGLOBIN A1C: Hgb A1c MFr Bld: 6.5 % — AB (ref 4.0–6.0)

## 2014-08-29 ENCOUNTER — Non-Acute Institutional Stay (SKILLED_NURSING_FACILITY): Payer: Medicare Other | Admitting: Internal Medicine

## 2014-08-29 DIAGNOSIS — E1151 Type 2 diabetes mellitus with diabetic peripheral angiopathy without gangrene: Secondary | ICD-10-CM | POA: Diagnosis not present

## 2014-08-29 DIAGNOSIS — E785 Hyperlipidemia, unspecified: Secondary | ICD-10-CM | POA: Diagnosis not present

## 2014-08-29 DIAGNOSIS — M150 Primary generalized (osteo)arthritis: Secondary | ICD-10-CM | POA: Diagnosis not present

## 2014-08-29 DIAGNOSIS — M159 Polyosteoarthritis, unspecified: Secondary | ICD-10-CM

## 2014-08-29 DIAGNOSIS — I798 Other disorders of arteries, arterioles and capillaries in diseases classified elsewhere: Secondary | ICD-10-CM

## 2014-09-22 ENCOUNTER — Other Ambulatory Visit: Payer: Self-pay | Admitting: *Deleted

## 2014-09-22 MED ORDER — LORAZEPAM 0.5 MG PO TABS: ORAL_TABLET | ORAL | Status: DC

## 2014-09-22 NOTE — Telephone Encounter (Signed)
[facility] 

## 2014-09-26 ENCOUNTER — Non-Acute Institutional Stay (SKILLED_NURSING_FACILITY): Payer: Medicare Other | Admitting: Internal Medicine

## 2014-09-26 DIAGNOSIS — IMO0002 Reserved for concepts with insufficient information to code with codable children: Secondary | ICD-10-CM

## 2014-09-26 DIAGNOSIS — M1710 Unilateral primary osteoarthritis, unspecified knee: Secondary | ICD-10-CM

## 2014-09-26 DIAGNOSIS — F0150 Vascular dementia without behavioral disturbance: Secondary | ICD-10-CM | POA: Diagnosis not present

## 2014-09-26 DIAGNOSIS — M179 Osteoarthritis of knee, unspecified: Secondary | ICD-10-CM | POA: Diagnosis not present

## 2014-09-26 DIAGNOSIS — R4589 Other symptoms and signs involving emotional state: Secondary | ICD-10-CM

## 2014-09-26 DIAGNOSIS — F329 Major depressive disorder, single episode, unspecified: Secondary | ICD-10-CM | POA: Diagnosis not present

## 2014-09-26 DIAGNOSIS — F0280 Dementia in other diseases classified elsewhere without behavioral disturbance: Secondary | ICD-10-CM | POA: Diagnosis not present

## 2014-09-26 NOTE — Progress Notes (Signed)
Patient ID: Derrick Rubio, male   DOB: 11/12/1935, 79 y.o.   MRN: [id number]  Facility: [facility]  Chief complaint: medical management of chronic issues  Allergies: reviewed, NKDA  Code status:DNR  HPI 79 y.o. male patient is seen today for routine visit. He has past medical history of vascular dementia, depression, DVT s/p IVC filter, DM2, HLD among others. No concerns from staff. Recently seen by PA for left wrist swelling and he mentions this has subsided. Weight remains stable, cbg reviewed, tolerating metformin well. No new skin concerns or behavior concerns  ROS: limited with dementia Constitutional: Negative for fever and chills Cardiovascular: Negative for chest pain Respiratory: Negative cough and shortness of breath Gastrointestinal: Negative for nausea, vomiting, abdominal pain.  Neurological: Negative for dizziness and headache Skin: Negative for rash and wound.    Past medical history reviewed  Medication reviewed. See [facility]  Physical exam BP 130/79 mmHg  Pulse 76  Temp(Src) 98 F (36.7 C)  Resp 19  SpO2 95%  General- elderly male in no acute distress Head- atraumatic, normocephalic Cardiovascular- normal s1,s2, no murmurs/, intact distal pulses Respiratory- bilateral clear to auscultation, no wheeze, no rhonchi, no crackles Abdomen- bowel sounds present, soft, non tender Musculoskeletal- able to move all 4 extremities, no leg edema,lower extremity weakness present, on wheelchair, trace swelling in left wrist but has normal ROM Neurological- alert and oriented to person Psychiatry- flat affect  Labs 08/03/14 wbc 6.3, hb 13.9, hct 42.6, plt 285, na 141, k 4.2, bun 11, cr 0.96, glu 77, ca 9.7, a1c 6.5, lft wnl  Assessment/plan  Hyperlipidemia Continue lipitor 10 mg daily and monitor lipid panel  Type 2 DM Reviewed a1c, continue metformin 500 mg bid and statin. If a1c remains < 7 on next lab, consider changing  metformin to once a day dosing  OA Stable on current regimen of norco and tylenol

## 2014-09-26 NOTE — Progress Notes (Signed)
Patient ID: Derrick Rubio, male   DOB: [birth year], 79 y.o.   MRN: [id number]    Facility: [facility]  Chief complaint: medical management of chronic issues  Allergies: reviewed, NKDA  Code status:DNR  HPI 79 y.o. male patient is seen today for routine visit. He denies any concerns. Weight has been stable. No falls reported. No new skin concerns. He has past medical history of vascular dementia, depression, DVT s/p IVC filter, DM2, HLD among others. No concerns from staff.  ROS: limited with dementia Constitutional: Negative for fever and chills Cardiovascular: Negative for chest pain Respiratory: Negative cough and shortness of breath Gastrointestinal: Negative for nausea, vomiting, abdominal pain. Had bowel movement yesterday Neurological: Negative for dizziness and headache Skin: Negative for rash and wound.    Past medical history reviewed  Medication reviewed. See [facility]   Medication List       This list is accurate as of: 09/26/14  3:15 PM.  Always use your most recent med list.               acetaminophen 500 MG tablet  Commonly known as:  TYLENOL  Take 500 mg by mouth every 4 (four) hours as needed for mild pain or fever.     atorvastatin 10 MG tablet  Commonly known as:  LIPITOR  Take 10 mg by mouth daily.     citalopram 20 MG tablet  Commonly known as:  CELEXA  Take 20 mg by mouth every morning.     HYDROcodone-acetaminophen 5-325 MG per tablet  Commonly known as:  NORCO/VICODIN  Take one tablet by mouth every 4 hours as needed for pain     latanoprost 0.005 % ophthalmic solution  Commonly known as:  XALATAN  Place 1 drop into both eyes at bedtime.     metFORMIN 500 MG tablet  Commonly known as:  GLUCOPHAGE  Take 500 mg by mouth 2 (two) times daily with a meal.     NAMENDA XR 14 MG Cp24 24 hr capsule  Generic drug:  memantine  Take 28 mg by mouth daily.     tamsulosin 0.4 MG Caps capsule  Commonly known  as:  FLOMAX  Take 0.4 mg by mouth daily.        Physical exam BP 126/71 mmHg  Pulse 74  Temp(Src) 97.6 F (36.4 C)  Resp 18  General- elderly male in no acute distress Head- atraumatic, normocephalic Eyes- PERRLA, EOMI, no pallor, no icterus Neck- no lymphadenopathy Cardiovascular- normal s1,s2, no murmurs/, intact distal pulses Respiratory- bilateral clear to auscultation, no wheeze, no rhonchi, no crackles Abdomen- bowel sounds present, soft, non tender Musculoskeletal- able to move all 4 extremities, no leg edema,lower extremity weakness present, on wheelchair Neurological- alert and oriented to person Psychiatry- flat affect  Labs 08/03/14 wbc 6.3, hb 13.9, hct 42.6, plt 285, na 141, k 4.2, bun 11, cr 0.96, glu 77, ca 9.7, a1c 6.5, lft wnl  Assessment/plan  Depression with dementia Appears to have worsened, increase celexa to 40 mg daily and reassess.   Dementia Ongoing, a mixture of alzhimer and vascular. Stable, continue namenda xr, decline anticipated, monitor weight, po intake, continue skin care and fall precautions and to provide assistance with his ADLs  Osteoarthritis   involving multiple joints. Continue norco 5-325 q4h prn and tylenol 500 mg q4h prn for pain.

## 2014-10-24 ENCOUNTER — Non-Acute Institutional Stay (SKILLED_NURSING_FACILITY): Payer: Medicare Other | Admitting: Internal Medicine

## 2014-10-24 DIAGNOSIS — F329 Major depressive disorder, single episode, unspecified: Secondary | ICD-10-CM

## 2014-10-24 DIAGNOSIS — E1151 Type 2 diabetes mellitus with diabetic peripheral angiopathy without gangrene: Secondary | ICD-10-CM

## 2014-10-24 DIAGNOSIS — F0280 Dementia in other diseases classified elsewhere without behavioral disturbance: Secondary | ICD-10-CM | POA: Diagnosis not present

## 2014-10-24 DIAGNOSIS — N400 Enlarged prostate without lower urinary tract symptoms: Secondary | ICD-10-CM | POA: Insufficient documentation

## 2014-10-24 DIAGNOSIS — E785 Hyperlipidemia, unspecified: Secondary | ICD-10-CM | POA: Diagnosis not present

## 2014-10-24 DIAGNOSIS — F0393 Unspecified dementia, unspecified severity, with mood disturbance: Secondary | ICD-10-CM

## 2014-10-24 DIAGNOSIS — R196 Halitosis: Secondary | ICD-10-CM

## 2014-10-24 DIAGNOSIS — I798 Other disorders of arteries, arterioles and capillaries in diseases classified elsewhere: Secondary | ICD-10-CM

## 2014-10-24 NOTE — Progress Notes (Signed)
Patient ID: Derrick Rubio, male   DOB: 06-04-1936, 79 y.o.   MRN: [id number]     Facility: [facility]  Chief complaint: medical management of chronic issues  Allergies: reviewed, NKDA  Code status:DNR  HPI 79 y.o. male patient is seen today for routine visit. He denies any concerns. He is pleasantly confused. Has gained 5 lbs in a month. No falls reported. No new skin concerns. He has past medical history of vascular dementia, depression, DVT s/p IVC filter, DM2, HLD among others. No concerns from staff.  ROS: limited with dementia Constitutional: Negative for fever and chills Cardiovascular: Negative for chest pain Respiratory: Negative cough and shortness of breath Gastrointestinal: Negative for nausea, vomiting, abdominal pain. Had bowel movement yesterday Neurological: Negative for dizziness and headache Skin: Negative for rash and wound.    Past medical history reviewed  Medication reviewed. See [facility]   Medication List       This list is accurate as of: 10/24/14  3:58 PM.  Always use your most recent med list.               acetaminophen 500 MG tablet  Commonly known as:  TYLENOL  Take 500 mg by mouth every 4 (four) hours as needed for mild pain or fever.     atorvastatin 10 MG tablet  Commonly known as:  LIPITOR  Take 10 mg by mouth daily.     citalopram 20 MG tablet  Commonly known as:  CELEXA  Take 20 mg by mouth every morning.     HYDROcodone-acetaminophen 5-325 MG per tablet  Commonly known as:  NORCO/VICODIN  Take one tablet by mouth every 4 hours as needed for pain     latanoprost 0.005 % ophthalmic solution  Commonly known as:  XALATAN  Place 1 drop into both eyes at bedtime.     metFORMIN 500 MG tablet  Commonly known as:  GLUCOPHAGE  Take 500 mg by mouth 2 (two) times daily with a meal.     NAMENDA XR 14 MG Cp24 24 hr capsule  Generic drug:  memantine  Take 28 mg by mouth daily.     tamsulosin 0.4  MG Caps capsule  Commonly known as:  FLOMAX  Take 0.4 mg by mouth daily.        Physical exam BP 124/76 mmHg  Pulse 84  Temp(Src) 97 F (36.1 C)  Resp 18  Ht 6' (1.829 m)  Wt 190 lb 3.2 oz (86.274 kg)  BMI 25.79 kg/m2  SpO2 95%  Wt Readings from Last 3 Encounters:  10/24/14 190 lb 3.2 oz (86.274 kg)  06/27/14 180 lb 3.2 oz (81.738 kg)  05/27/14 178 lb 6.4 oz (80.922 kg)   General- elderly male in no acute distress Head- atraumatic, normocephalic Eyes- PERRLA, EOMI, no pallor, no icterus Mouth- halitosis +, adentulous, normal oropharynx Neck- no lymphadenopathy Cardiovascular- normal s1,s2, no murmurs/, intact distal pulses Respiratory- bilateral clear to auscultation, no wheeze, no rhonchi, no crackles Abdomen- bowel sounds present, soft, non tender Musculoskeletal- able to move all 4 extremities, no leg edema,lower extremity weakness present, on wheelchair Neurological- alert and oriented to person Psychiatry- flat affect  Labs 08/03/14 wbc 6.3, hb 13.9, hct 42.6, plt 285, na 141, k 4.2, bun 11, cr 0.96, glu 77, ca 9.7, a1c 6.5, lft wnl   Assessment/plan  Halitosis Will need oral hygiene to be provided after each meal for now. peridex tid for now x 2 weeks, then bid  x 2 weeks, then daily for a week.Deberah Castle  BPH No signs of obstruction. Stable, continue flomax 0.4 mg daily  Hyperlipidemia On lipitor 10 mg daily, will need recheck of lipid panel  Dm type 2 Reviewed a1c s/o controlled diabetes. Continue metformin 500 mg bid. Goal a1c < 7 given his age. If recheck of a1c in 5/16 has a1c < 7, will recommend decreasing metformin to 50 mg daily. Continue cbg check. Check lipid panel and a1c next lab. Normal urine microalbumin 05/30/14. Continue statin. Aspirin declined by family. uptodate with foot and eye exam  Depression with dementia In better mood today. Continue celexa 40 mg daily. Pending ekg for QTc interval monitoring    Blanchie Serve, MD  [facility]  Adult Medicine [phone] (Monday-Friday 8 am - 5 pm) [phone] (afterhours)

## 2014-11-28 ENCOUNTER — Non-Acute Institutional Stay (SKILLED_NURSING_FACILITY): Payer: Medicare Other | Admitting: Internal Medicine

## 2014-11-28 ENCOUNTER — Encounter: Payer: Self-pay | Admitting: Internal Medicine

## 2014-11-28 DIAGNOSIS — H409 Unspecified glaucoma: Secondary | ICD-10-CM | POA: Diagnosis not present

## 2014-11-28 DIAGNOSIS — N400 Enlarged prostate without lower urinary tract symptoms: Secondary | ICD-10-CM | POA: Diagnosis not present

## 2014-11-28 DIAGNOSIS — E785 Hyperlipidemia, unspecified: Secondary | ICD-10-CM

## 2014-11-28 NOTE — Progress Notes (Signed)
Patient ID: Derrick Rubio, male   DOB: 04/27/1936, 79 y.o.   MRN: [id number]    Facility: [facility]  Chief complaint: medical management of chronic issues  Allergies: reviewed, NKDA  Code status:DNR  HPI 79 y.o. male patient is seen today for routine visit. He denies any concerns. No falls reported. No new skin concerns. He has past medical history of vascular dementia, depression, DVT s/p IVC filter, DM2, HLD among others. No concerns from staff.  ROS: limited with dementia Constitutional: Negative for fever and chills Cardiovascular: Negative for chest pain Respiratory: Negative cough and shortness of breath Gastrointestinal: Negative for nausea, vomiting, abdominal pain. Had bowel movement yesterday Neurological: Negative for dizziness and headache Skin: Negative for rash and wound.    Past medical history reviewed  Medication reviewed. See [facility]   Medication List       This list is accurate as of: 11/28/14 11:59 PM.  Always use your most recent med list.               acetaminophen 500 MG tablet  Commonly known as:  TYLENOL  Take 500 mg by mouth every 4 (four) hours as needed for mild pain or fever.     atorvastatin 10 MG tablet  Commonly known as:  LIPITOR  Take 10 mg by mouth daily.     citalopram 20 MG tablet  Commonly known as:  CELEXA  Take 20 mg by mouth every morning.     HYDROcodone-acetaminophen 5-325 MG per tablet  Commonly known as:  NORCO/VICODIN  Take one tablet by mouth every 4 hours as needed for pain     latanoprost 0.005 % ophthalmic solution  Commonly known as:  XALATAN  Place 1 drop into both eyes at bedtime.     LORazepam 0.5 MG tablet  Commonly known as:  ATIVAN  Take 0.5 mg by mouth every 6 (six) hours as needed for anxiety.     metFORMIN 500 MG tablet  Commonly known as:  GLUCOPHAGE  Take 500 mg by mouth 2 (two) times daily with a meal.     NAMENDA XR 28 MG Cp24 24 hr capsule  Generic  drug:  memantine  Take 28 mg by mouth daily. For Dementia     tamsulosin 0.4 MG Caps capsule  Commonly known as:  FLOMAX  Take 0.4 mg by mouth daily.        Physical exam BP 116/82 mmHg  Pulse 68  Temp(Src) 97.6 F (36.4 C) (Oral)  Resp 18  SpO2 99%   General- elderly male in no acute distress Head- atraumatic, normocephalic Eyes- PERRLA, EOMI, no pallor, no icterus Cardiovascular- normal s1,s2, no murmurs/, intact distal pulses Respiratory- bilateral clear to auscultation, no wheeze, no rhonchi, no crackles Abdomen- bowel sounds present, soft, non tender Musculoskeletal- able to move all 4 extremities, no leg edema,lower extremity weakness present, on wheelchair Neurological- alert and oriented to person   Assessment/plan  BPH Stable, continue current regimen for flomax and monitor  Hyperlipidemia On lipitor 10 mg daily, will need recheck of lipid panel  Glaucoma Stable, continue latanoprost eye drop and monitor   Blanchie Serve, MD  [facility] Adult Medicine [phone] (Monday-Friday 8 am - 5 pm) [phone] (afterhours)

## 2014-12-08 LAB — BASIC METABOLIC PANEL
BUN: 10 mg/dL (ref 4–21)
CREATININE: 1 mg/dL (ref <=1.3)
GLUCOSE: 106 mg/dL
Potassium: 4.1 mmol/L (ref 3.4–5.3)

## 2014-12-08 LAB — CBC AND DIFFERENTIAL
HCT: 45 % (ref 41–53)
Hemoglobin: 15.2 g/dL (ref 13.5–17.5)
Platelets: 291 10*3/uL (ref 150–399)
WBC: 5.9 10*3/mL

## 2014-12-08 LAB — HEPATIC FUNCTION PANEL
ALT: 22 U/L (ref 10–40)
AST: 19 U/L (ref 14–40)
Alkaline Phosphatase: 78 U/L (ref 25–125)

## 2014-12-08 LAB — HEMOGLOBIN A1C: Hgb A1c MFr Bld: 6.8 % — AB (ref 4.0–6.0)

## 2015-01-05 ENCOUNTER — Non-Acute Institutional Stay (SKILLED_NURSING_FACILITY): Payer: Medicare Other | Admitting: Internal Medicine

## 2015-01-05 DIAGNOSIS — N400 Enlarged prostate without lower urinary tract symptoms: Secondary | ICD-10-CM

## 2015-01-05 DIAGNOSIS — G309 Alzheimer's disease, unspecified: Secondary | ICD-10-CM | POA: Diagnosis not present

## 2015-01-05 DIAGNOSIS — F0280 Dementia in other diseases classified elsewhere without behavioral disturbance: Secondary | ICD-10-CM

## 2015-01-05 DIAGNOSIS — E1151 Type 2 diabetes mellitus with diabetic peripheral angiopathy without gangrene: Secondary | ICD-10-CM

## 2015-01-05 DIAGNOSIS — I798 Other disorders of arteries, arterioles and capillaries in diseases classified elsewhere: Secondary | ICD-10-CM

## 2015-01-05 NOTE — Progress Notes (Signed)
Patient ID: Cline Draheim, male   DOB: 1936/05/18, 79 y.o.   MRN: [id number]    Facility: [facility]  Chief complaint: medical management of chronic issues  Allergies: reviewed, NKDA  Code status:DNR  HPI 79 y.o. male patient is seen today for routine visit. He is pleasantly confused with his dementia, sitting on wheelchair and in no distress. No new concern from staff. No falls reported. No new skin concerns. Weight has been stable. He has past medical history of vascular dementia, depression, DVT s/p IVC filter, DM2, HLD among others.   ROS: limited with dementia Constitutional: Negative for fever and chills Cardiovascular: Negative for chest pain Respiratory: Negative cough and shortness of breath Gastrointestinal: Negative for nausea, vomiting, abdominal pain.  Neurological: Negative for dizziness and headache Skin: Negative for rash and wound.    Past medical history reviewed  Medication reviewed. See [facility]   Medication List       This list is accurate as of: 01/05/15  3:36 PM.  Always use your most recent med list.               acetaminophen 500 MG tablet  Commonly known as:  TYLENOL  Take 500 mg by mouth every 4 (four) hours as needed for mild pain or fever.     atorvastatin 10 MG tablet  Commonly known as:  LIPITOR  Take 10 mg by mouth daily.     citalopram 20 MG tablet  Commonly known as:  CELEXA  Take 20 mg by mouth every morning.     HYDROcodone-acetaminophen 5-325 MG per tablet  Commonly known as:  NORCO/VICODIN  Take one tablet by mouth every 4 hours as needed for pain     latanoprost 0.005 % ophthalmic solution  Commonly known as:  XALATAN  Place 1 drop into both eyes at bedtime.     LORazepam 0.5 MG tablet  Commonly known as:  ATIVAN  Take 0.5 mg by mouth every 6 (six) hours as needed for anxiety.     metFORMIN 500 MG tablet  Commonly known as:  GLUCOPHAGE  Take 500 mg by mouth 2 (two) times daily  with a meal.     NAMENDA XR 28 MG Cp24 24 hr capsule  Generic drug:  memantine  Take 28 mg by mouth daily. For Dementia     tamsulosin 0.4 MG Caps capsule  Commonly known as:  FLOMAX  Take 0.4 mg by mouth daily.        Physical exam BP 138/76 mmHg  Pulse 88  Temp(Src) 97.3 F (36.3 C)  Resp 18  Wt 192 lb 6.4 oz (87.272 kg)  SpO2 98%  Wt Readings from Last 3 Encounters:  01/05/15 192 lb 6.4 oz (87.272 kg)  10/24/14 190 lb 3.2 oz (86.274 kg)  06/27/14 180 lb 3.2 oz (81.738 kg)   General- elderly male in no acute distress Head- atraumatic, normocephalic Eyes- PERRLA, EOMI, no pallor, no icterus Mouth- halitosis +, adentulous, normal oropharynx Neck- no lymphadenopathy Cardiovascular- normal s1,s2, no murmurs/, intact distal pulses Respiratory- bilateral clear to auscultation, no wheeze, no rhonchi, no crackles Abdomen- bowel sounds present, soft, non tender Musculoskeletal- able to move all 4 extremities, no leg edema,lower extremity weakness present, on wheelchair Neurological- alert and oriented to person Psychiatry- flat affect  Labs 08/03/14 wbc 6.3, hb 13.9, hct 42.6, plt 285, na 141, k 4.2, bun 11, cr 0.96, glu 77, ca 9.7, a1c 6.5, lft wnl 12/10/14 wbc 5.9, hb  15.2, hct 45.2, plt 291, na 139, k 4.1, glu 106, bun 10, cr 1.04, a1c 6.8   Assessment/plan  BPH No signs of obstruction. Stable, continue flomax 0.4 mg daily  Dementia Stable, decline anticipated, weight stable, no pressure sores, continue assistance with ADLs. Continue namenda xr 28 mg daily  Dm type 2 Reviewed a1c s/o controlled diabetes. Currently on metformin 500 mg bid. Goal a1c < 7. Decrease metformin to 500 mg daily. Continue cbg check. Normal urine microalbumin 05/30/14. Continue statin. Aspirin declined by family. uptodate with foot and eye exam   Blanchie Serve, MD  [facility] Adult Medicine [phone] (Monday-Friday 8 am - 5 pm) [phone] (afterhours)

## 2015-02-08 ENCOUNTER — Non-Acute Institutional Stay (SKILLED_NURSING_FACILITY): Payer: Medicare Other | Admitting: Internal Medicine

## 2015-02-08 ENCOUNTER — Encounter: Payer: Self-pay | Admitting: Internal Medicine

## 2015-02-08 DIAGNOSIS — G309 Alzheimer's disease, unspecified: Secondary | ICD-10-CM

## 2015-02-08 DIAGNOSIS — E785 Hyperlipidemia, unspecified: Secondary | ICD-10-CM | POA: Insufficient documentation

## 2015-02-08 DIAGNOSIS — F418 Other specified anxiety disorders: Secondary | ICD-10-CM

## 2015-02-08 DIAGNOSIS — F0280 Dementia in other diseases classified elsewhere without behavioral disturbance: Secondary | ICD-10-CM

## 2015-02-08 NOTE — Progress Notes (Signed)
Patient ID: Derrick Rubio, male   DOB: 1936/01/21, 79 y.o.   MRN: [id number]      Facility: [facility]  Chief complaint: medical management of chronic issues  Allergies: reviewed, NKDA  Code status:DNR  HPI 79 y.o. male patient is seen today for routine visit. He is pleasantly confused with his dementia, sitting on his bed. No new skin concerns. No new concern from staff. No falls reported. Continues to resist care at times. He has past medical history of vascular dementia, depression, DVT s/p IVC filter, DM2, HLD among others.   ROS: limited with dementia Constitutional: Negative for fever and chills Cardiovascular: Negative for chest pain Respiratory: Negative cough and shortness of breath Gastrointestinal: Negative for nausea, vomiting, abdominal pain.  Neurological: Negative for dizziness and headache Skin: Negative for rash and wound.    Past medical history reviewed  Medication reviewed. See [facility]   Medication List       This list is accurate as of: 02/08/15  1:04 PM.  Always use your most recent med list.               acetaminophen 500 MG tablet  Commonly known as:  TYLENOL  Take 500 mg by mouth every 4 (four) hours as needed for mild pain or fever.     atorvastatin 10 MG tablet  Commonly known as:  LIPITOR  Take one tablet by mouth once daily for cholesterol     citalopram 20 MG tablet  Commonly known as:  CELEXA  Take one tablet by mouth once daily for depression     HYDROcodone-acetaminophen 5-325 MG per tablet  Commonly known as:  NORCO/VICODIN  Take one tablet by mouth every 4 hours as needed for pain     latanoprost 0.005 % ophthalmic solution  Commonly known as:  XALATAN  Instill one drop in each eye every night at bedtime for glaucoma. Wait 3-5 minutes between 2 eye med     LORazepam 0.5 MG tablet  Commonly known as:  ATIVAN  Take 0.5 mg by mouth every 6 (six) hours as needed for anxiety.     metFORMIN 500 MG tablet  Commonly known as:  GLUCOPHAGE  Take one tablet by mouth once daily for diabetes     NAMENDA XR 28 MG Cp24 24 hr capsule  Generic drug:  memantine  Take one capsule by mouth once daily for dementia     tamsulosin 0.4 MG Caps capsule  Commonly known as:  FLOMAX  Take one capsule by mouth once daily for BPH        Physical exam BP 119/78 mmHg  Pulse 79  Temp(Src) 98.1 F (36.7 C) (Oral)  Resp 17  Ht 6' (1.829 m)  Wt 192 lb 3.2 oz (87.181 kg)  BMI 26.06 kg/m2  Wt Readings from Last 3 Encounters:  02/08/15 192 lb 3.2 oz (87.181 kg)  01/05/15 192 lb 6.4 oz (87.272 kg)  10/24/14 190 lb 3.2 oz (86.274 kg)   General- elderly male in no acute distress Head- atraumatic, normocephalic Eyes- PERRLA, EOMI, no pallor, no icterus Mouth- halitosis +, adentulous, normal oropharynx Neck- no lymphadenopathy Cardiovascular- normal s1,s2, no murmurs Respiratory- bilateral clear to auscultation, no wheeze, no rhonchi, no crackles Abdomen- bowel sounds present, soft, non tender Musculoskeletal- able to move all 4 extremities, no leg edema,lower extremity weakness present, on wheelchair Neurological- alert and oriented to person  Labs 08/03/14 wbc 6.3, hb 13.9, hct 42.6, plt 285, na 141,  k 4.2, bun 11, cr 0.96, glu 77, ca 9.7, a1c 6.5, lft wnl 12/10/14 wbc 5.9, hb 15.2, hct 45.2, plt 291, na 139, k 4.1, glu 106, bun 10, cr 1.04, a1c 6.8   Assessment/plan  Hyperlipidemia Has refused blood work in past. Currently on lipitor 10 mg daily, attempt for another lab for lipid panel  Depression and anxiety Related to his dementia. Continue celexa 20 mg daily with ativan 0.5 mg q6h prn anxiety  Dementia Stable, decline anticipated, weight stable, no pressure sores, continue assistance with ADLs. Continue namenda xr 28 mg daily    Blanchie Serve, MD  [facility] Adult Medicine [phone] (Monday-Friday 8 am - 5 pm) [phone] (afterhours)

## 2015-03-14 ENCOUNTER — Non-Acute Institutional Stay (SKILLED_NURSING_FACILITY): Payer: Medicare Other | Admitting: Internal Medicine

## 2015-03-14 DIAGNOSIS — F0393 Unspecified dementia, unspecified severity, with mood disturbance: Secondary | ICD-10-CM

## 2015-03-14 DIAGNOSIS — N390 Urinary tract infection, site not specified: Secondary | ICD-10-CM | POA: Diagnosis not present

## 2015-03-14 DIAGNOSIS — E785 Hyperlipidemia, unspecified: Secondary | ICD-10-CM | POA: Diagnosis not present

## 2015-03-14 DIAGNOSIS — F0280 Dementia in other diseases classified elsewhere without behavioral disturbance: Secondary | ICD-10-CM | POA: Diagnosis not present

## 2015-03-14 DIAGNOSIS — F329 Major depressive disorder, single episode, unspecified: Secondary | ICD-10-CM

## 2015-03-14 DIAGNOSIS — B964 Proteus (mirabilis) (morganii) as the cause of diseases classified elsewhere: Secondary | ICD-10-CM | POA: Diagnosis not present

## 2015-03-14 NOTE — Progress Notes (Signed)
Patient ID: Derrick Rubio, male   DOB: 27-Feb-1936, 79 y.o.   MRN: [id number]     Facility: [facility]  Chief complaint: medical management of chronic issues  Allergies: reviewed, NKDA  Code status:DNR  HPI 79 y.o. male patient is seen today for routine visit. He was recently treated for proteus UTI. He is now off lipitor with LDL at goal. He has been at his baseline. He is pleasantly confused with his dementia, sitting on his bed. No new skin concerns. No new concern from staff. No falls reported   ROS: limited with dementia Constitutional: Negative for fever and chills Cardiovascular: Negative for chest pain Respiratory: Negative cough and shortness of breath Gastrointestinal: Negative for nausea, vomiting, abdominal pain.  Neurological: Negative for dizziness and headache Skin: Negative for rash and wound.    Past medical history reviewed  Medication reviewed. See [facility]   Medication List       This list is accurate as of: 03/14/15  5:52 PM.  Always use your most recent med list.               acetaminophen 500 MG tablet  Commonly known as:  TYLENOL  Take 500 mg by mouth every 4 (four) hours as needed for mild pain or fever.     citalopram 20 MG tablet  Commonly known as:  CELEXA  Take one tablet by mouth once daily for depression     latanoprost 0.005 % ophthalmic solution  Commonly known as:  XALATAN  Instill one drop in each eye every night at bedtime for glaucoma. Wait 3-5 minutes between 2 eye med     metFORMIN 500 MG tablet  Commonly known as:  GLUCOPHAGE  Take one tablet by mouth once daily for diabetes     NAMENDA XR 28 MG Cp24 24 hr capsule  Generic drug:  memantine  Take one capsule by mouth once daily for dementia     sennosides-docusate sodium 8.6-50 MG tablet  Commonly known as:  SENOKOT-S  Take 1 tablet by mouth 2 (two) times daily.     tamsulosin 0.4 MG Caps capsule  Commonly known as:  FLOMAX    Take one capsule by mouth once daily for BPH        Physical exam BP 120/72 mmHg  Pulse 73  Temp(Src) 97 F (36.1 C)  Resp 17  Wt 200 lb 9.6 oz (90.992 kg)  Wt Readings from Last 3 Encounters:  03/14/15 200 lb 9.6 oz (90.992 kg)  02/08/15 192 lb 3.2 oz (87.181 kg)  01/05/15 192 lb 6.4 oz (87.272 kg)   General- elderly male in no acute distress Head- atraumatic, normocephalic Eyes- PERRLA, EOMI, no pallor, no icterus Mouth- adentulous, normal oropharynx Neck- no lymphadenopathy Cardiovascular- normal s1,s2, no murmurs Respiratory- bilateral clear to auscultation, no wheeze, no rhonchi, no crackles Abdomen- bowel sounds present, soft, non tender Musculoskeletal- able to move all 4 extremities, no leg edema,lower extremity weakness present, on wheelchair Neurological- alert and oriented to person  Labs 08/03/14 wbc 6.3, hb 13.9, hct 42.6, plt 285, na 141, k 4.2, bun 11, cr 0.96, glu 77, ca 9.7, a1c 6.5, lft wnl 12/10/14 wbc 5.9, hb 15.2, hct 45.2, plt 291, na 139, k 4.1, glu 106, bun 10, cr 1.04, a1c 6.8 02/08/15 proteus mirabilis UTI 02/09/15 t.chol 144, tg 97, hdl 38, ldl 87   Assessment/plan  Proteus UTI Completed his antibiotic course, hydration to be encouraged, monitor clinically  Hyperlipidemia ldl  at goal, off statin, monitor  Dementia with depression Continue namenda xr and celexa, monitor, to provide assistance with ADLs, fall precautions and pressure ulcer prophylaxis   Blanchie Serve, MD  [facility] Adult Medicine [phone] (Monday-Friday 8 am - 5 pm) [phone] (afterhours)

## 2015-06-13 LAB — HEPATIC FUNCTION PANEL
ALT: 12 U/L (ref 10–40)
AST: 17 U/L (ref 14–40)
Alkaline Phosphatase: 89 U/L (ref 25–125)
Bilirubin, Total: 0.5 mg/dL

## 2015-06-13 LAB — CBC AND DIFFERENTIAL
HCT: 40 % — AB (ref 41–53)
HEMOGLOBIN: 13.2 g/dL — AB (ref 13.5–17.5)
PLATELETS: 213 10*3/uL (ref 150–399)
WBC: 4.6 10*3/mL

## 2015-06-13 LAB — BASIC METABOLIC PANEL
BUN: 13 mg/dL (ref 4–21)
Creatinine: 1.2 mg/dL (ref 0.6–1.3)
GLUCOSE: 124 mg/dL
POTASSIUM: 4.2 mmol/L (ref 3.4–5.3)
Sodium: 141 mmol/L (ref 137–147)

## 2015-06-13 LAB — PSA: PSA: 0.45

## 2015-06-21 LAB — HM DIABETES FOOT EXAM

## 2015-07-12 LAB — HM DIABETES EYE EXAM

## 2015-07-14 ENCOUNTER — Non-Acute Institutional Stay (SKILLED_NURSING_FACILITY): Payer: Medicare Other | Admitting: Internal Medicine

## 2015-07-14 ENCOUNTER — Encounter: Payer: Self-pay | Admitting: Internal Medicine

## 2015-07-14 DIAGNOSIS — Z0000 Encounter for general adult medical examination without abnormal findings: Secondary | ICD-10-CM

## 2015-07-14 DIAGNOSIS — H409 Unspecified glaucoma: Secondary | ICD-10-CM

## 2015-07-14 DIAGNOSIS — K5909 Other constipation: Secondary | ICD-10-CM

## 2015-07-14 DIAGNOSIS — F329 Major depressive disorder, single episode, unspecified: Secondary | ICD-10-CM | POA: Diagnosis not present

## 2015-07-14 DIAGNOSIS — E785 Hyperlipidemia, unspecified: Secondary | ICD-10-CM

## 2015-07-14 DIAGNOSIS — K5900 Constipation, unspecified: Secondary | ICD-10-CM

## 2015-07-14 DIAGNOSIS — F0280 Dementia in other diseases classified elsewhere without behavioral disturbance: Secondary | ICD-10-CM | POA: Insufficient documentation

## 2015-07-14 DIAGNOSIS — G308 Other Alzheimer's disease: Secondary | ICD-10-CM

## 2015-07-14 DIAGNOSIS — N400 Enlarged prostate without lower urinary tract symptoms: Secondary | ICD-10-CM

## 2015-07-14 DIAGNOSIS — E1151 Type 2 diabetes mellitus with diabetic peripheral angiopathy without gangrene: Secondary | ICD-10-CM | POA: Diagnosis not present

## 2015-07-14 DIAGNOSIS — G309 Alzheimer's disease, unspecified: Secondary | ICD-10-CM

## 2015-07-14 DIAGNOSIS — F0393 Unspecified dementia, unspecified severity, with mood disturbance: Secondary | ICD-10-CM

## 2015-07-14 NOTE — Progress Notes (Signed)
Patient ID: Derrick Rubio, male   DOB: 10-07-1935, 80 y.o.   MRN: [id number]     Facility: [facility]  Chief Complaint  Patient presents with  . Annual Exam    Allergies: reviewed, NKDA  Code status:DNR  HPI 80 y.o. male patient is seen today for annual visit. He has PMH of DM with circulatory complications, BPH, alzheimer's dementia, OA, HLD among others. He has been at his baseline. No new skin concerns per staff. No falls reported. No acute behavior changes. Reviewed his PSA which is normal. His bowel movement is stable with hte medications. His weight has been stable, infact has gained weight. appetite has been good. He is sitting on his wheelchair.  He needs assistance with bathing, eating, transfers, toileting. He transfers with 2 person assist. He is uptodate with his influenza and pneumococcal vaccine. uptodate with his eye and foot exam.   ROS: limited with dementia Constitutional: Negative for fever Cardiovascular: Negative for chest pain Respiratory: Negative cough and shortness of breath Gastrointestinal: Negative for nausea, vomiting, abdominal pain.  Neurological: Negative for dizziness and headache Skin: Negative for rash and wound.    Past medical history reviewed  Medication reviewed. See [facility]   Medication List       This list is accurate as of: 07/14/15 11:18 AM.  Always use your most recent med list.               acetaminophen 500 MG tablet  Commonly known as:  TYLENOL  Take 500 mg by mouth every 4 (four) hours as needed for mild pain or fever.     citalopram 20 MG tablet  Commonly known as:  CELEXA  Take one tablet by mouth once daily for depression     latanoprost 0.005 % ophthalmic solution  Commonly known as:  XALATAN  Instill one drop in each eye every night at bedtime for glaucoma. Wait 3-5 minutes between 2 eye med     metFORMIN 500 MG tablet  Commonly known as:  GLUCOPHAGE  Take one tablet by  mouth once daily for diabetes     NAMENDA XR 28 MG Cp24 24 hr capsule  Generic drug:  memantine  Take one capsule by mouth once daily for dementia     sennosides-docusate sodium 8.6-50 MG tablet  Commonly known as:  SENOKOT-S  Take 1 tablet by mouth 2 (two) times daily.     tamsulosin 0.4 MG Caps capsule  Commonly known as:  FLOMAX  Take one capsule by mouth once daily for BPH        Physical exam BP 112/76 mmHg  Pulse 66  Temp(Src) 97.1 F (36.2 C) (Oral)  Resp 18  Ht 6' (1.829 m)  Wt 201 lb 12.8 oz (91.536 kg)  BMI 27.36 kg/m2  Wt Readings from Last 3 Encounters:  07/14/15 201 lb 12.8 oz (91.536 kg)  03/14/15 200 lb 9.6 oz (90.992 kg)  02/08/15 192 lb 3.2 oz (87.181 kg)   General- elderly male in no acute distress Head- atraumatic, normocephalic Eyes- PERRLA, EOMI, no pallor, no icterus Mouth- adentulous, normal oropharynx Neck- no lymphadenopathy Cardiovascular- normal s1,s2, no murmurs Respiratory- bilateral clear to auscultation, no wheeze, no rhonchi, no crackles Abdomen- bowel sounds present, soft, non tender Musculoskeletal- able to move all 4 extremities, no leg edema,lower extremity weakness present, on wheelchair Neurological- alert and oriented to person  Labs CBC Latest Ref Rng 06/13/2015 12/08/2014 08/03/2014  WBC - 4.6 5.9 -  Hemoglobin 13.5 - 17.5 g/dL 13.2(A) 15.2 13.9  Hematocrit 41 - 53 % 40(A) 45 43  Platelets 150 - 399 K/L 213 291 -   CMP Latest Ref Rng 06/13/2015 12/08/2014 08/03/2014  Glucose 70 - 99 mg/dL - - -  BUN 4 - 21 mg/dL 13 10 -  Creatinine 0.6 - 1.3 mg/dL 1.2 1.0 1.0  Sodium 137 - 147 mmol/L 141 - -  Potassium 3.4 - 5.3 mmol/L 4.2 4.1 -  Chloride 96 - 112 mEq/L - - -  CO2 19 - 32 mEq/L - - -  Calcium 8.4 - 10.5 mg/dL - - -  Total Protein 6.0 - 8.3 g/dL - - -  Total Bilirubin 0.3 - 1.2 mg/dL - - -  Alkaline Phos 25 - 125 U/L 89 78 -  AST 14 - 40 U/L 17 19 -  ALT 10 - 40 U/L 12 22 -   Lipid Panel     Component Value  Date/Time   CHOL 152 12/06/2013   TRIG 53 12/06/2013   LDLCALC 93 12/06/2013   Lab Results  Component Value Date   HGBA1C 6.8* 12/08/2014   06/13/15 PSA 0.46   Assessment/plan  Annual exam Reviewed labs including PSA. Will need hemeoccult test of stool. bp reading stable. Continue to provide assistance with his ADLs. Immunization reviewed.   alzhiemer's dementia Advanced, decline anticipated. Continue assistance with ADLs, fall precautions, pressure ulcer prophylaxis. Continue namenda xr  Depression with dementia Stable, continue celexa and attempt GDR to 10 mg daily  Glaucoma uptodate with eye exam, continue latanoprost  Dm type 2 Lab Results  Component Value Date   HGBA1C 6.8* 12/08/2014  monitor cbg. Continue metformin 500 mg daily, check a1c and if <7, will discontinue metformin  Constipation Stable with senokot s and maintain hydration  BPH Continue flomax  Hyperlipidemia ldl at goal, off statin, monitor and check lipid panel   Blanchie Serve, MD  [facility] Adult Medicine [phone] (Monday-Friday 8 am - 5 pm) [phone] (afterhours)

## 2015-07-17 LAB — LIPID PANEL
CHOLESTEROL: 222 mg/dL — AB (ref 0–200)
HDL: 36 mg/dL (ref 35–70)
LDL CALC: 160 mg/dL
TRIGLYCERIDES: 131 mg/dL (ref 40–160)

## 2015-07-17 LAB — HEMOGLOBIN A1C: Hemoglobin A1C: 7.3

## 2015-08-11 ENCOUNTER — Non-Acute Institutional Stay (SKILLED_NURSING_FACILITY): Payer: Medicare Other | Admitting: Internal Medicine

## 2015-08-11 ENCOUNTER — Encounter: Payer: Self-pay | Admitting: Internal Medicine

## 2015-08-11 DIAGNOSIS — F0280 Dementia in other diseases classified elsewhere without behavioral disturbance: Secondary | ICD-10-CM

## 2015-08-11 DIAGNOSIS — E1151 Type 2 diabetes mellitus with diabetic peripheral angiopathy without gangrene: Secondary | ICD-10-CM | POA: Diagnosis not present

## 2015-08-11 DIAGNOSIS — F329 Major depressive disorder, single episode, unspecified: Secondary | ICD-10-CM

## 2015-08-11 DIAGNOSIS — F0393 Unspecified dementia, unspecified severity, with mood disturbance: Secondary | ICD-10-CM

## 2015-08-11 DIAGNOSIS — E785 Hyperlipidemia, unspecified: Secondary | ICD-10-CM

## 2015-08-11 NOTE — Progress Notes (Signed)
Patient ID: Derrick Rubio, male   DOB: [birth year], 80 y.o.   MRN: [id number]   Location:  [facility] Room Number: 802- A Place of Service:  SNF (31)   Blanchie Serve, MD  Patient Care Team: Blanchie Serve, MD as PCP - General (Internal Medicine) Gerlene Fee, NP as Nurse Practitioner (Nurse Practitioner)  Extended Emergency Contact Information Primary Emergency Contact: Paulene Floor Address: [address]          [place], [place] Phone: [phone] Work Phone: [phone] Mobile Phone: [phone] Relation: Spouse Secondary Emergency Contact: Kesselman,William Address: [address]          [place], [place] [address] [place]te [place]itter of [place] Phone: [phone] Mobile Phone: [phone] Relation: Son  Code Status:  DNR  Goals of care: Advanced Directive information Advanced Directives 07/14/2015  Does patient have an advance directive? Yes  Type of Advance Directive Out of facility DNR (pink MOST or yellow form)  Does patient want to make changes to advanced directive? No - Patient declined  Copy of advanced directive(s) in chart? Yes     Chief Complaint  Patient presents with  . Medical Management of Chronic Issues    Routine Visit    No Known Allergies  HPI:  Pt is a 80 y.o. male seen today for medical management of chronic diseases. He has been at his baseline. No new skin concerns per staff. No falls reported. No acute behavior changes. He needs assistance with bathing, eating, transfers, toileting. He transfers with 2 person assist.   ROS: limited with dementia Constitutional: Negative for fever Cardiovascular: Negative for chest pain Respiratory: Negative cough and shortness of breath Gastrointestinal: Negative for nausea, vomiting, abdominal pain.  Neurological: Negative for dizziness and headache Skin: Negative for rash and wound.     Past Medical History  Diagnosis Date   . Depression   . Dementia   . Diabetes mellitus without complication ([place])   . Latent tuberculosis 09/16/2013  . DVT (deep venous thrombosis) ([place]) 09/16/2013  . 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 Pure hypercholesterolemia 10/18/2013  . Glaucoma   . Latent tuberculosis   . Hypercholesteremia   . Osteoarthritis 09/16/2013  . Cancer [facility])    Past Surgical History  Procedure Laterality Date  . Ivc filter placement    . Colonoscopy with propofol N/A 01/24/2014    Procedure: COLONOSCOPY WITH PROPOFOL;  Surgeon: Garlan Fair, MD;  Location: [facility];  Service: Endoscopy;  Laterality: N/A;       Medication List       This list is accurate as of: 08/11/15  4:47 PM.  Always use your most recent med list.               acetaminophen 500 MG tablet  Commonly known as:  TYLENOL  Take 500 mg by mouth every 4 (four) hours as needed for mild pain or fever.     atorvastatin 20 MG tablet  Commonly known as:  LIPITOR  Take 20 mg by mouth daily.     citalopram 10 MG tablet  Commonly known as:  CELEXA  Take 10 mg by mouth daily.     latanoprost 0.005 % ophthalmic solution  Commonly known as:  XALATAN  Instill one drop in each eye every night at bedtime for glaucoma. Wait 3-5 minutes between 2 eye med     metFORMIN 500 MG tablet  Commonly known as:  GLUCOPHAGE  Take one  tablet by mouth once daily for diabetes     NAMENDA XR 28 MG Cp24 24 hr capsule  Generic drug:  memantine  Take one capsule by mouth once daily for dementia     sennosides-docusate sodium 8.6-50 MG tablet  Commonly known as:  SENOKOT-S  Take 1 tablet by mouth 2 (two) times daily.     tamsulosin 0.4 MG Caps capsule  Commonly known as:  FLOMAX  Take one capsule by mouth once daily for BPH         Immunization History  Administered Date(s) Administered  . Influenza-Unspecified 03/29/2014, 04/14/2015  . PPD Test 12/15/2013, 02/21/2015  . Pneumococcal-Unspecified 04/06/2014   Pertinent  Health  Maintenance Due  Topic Date Due  . FOOT EXAM  08/03/2015  . PNA vac Low Risk Adult (2 of 2 - PCV13) 06/17/2016 (Originally 04/07/2015)  . HEMOGLOBIN A1C  01/14/2016  . INFLUENZA VACCINE  01/16/2016  . URINE MICROALBUMIN  06/15/2016  . OPHTHALMOLOGY EXAM  07/11/2016   Fall Risk  04/01/2013 03/08/2013  Falls in the past year? No No   Functional Status Survey:     Physical exam: Filed Vitals:   08/11/15 1533  BP: 124/78  Pulse: 66  Temp: 98.7 F (37.1 C)  TempSrc: Oral  Resp: 18  Height: 6' (1.829 m)  Weight: 204 lb 3.2 oz (92.625 kg)   Body mass index is 27.69 kg/(m^2).  General- elderly male in no acute distress Head- atraumatic, normocephalic Eyes- PERRLA, EOMI, no pallor, no icterus Mouth- adentulous, normal oropharynx Neck- no lymphadenopathy Cardiovascular- normal s1,s2, no murmurs Respiratory- bilateral clear to auscultation, no wheeze, no rhonchi, no crackles Abdomen- bowel sounds present, soft, non tender Musculoskeletal- able to move all 4 extremities, no leg edema,lower extremity weakness present, on wheelchair Neurological- alert and oriented to person  Labs reviewed:  Recent Labs  12/08/14 06/13/15  NA  --  141  K 4.1 4.2  BUN 10 13  CREATININE 1.0 1.2    Recent Labs  12/08/14 06/13/15  AST 19 17  ALT 22 12  ALKPHOS 78 89    Recent Labs  12/08/14 06/13/15  WBC 5.9 4.6  HGB 15.2 13.2*  HCT 45 40*  PLT 291 213   No results found for: TSH Lab Results  Component Value Date   HGBA1C 7.3 07/17/2015   Lab Results  Component Value Date   CHOL 222* 07/17/2015   HDL 36 07/17/2015   LDLCALC 160 07/17/2015   TRIG 131 07/17/2015     Assessment/Plan  Hyperlipidemia With elevated total cholesterol level, have patient take lipitor 20 mg daily and monitor  Depression with dementia Stable, has tolerated reduced dosing of celexa well. Monitor  Type 2 dm with circulatory disorder Lab Results  Component Value Date   HGBA1C 7.3 07/17/2015    continue metformin 500 mg daily. Continue statin. Monitor cbg. Check urine for microalbumin and consider ACEI if +    Blanchie Serve, MD Internal Medicine [facility] [address] [place], [place] [address] Cell Phone (Monday-Friday 8 am - 5 pm): [phone] On Call: [phone] and follow prompts after 5 pm and on weekends Office Phone: [phone] Office Fax: [phone]

## 2015-08-14 LAB — MICROALBUMIN, URINE: Microalb, Ur: 45.4

## 2015-08-23 ENCOUNTER — Emergency Department (HOSPITAL_COMMUNITY)
Admission: EM | Admit: 2015-08-23 | Discharge: 2015-08-23 | Disposition: A | Discharge status: Home / Self Care | Payer: PRIVATE HEALTH INSURANCE | Attending: Emergency Medicine | Admitting: Emergency Medicine

## 2015-08-23 ENCOUNTER — Encounter (HOSPITAL_COMMUNITY): Payer: Self-pay | Admitting: Emergency Medicine

## 2015-08-23 DIAGNOSIS — F0390 Unspecified dementia without behavioral disturbance: Secondary | ICD-10-CM | POA: Diagnosis not present

## 2015-08-23 DIAGNOSIS — Z86718 Personal history of other venous thrombosis and embolism: Secondary | ICD-10-CM | POA: Insufficient documentation

## 2015-08-23 DIAGNOSIS — E119 Type 2 diabetes mellitus without complications: Secondary | ICD-10-CM | POA: Diagnosis not present

## 2015-08-23 DIAGNOSIS — M1990 Unspecified osteoarthritis, unspecified site: Secondary | ICD-10-CM | POA: Insufficient documentation

## 2015-08-23 DIAGNOSIS — Z79899 Other long term (current) drug therapy: Secondary | ICD-10-CM | POA: Insufficient documentation

## 2015-08-23 DIAGNOSIS — E7800 Pure hypercholesterolemia, unspecified: Secondary | ICD-10-CM | POA: Insufficient documentation

## 2015-08-23 DIAGNOSIS — R531 Weakness: Secondary | ICD-10-CM | POA: Diagnosis present

## 2015-08-23 DIAGNOSIS — Z7984 Long term (current) use of oral hypoglycemic drugs: Secondary | ICD-10-CM | POA: Diagnosis not present

## 2015-08-23 DIAGNOSIS — Z859 Personal history of malignant neoplasm, unspecified: Secondary | ICD-10-CM | POA: Insufficient documentation

## 2015-08-23 DIAGNOSIS — H409 Unspecified glaucoma: Secondary | ICD-10-CM | POA: Diagnosis not present

## 2015-08-23 DIAGNOSIS — Z8611 Personal history of tuberculosis: Secondary | ICD-10-CM | POA: Diagnosis not present

## 2015-08-23 DIAGNOSIS — R319 Hematuria, unspecified: Secondary | ICD-10-CM | POA: Diagnosis not present

## 2015-08-23 DIAGNOSIS — F329 Major depressive disorder, single episode, unspecified: Secondary | ICD-10-CM | POA: Diagnosis not present

## 2015-08-23 LAB — URINALYSIS, ROUTINE W REFLEX MICROSCOPIC
BILIRUBIN URINE: NEGATIVE
GLUCOSE, UA: NEGATIVE mg/dL
KETONES UR: NEGATIVE mg/dL
NITRITE: NEGATIVE
PH: 8 (ref 5.0–8.0)
Protein, ur: 30 mg/dL — AB
SPECIFIC GRAVITY, URINE: 1.016 (ref 1.005–1.030)

## 2015-08-23 LAB — CBC
HCT: 41.5 % (ref 39.0–52.0)
Hemoglobin: 13.7 g/dL (ref 13.0–17.0)
MCH: 30.6 pg (ref 26.0–34.0)
MCHC: 33 g/dL (ref 30.0–36.0)
MCV: 92.8 fL (ref 78.0–100.0)
Platelets: 268 10*3/uL (ref 150–400)
RBC: 4.47 MIL/uL (ref 4.22–5.81)
RDW: 13.3 % (ref 11.5–15.5)
WBC: 6.4 10*3/uL (ref 4.0–10.5)

## 2015-08-23 LAB — BASIC METABOLIC PANEL WITH GFR
Anion gap: 9 (ref 5–15)
BUN: 14 mg/dL (ref 6–20)
CO2: 26 mmol/L (ref 22–32)
Calcium: 9.3 mg/dL (ref 8.9–10.3)
Chloride: 105 mmol/L (ref 101–111)
Creatinine, Ser: 1.24 mg/dL (ref 0.61–1.24)
GFR calc Af Amer: >60 mL/min
GFR calc non Af Amer: 54 mL/min — ABNORMAL LOW
Glucose, Bld: 164 mg/dL — ABNORMAL HIGH (ref 65–99)
Potassium: 4.2 mmol/L (ref 3.5–5.1)
Sodium: 140 mmol/L (ref 135–145)

## 2015-08-23 LAB — BASIC METABOLIC PANEL
BUN: 14 mg/dL (ref 4–21)
CREATININE: 1.2 mg/dL (ref 0.6–1.3)
GLUCOSE: 164 mg/dL
SODIUM: 140 mmol/L (ref 137–147)

## 2015-08-23 LAB — URINE MICROSCOPIC-ADD ON

## 2015-08-23 LAB — CBC AND DIFFERENTIAL: WBC: 6.4 10^3/mL

## 2015-08-23 LAB — CBG MONITORING, ED: Glucose-Capillary: 152 mg/dL — ABNORMAL HIGH (ref 65–99)

## 2015-08-23 NOTE — Discharge Instructions (Signed)
Hematuria, Adult  Hematuria is blood in your urine. It can be caused by a bladder infection, kidney infection, prostate infection, kidney stone, or cancer of your urinary tract. Infections can usually be treated with medicine, and a kidney stone usually will pass through your urine. If neither of these is the cause of your hematuria, further workup to find out the reason may be needed.  It is very important that you tell your health care provider about any blood you see in your urine, even if the blood stops without treatment or happens without causing pain. Blood in your urine that happens and then stops and then happens again can be a symptom of a very serious condition. Also, pain is not a symptom in the initial stages of many urinary cancers.  HOME CARE INSTRUCTIONS    Drink lots of fluid, 3-4 quarts a day. If you have been diagnosed with an infection, cranberry juice is especially recommended, in addition to large amounts of water.   Avoid caffeine, tea, and carbonated beverages because they tend to irritate the bladder.   Avoid alcohol because it may irritate the prostate.   Take all medicines as directed by your health care provider.   If you were prescribed an antibiotic medicine, finish it all even if you start to feel better.   If you have been diagnosed with a kidney stone, follow your health care provider's instructions regarding straining your urine to catch the stone.   Empty your bladder often. Avoid holding urine for long periods of time.   After a bowel movement, women should cleanse front to back. Use each tissue only once.   Empty your bladder before and after sexual intercourse if you are a male.  SEEK MEDICAL CARE IF:   You develop back pain.   You have a fever.   You have a feeling of sickness in your stomach (nausea) or vomiting.   Your symptoms are not better in 3 days. Return sooner if you are getting worse.  SEEK IMMEDIATE MEDICAL CARE IF:    You develop severe vomiting and  are unable to keep the medicine down.   You develop severe back or abdominal pain despite taking your medicines.   You begin passing a large amount of blood or clots in your urine.   You feel extremely weak or faint, or you pass out.  MAKE SURE YOU:    Understand these instructions.   Will watch your condition.   Will get help right away if you are not doing well or get worse.     This information is not intended to replace advice given to you by your health care provider. Make sure you discuss any questions you have with your health care provider.     Document Released: 06/03/2005 Document Revised: 06/24/2014 Document Reviewed: 02/01/2013  Elsevier Interactive Patient Education 2016 [employer]  Hematuria, Adult  Hematuria is blood in your urine. It can be caused by a bladder infection, kidney infection, prostate infection, kidney stone, or cancer of your urinary tract. Infections can usually be treated with medicine, and a kidney stone usually will pass through your urine. If neither of these is the cause of your hematuria, further workup to find out the reason may be needed.  It is very important that you tell your health care provider about any blood you see in your urine, even if the blood stops without treatment or happens without causing pain. Blood in your urine that happens and then stops   and then happens again can be a symptom of a very serious condition. Also, pain is not a symptom in the initial stages of many urinary cancers.  HOME CARE INSTRUCTIONS    Drink lots of fluid, 3-4 quarts a day. If you have been diagnosed with an infection, cranberry juice is especially recommended, in addition to large amounts of water.   Avoid caffeine, tea, and carbonated beverages because they tend to irritate the bladder.   Avoid alcohol because it may irritate the prostate.   Take all medicines as directed by your health care provider.   If you were prescribed an antibiotic medicine, finish it all even if  you start to feel better.   If you have been diagnosed with a kidney stone, follow your health care provider's instructions regarding straining your urine to catch the stone.   Empty your bladder often. Avoid holding urine for long periods of time.   After a bowel movement, women should cleanse front to back. Use each tissue only once.   Empty your bladder before and after sexual intercourse if you are a male.  SEEK MEDICAL CARE IF:   You develop back pain.   You have a fever.   You have a feeling of sickness in your stomach (nausea) or vomiting.   Your symptoms are not better in 3 days. Return sooner if you are getting worse.  SEEK IMMEDIATE MEDICAL CARE IF:    You develop severe vomiting and are unable to keep the medicine down.   You develop severe back or abdominal pain despite taking your medicines.   You begin passing a large amount of blood or clots in your urine.   You feel extremely weak or faint, or you pass out.  MAKE SURE YOU:    Understand these instructions.   Will watch your condition.   Will get help right away if you are not doing well or get worse.     This information is not intended to replace advice given to you by your health care provider. Make sure you discuss any questions you have with your health care provider.     Document Released: 06/03/2005 Document Revised: 06/24/2014 Document Reviewed: 02/01/2013  Elsevier Interactive Patient Education 2016 Elsevier Inc.

## 2015-08-23 NOTE — ED Notes (Signed)
Per EMS, from [facility], pt having penile bleeding x 2 weeks. Per EMS, two weeks prior the facility attempted to place a foley when the bleeding started and has not improved. EMS says facility noticed an increase in weakness over the last week also.

## 2015-08-23 NOTE — ED Notes (Signed)
Report called to facility, [facility], pt going back to room 802. [facility] notified, will arrive soon. Explained D/C papers to wife. Pt placed in hall bed to await transport at this time.

## 2015-08-23 NOTE — ED Notes (Signed)
Tech showed EKG to ED MD

## 2015-08-23 NOTE — ED Notes (Signed)
Bed: [id number] Expected date:  Expected time:  Means of arrival:  Comments: EMS/penile bleeding

## 2015-08-23 NOTE — ED Notes (Signed)
In and out cath performed without complication or difficulty, pt tolerated well. Urine appears blood tinged, minimal resistance from prostate during insertion.

## 2015-08-23 NOTE — ED Notes (Signed)
Per Delman Kitten, will obtain labs with IV

## 2015-08-27 NOTE — ED Provider Notes (Signed)
CSN: [id number]     Arrival date & time 08/23/15  [id number] History   First MD Initiated Contact with Patient 08/23/15 1036     Chief Complaint  Patient presents with  . Penis Injury  . Weakness     (Consider location/radiation/quality/duration/timing/severity/associated sxs/prior Treatment) HPI   80 year old male presenting for evaluation of hematuria. Apparently this is an ongoing for about 2 weeks. Patient has dementia and is very pleasant, but not a reliable historian. His wife is at bedside. She reports that he seems to be acting like his normal self. He has not had any specific genital/urinary complaints. Patient for evaluation today because they have noted persistent bleeding. Apparently this was first noted to have attempted to catheterize him for urinary specimen. Has not any blood thinners. No reported blood in stool or melena.  Past Medical History  Diagnosis Date  . Depression   . Dementia   . Diabetes mellitus without complication ([place])   . Latent tuberculosis 09/16/2013  . DVT (deep venous thrombosis) ([place]) 09/16/2013  . 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 Pure hypercholesterolemia 10/18/2013  . Glaucoma   . Latent tuberculosis   . Hypercholesteremia   . Osteoarthritis 09/16/2013  . Cancer [facility])    Past Surgical History  Procedure Laterality Date  . Ivc filter placement    . Colonoscopy with propofol N/A 01/24/2014    Procedure: COLONOSCOPY WITH PROPOFOL;  Surgeon: Garlan Fair, MD;  Location: [facility];  Service: Endoscopy;  Laterality: N/A;   History reviewed. No pertinent family history. Social History  Substance Use Topics  . Smoking status: Never Smoker   . Smokeless tobacco: Never Used  . Alcohol Use: No    Review of Systems  All systems reviewed and negative, other than as noted in HPI.   Allergies  Review of patient's allergies indicates no known allergies.  Home Medications   Prior to Admission medications   Medication Sig Start Date End Date  Taking? Authorizing Provider  acetaminophen (TYLENOL) 500 MG tablet Take 500 mg by mouth every 4 (four) hours as needed for mild pain or fever.    Yes Historical Provider, MD  atorvastatin (LIPITOR) 20 MG tablet Take 20 mg by mouth daily.   Yes Historical Provider, MD  citalopram (CELEXA) 10 MG tablet Take 10 mg by mouth daily.   Yes Historical Provider, MD  latanoprost (XALATAN) 0.005 % ophthalmic solution Instill one drop in each eye every night at bedtime for glaucoma. Wait 3-5 minutes between 2 eye med   Yes Historical Provider, MD  memantine (NAMENDA XR) 28 MG CP24 24 hr capsule Take 28 mg by mouth daily.    Yes Historical Provider, MD  metFORMIN (GLUCOPHAGE) 500 MG tablet Take 500 mg by mouth daily with breakfast.    Yes Historical Provider, MD  sennosides-docusate sodium (SENOKOT-S) 8.6-50 MG tablet Take 1 tablet by mouth 2 (two) times daily.   Yes Historical Provider, MD  tamsulosin (FLOMAX) 0.4 MG CAPS capsule Take 0.4 mg by mouth daily.    Yes Historical Provider, MD   BP 105/73 mmHg  Pulse 74  Temp(Src) 97.5 F (36.4 C) (Oral)  Resp 16  SpO2 98% Physical Exam  Constitutional: He appears well-developed and well-nourished. No distress.  HENT:  Head: Normocephalic and atraumatic.  Eyes: Conjunctivae are normal. Pupils are equal, round, and reactive to light. Right eye exhibits no discharge. Left eye exhibits no discharge.  Neck: Neck supple.  Cardiovascular: Normal rate, regular rhythm and normal heart sounds.  Exam reveals no gallop and no friction rub.   No murmur heard. Pulmonary/Chest: Effort normal and breath sounds normal. No respiratory distress.  Abdominal: Soft. He exhibits no distension. There is no tenderness.  Genitourinary:  Small amount of blood noted in patient's pull-up. No blood noted at the meatus. No concerning lesions noted. No external signs of trauma to the genitals or perineum.  Musculoskeletal: He exhibits no edema or tenderness.  Neurological: He is  alert.  Skin: Skin is warm and dry.  Psychiatric:  Pleasantly confused  Nursing note and vitals reviewed.   ED Course  Procedures (including critical care time) Labs Review Labs Reviewed  BASIC METABOLIC PANEL - Abnormal; Notable for the following:    Glucose, Bld 164 (*)    GFR calc non Af Amer 54 (*)    All other components within normal limits  URINALYSIS, ROUTINE W REFLEX MICROSCOPIC (NOT AT [facility]) - Abnormal; Notable for the following:    Color, Urine RED (*)    APPearance CLOUDY (*)    Hgb urine dipstick LARGE (*)    Protein, ur 30 (*)    Leukocytes, UA SMALL (*)    All other components within normal limits  URINE MICROSCOPIC-ADD ON - Abnormal; Notable for the following:    Squamous Epithelial / LPF 0-5 (*)    Bacteria, UA FEW (*)    All other components within normal limits  CBG MONITORING, ED - Abnormal; Notable for the following:    Glucose-Capillary 152 (*)    All other components within normal limits  CBC    Imaging Review No results found. I have personally reviewed and evaluated these images and lab results as part of my medical decision-making.   EKG Interpretation   Date/Time:  Wednesday August 23 2015 10:24:23 EST Ventricular Rate:  67 PR Interval:  154 QRS Duration: 70 QT Interval:  590 QTC Calculation: 623 R Axis:   -34 Text Interpretation:  Sinus rhythm Left axis deviation Low voltage,  precordial leads Borderline T wave abnormalities Confirmed by Wilson Singer  MD,  Bear Osten ([id number]) on 08/23/2015 11:03:44 AM      MDM   Final diagnoses:  Hematuria    80 year old male with hematuria. My suspicion of this is traumatic from recent catheterization to obtain a urine sample. He denies any pain. UA is not consistent withinfection. At this point do not plan any acute intervention. His hemoglobin is normal. He has not any blood thinners. This hematuria persists, he develops symptoms consistent with UTI or retention then he needs further evaluation.    Virgel Manifold, MD 08/27/15 2241

## 2015-08-28 ENCOUNTER — Telehealth: Payer: Self-pay | Admitting: Internal Medicine

## 2015-08-28 NOTE — Telephone Encounter (Signed)
Possible re-admission to facility  - This is a patient you were seeing at **[facility] fu is needed IF patient was re-admitted to facility upon discharge. Hospital discharge **08/23/15**

## 2015-09-07 ENCOUNTER — Non-Acute Institutional Stay (SKILLED_NURSING_FACILITY): Payer: Medicare Other | Admitting: Internal Medicine

## 2015-09-07 ENCOUNTER — Encounter: Payer: Self-pay | Admitting: Internal Medicine

## 2015-09-07 DIAGNOSIS — K5909 Other constipation: Secondary | ICD-10-CM | POA: Diagnosis not present

## 2015-09-07 DIAGNOSIS — R809 Proteinuria, unspecified: Secondary | ICD-10-CM | POA: Diagnosis not present

## 2015-09-07 DIAGNOSIS — E1129 Type 2 diabetes mellitus with other diabetic kidney complication: Secondary | ICD-10-CM | POA: Insufficient documentation

## 2015-09-07 DIAGNOSIS — N400 Enlarged prostate without lower urinary tract symptoms: Secondary | ICD-10-CM

## 2015-09-07 DIAGNOSIS — G309 Alzheimer's disease, unspecified: Secondary | ICD-10-CM | POA: Diagnosis not present

## 2015-09-07 DIAGNOSIS — H409 Unspecified glaucoma: Secondary | ICD-10-CM | POA: Diagnosis not present

## 2015-09-07 DIAGNOSIS — F0280 Dementia in other diseases classified elsewhere without behavioral disturbance: Secondary | ICD-10-CM

## 2015-09-07 NOTE — Progress Notes (Signed)
Patient ID: Derrick Rubio, male   DOB: 09/29/1935, 80 y.o.   MRN: [id number]   Location:  [place] Room Number: [id number] Place of Service:  SNF (31)   Bubba Camp, [facility], MD  Patient Care Team: Blanchie Serve, MD as PCP - General (Internal Medicine) Gerlene Fee, NP as Nurse Practitioner (Nurse Practitioner)  Extended Emergency Contact Information Primary Emergency Contact: Paulene Floor Address: [address]          [place], [place] of [place] Phone: [phone] Work Phone: [phone] Mobile Phone: [phone] Relation: Spouse Secondary Emergency Contact: Addair,William Address: [address]          [place], [place] [address] Johnnette Litter of [place] Phone: [phone] Mobile Phone: [phone] Relation: Son  Code Status:  DNR  Goals of care: Advanced Directive information Advanced Directives 07/14/2015  Does patient have an advance directive? Yes  Type of Advance Directive Out of facility DNR (pink MOST or yellow form)  Does patient want to make changes to advanced directive? No - Patient declined  Copy of advanced directive(s) in chart? Yes     Chief Complaint  Patient presents with  . Medical Management of Chronic Issues    Routine Visit    No Known Allergies  HPI:  Pt is a 80 y.o. male seen today for medical management of chronic diseases. He has been at his baseline. He was having hematuria which has now resolved. Wife has concerns about trauma caused during catheterization and has asked to avoid further catheterization. Patient denies any concern today. No new skin concerns per staff. No falls reported. No acute behavior changes. He needs assistance with bathing, eating, transfers, toileting. He transfers with 2 person assist. He has advanced dementia.   ROS: limited with dementia Constitutional: Negative for fever Cardiovascular: Negative for chest pain Respiratory: Negative cough and  shortness of breath Gastrointestinal: Negative for nausea, vomiting, abdominal pain.  Neurological: Negative for dizziness and headache Skin: Negative for rash and wound.     Past Medical History  Diagnosis Date  . Depression   . Dementia   . Diabetes mellitus without complication ([place])   . Latent tuberculosis 09/16/2013  . DVT (deep venous thrombosis) ([place]) 09/16/2013  . 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 Pure hypercholesterolemia 10/18/2013  . Glaucoma   . Latent tuberculosis   . Hypercholesteremia   . Osteoarthritis 09/16/2013  . Cancer [facility])    Past Surgical History  Procedure Laterality Date  . Ivc filter placement    . Colonoscopy with propofol N/A 01/24/2014    Procedure: COLONOSCOPY WITH PROPOFOL;  Surgeon: Garlan Fair, MD;  [facility];  Service: Endoscopy;  Laterality: N/A;       Medication List       This list is accurate as of: 09/07/15  2:25 PM.  Always use your most recent med list.               acetaminophen 500 MG tablet  Commonly known as:  TYLENOL  Take 500 mg by mouth every 4 (four) hours as needed for mild pain or fever.     atorvastatin 20 MG tablet  Commonly known as:  LIPITOR  Take 20 mg by mouth daily.     citalopram 10 MG tablet  Commonly known as:  CELEXA  Take 10 mg by mouth daily.     latanoprost 0.005 % ophthalmic solution  Commonly known as:  XALATAN  Instill one drop in each  eye every night at bedtime for glaucoma. Wait 3-5 minutes between 2 eye med     metFORMIN 500 MG tablet  Commonly known as:  GLUCOPHAGE  Take 500 mg by mouth daily with breakfast.     NAMENDA XR 28 MG Cp24 24 hr capsule  Generic drug:  memantine  Take 28 mg by mouth daily.     sennosides-docusate sodium 8.6-50 MG tablet  Commonly known as:  SENOKOT-S  Take 1 tablet by mouth 2 (two) times daily.     tamsulosin 0.4 MG Caps capsule  Commonly known as:  FLOMAX  Take 0.4 mg by mouth daily.         Immunization History  Administered  Date(s) Administered  . Influenza-Unspecified 03/29/2014, 04/14/2015  . PPD Test 12/15/2013, 02/21/2015  . Pneumococcal-Unspecified 04/06/2014   Pertinent  Health Maintenance Due  Topic Date Due  . PNA vac Low Risk Adult (2 of 2 - PCV13) 06/17/2016 (Originally 04/07/2015)  . HEMOGLOBIN A1C  01/14/2016  . INFLUENZA VACCINE  01/16/2016  . URINE MICROALBUMIN  06/15/2016  . FOOT EXAM  06/20/2016  . OPHTHALMOLOGY EXAM  07/11/2016     Physical exam: Filed Vitals:   09/07/15 1407  BP: 116/70  Pulse: 62  Temp: 97.2 F (36.2 C)  TempSrc: Oral  Resp: 18  Height: 6' (1.829 m)  Weight: 207 lb 12.8 oz (94.257 kg)   Body mass index is 28.18 kg/(m^2).  General- elderly male in no acute distress Head- atraumatic, normocephalic Eyes- PERRLA, EOMI, no pallor, no icterus Mouth- adentulous, normal oropharynx Neck- no lymphadenopathy Cardiovascular- normal s1,s2, no murmurs Respiratory- bilateral clear to auscultation, no wheeze, no rhonchi, no crackles Abdomen- bowel sounds present, soft, non tender Musculoskeletal- able to move all 4 extremities, no leg edema,lower extremity weakness present, on wheelchair Neurological- alert and oriented to person  Labs reviewed:  Recent Labs  12/08/14 06/13/15 08/23/15 08/23/15 1008  NA  --  141 140 140  K 4.1 4.2  --  4.2  CL  --   --   --  105  CO2  --   --   --  26  GLUCOSE  --   --   --  164*  BUN 10 13 14 14   CREATININE 1.0 1.2 1.2 1.24  CALCIUM  --   --   --  9.3    Recent Labs  12/08/14 06/13/15  AST 19 17  ALT 22 12  ALKPHOS 78 89    Recent Labs  12/08/14 06/13/15 08/23/15 08/23/15 1008  WBC 5.9 4.6 6.4 6.4  HGB 15.2 13.2*  --  13.7  HCT 45 40*  --  41.5  MCV  --   --   --  92.8  PLT 291 213  --  268   No results found for: TSH Lab Results  Component Value Date   HGBA1C 7.3 07/17/2015   Lab Results  Component Value Date   CHOL 222* 07/17/2015   HDL 36 07/17/2015   LDLCALC 160 07/17/2015   TRIG 131 07/17/2015      Assessment/Plan  Glaucoma Stable, continue latanoprost  BPH Stable, no further hematuria, continue flomax  Constipation Continue senokot S bid  Dementia Continue namenda xr, assistance with her ADLs, fall precautions and pressure ulcer prophylaxis.   Microalbuminuria Noted in urine test. Has hx of diabetes. Start lisinopril 5 mg daily for renal protection. Check bp daily x 1 week with new bp med to help avoid hypotension   Blanchie Serve, MD Internal  [facility] [place], [place] [address] Cell Phone (Monday-Friday 8 am - 5 pm): [phone] On Call: [phone] and follow prompts after 5 pm and on weekends Office Phone: [phone] Office Fax: [phone]

## 2015-09-18 LAB — BASIC METABOLIC PANEL
BUN: 17 mg/dL (ref 4–21)
CREATININE: 1.2 mg/dL (ref 0.6–1.3)
Glucose: 179 mg/dL
POTASSIUM: 4.4 mmol/L (ref 3.4–5.3)
Sodium: 140 mmol/L (ref 137–147)

## 2015-09-18 LAB — CBC AND DIFFERENTIAL
HCT: 40 % — AB (ref 41–53)
HEMOGLOBIN: 12.9 g/dL — AB (ref 13.5–17.5)
PLATELETS: 233 10*3/uL (ref 150–399)
WBC: 5.1 10^3/mL

## 2015-09-18 LAB — HEMOGLOBIN A1C: HEMOGLOBIN A1C: 7.8

## 2015-09-25 LAB — BASIC METABOLIC PANEL
BUN: 13 mg/dL (ref 4–21)
Creatinine: 1.3 mg/dL (ref 0.6–1.3)
GLUCOSE: 208 mg/dL
POTASSIUM: 4.4 mmol/L (ref 3.4–5.3)
Sodium: 142 mmol/L (ref 137–147)

## 2015-09-25 LAB — HEPATIC FUNCTION PANEL
ALT: 22 U/L (ref 10–40)
AST: 20 U/L (ref 14–40)
Alkaline Phosphatase: 90 U/L (ref 25–125)
Bilirubin, Total: 0.7 mg/dL

## 2015-09-25 LAB — LIPID PANEL
Cholesterol: 171 mg/dL (ref 0–200)
HDL: 33 mg/dL — AB (ref 35–70)
LDL CALC: 105 mg/dL
Triglycerides: 168 mg/dL — AB (ref 40–160)

## 2015-10-02 LAB — HM DIABETES FOOT EXAM

## 2015-10-11 ENCOUNTER — Encounter: Payer: Self-pay | Admitting: Internal Medicine

## 2015-10-11 ENCOUNTER — Non-Acute Institutional Stay (SKILLED_NURSING_FACILITY): Payer: Medicare Other | Admitting: Internal Medicine

## 2015-10-11 DIAGNOSIS — E785 Hyperlipidemia, unspecified: Secondary | ICD-10-CM | POA: Diagnosis not present

## 2015-10-11 DIAGNOSIS — T148 Other injury of unspecified body region: Secondary | ICD-10-CM

## 2015-10-11 DIAGNOSIS — E119 Type 2 diabetes mellitus without complications: Secondary | ICD-10-CM

## 2015-10-11 DIAGNOSIS — E1121 Type 2 diabetes mellitus with diabetic nephropathy: Secondary | ICD-10-CM

## 2015-10-11 DIAGNOSIS — IMO0001 Reserved for inherently not codable concepts without codable children: Secondary | ICD-10-CM | POA: Insufficient documentation

## 2015-10-11 DIAGNOSIS — Z794 Long term (current) use of insulin: Secondary | ICD-10-CM

## 2015-10-11 DIAGNOSIS — T148XXA Other injury of unspecified body region, initial encounter: Secondary | ICD-10-CM

## 2015-10-11 NOTE — Progress Notes (Signed)
Patient ID: Derrick Rubio, male   DOB: 05-27-1936, 80 y.o.   MRN: [id number]   Location:  [place] Room Number: [id number] Place of Service:  SNF (31)   Bubba Camp, [facility], MD  Patient Care Team: Blanchie Serve, MD as PCP - General (Internal Medicine) Gerlene Fee, NP as Nurse Practitioner (Nurse Practitioner)  Extended Emergency Contact Information Primary Emergency Contact: Paulene Floor Address: [place]          [place], [place] of [place] Phone: [phone] Work Phone: [phone] Mobile Phone: [phone] Relation: Spouse Secondary Emergency Contact: Knights,William Address: [address]          [place], [place] [address] Johnnette Litter of [place] Phone: [phone] Mobile Phone: [phone] Relation: Son  Code Status:  DNR  Goals of care: Advanced Directive information Advanced Directives 07/14/2015  Does patient have an advance directive? Yes  Type of Advance Directive Out of facility DNR (pink MOST or yellow form)  Does patient want to make changes to advanced directive? No - Patient declined  Copy of advanced directive(s) in chart? Yes     Chief Complaint  Patient presents with  . Medical Management of Chronic Issues    Routine Visit    No Known Allergies  HPI:  Pt is a 80 y.o. male seen today for medical management of chronic diseases. He has a scrotal skin tear and is getting skin care for this. He is tolerating lisinopril well. No further hematuria per staff. No fall reported.   ROS: limited with dementia Constitutional: Negative for fever Cardiovascular: Negative for chest pain Respiratory: Negative cough and shortness of breath Gastrointestinal: Negative for nausea, vomiting, abdominal pain.  Neurological: Negative for dizziness and headache Skin: Negative for rash and wound.     Past Medical History  Diagnosis Date  . Depression   . Dementia   . Diabetes mellitus without  complication ([place])   . Latent tuberculosis 09/16/2013  . DVT (deep venous thrombosis) ([place]) 09/16/2013  . 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 Pure hypercholesterolemia 10/18/2013  . Glaucoma   . Latent tuberculosis   . Hypercholesteremia   . Osteoarthritis 09/16/2013  . Cancer [facility])    Past Surgical History  Procedure Laterality Date  . Ivc filter placement    . Colonoscopy with propofol N/A 01/24/2014    Procedure: COLONOSCOPY WITH PROPOFOL;  Surgeon: Garlan Fair, MD;  [facility];  Service: Endoscopy;  Laterality: N/A;       Medication List       This list is accurate as of: 10/11/15 12:46 PM.  Always use your most recent med list.               acetaminophen 500 MG tablet  Commonly known as:  TYLENOL  Take 500 mg by mouth every 4 (four) hours as needed for mild pain or fever.     atorvastatin 20 MG tablet  Commonly known as:  LIPITOR  Take 20 mg by mouth daily.     citalopram 10 MG tablet  Commonly known as:  CELEXA  Take 10 mg by mouth daily.     latanoprost 0.005 % ophthalmic solution  Commonly known as:  XALATAN  Instill one drop in each eye every night at bedtime for glaucoma. Wait 3-5 minutes between 2 eye med     lisinopril 5 MG tablet  Commonly known as:  PRINIVIL,ZESTRIL  Take 5 mg by mouth daily.     metFORMIN  500 MG tablet  Commonly known as:  GLUCOPHAGE  Take 500 mg by mouth daily with breakfast.     NAMENDA XR 28 MG Cp24 24 hr capsule  Generic drug:  memantine  Take 28 mg by mouth daily.     sennosides-docusate sodium 8.6-50 MG tablet  Commonly known as:  SENOKOT-S  Take 1 tablet by mouth 2 (two) times daily.     tamsulosin 0.4 MG Caps capsule  Commonly known as:  FLOMAX  Take 0.4 mg by mouth daily.     zinc oxide 11.3 % Crea cream  Commonly known as:  BALMEX  Apply 1 application topically 2 (two) times daily. Apply scrotum         Immunization History  Administered Date(s) Administered  . Influenza-Unspecified  03/29/2014, 04/14/2015  . PPD Test 12/15/2013, 02/21/2015  . Pneumococcal-Unspecified 04/06/2014   Pertinent  Health Maintenance Due  Topic Date Due  . PNA vac Low Risk Adult (2 of 2 - PCV13) 06/17/2016 (Originally 04/07/2015)  . HEMOGLOBIN A1C  01/14/2016  . INFLUENZA VACCINE  01/16/2016  . FOOT EXAM  06/20/2016  . OPHTHALMOLOGY EXAM  07/11/2016  . URINE MICROALBUMIN  08/13/2016     Physical exam: Filed Vitals:   10/11/15 1235  BP: 102/67  Pulse: 73  Temp: 97.1 F (36.2 C)  TempSrc: Oral  Resp: 18  Height: 6' (1.829 m)  Weight: 210 lb 3.2 oz (95.346 kg)   Body mass index is 28.5 kg/(m^2).  General- elderly male in no acute distress Head- atraumatic, normocephalic Eyes- PERRLA, EOMI, no pallor, no icterus Mouth- adentulous Neck- no lymphadenopathy Cardiovascular- normal s1,s2, no murmur Respiratory- bilateral clear to auscultation, no wheeze, no rhonchi, no crackles Abdomen- bowel sounds present, soft, non tender Musculoskeletal- able to move all 4 extremities, no leg edema,lower extremity weakness present, on wheelchair Neurological- alert and oriented to person only  Labs reviewed:  Recent Labs  08/23/15 1008 09/18/15 09/25/15  NA 140 140 142  K 4.2 4.4 4.4  CL 105  --   --   CO2 26  --   --   GLUCOSE 164*  --   --   BUN 14 17 13   CREATININE 1.24 1.2 1.3  CALCIUM 9.3  --   --     Recent Labs  12/08/14 06/13/15 09/25/15  AST 19 17 20   ALT 22 12 22   ALKPHOS 78 89 90    Recent Labs  06/13/15 08/23/15 08/23/15 1008 09/18/15  WBC 4.6 6.4 6.4 5.1  HGB 13.2*  --  13.7 12.9*  HCT 40*  --  41.5 40*  MCV  --   --  92.8  --   PLT 213  --  268 233   No results found for: TSH Lab Results  Component Value Date   HGBA1C 7.8 09/18/2015   Lab Results  Component Value Date   CHOL 171 09/25/2015   HDL 33* 09/25/2015   LDLCALC 105 09/25/2015   TRIG 168* 09/25/2015     Assessment/Plan  Hyperlipidemia With elevated TG level. Continue lipitor 20 mg  daily with dose adjusted in 2/17. Monitor clinically with recheck in 3 months  Diabetes mellitus with nephropathy Lab Results  Component Value Date   HGBA1C 7.8 09/18/2015  a1c went up from 7.3 to 7.8. monitor cbg continue metformin 500 mg daily and monitor cbg. Continue lisinopril but with his soft BP reading, decrease this to 2.5 mg daily and monitor. Check bp 3 times a week x 1 month to  assess further. Continue statin  Scrotal excoriation Continue balmex for skin care. Continue perineal care   Blanchie Serve, MD Internal Medicine [facility] [place], [place] [address] Cell Phone (Monday-Friday 8 am - 5 pm): [phone] On Call: [phone] and follow prompts after 5 pm and on weekends Office Phone: [phone] Office Fax: [phone]

## 2015-11-09 ENCOUNTER — Non-Acute Institutional Stay (SKILLED_NURSING_FACILITY): Payer: Medicare Other | Admitting: Internal Medicine

## 2015-11-09 ENCOUNTER — Encounter: Payer: Self-pay | Admitting: Internal Medicine

## 2015-11-09 DIAGNOSIS — F0280 Dementia in other diseases classified elsewhere without behavioral disturbance: Secondary | ICD-10-CM

## 2015-11-09 DIAGNOSIS — K5900 Constipation, unspecified: Secondary | ICD-10-CM

## 2015-11-09 DIAGNOSIS — G308 Other Alzheimer's disease: Secondary | ICD-10-CM

## 2015-11-09 DIAGNOSIS — K5909 Other constipation: Secondary | ICD-10-CM | POA: Insufficient documentation

## 2015-11-09 DIAGNOSIS — N400 Enlarged prostate without lower urinary tract symptoms: Secondary | ICD-10-CM | POA: Diagnosis not present

## 2015-11-09 NOTE — Progress Notes (Signed)
Patient ID: Derrick Rubio, male   DOB: [birth year], 80 y.o.   MRN: [id number]   Location:  [place] Room Number: [id number] Place of Service:  SNF (31)   Bubba Camp, [facility], MD  Patient Care Team: Blanchie Serve, MD as PCP - General (Internal Medicine)  Extended Emergency Contact Information Primary Emergency Contact: Bensch,Sandra Address: [place]          [place], [place] of [place] Phone: [phone] Work Phone: [phone] Mobile Phone: [phone] Relation: Spouse Secondary Emergency Contact: Buzzell,William Address: [address]          [place], [place] [address] Johnnette Litter of [place] Phone: [phone] Mobile Phone: [phone] Relation: Son  Code Status:  DNR  Goals of care: Advanced Directive information Advanced Directives 07/14/2015  Does patient have an advance directive? Yes  Type of Advance Directive Out of facility DNR (pink MOST or yellow form)  Does patient want to make changes to advanced directive? No - Patient declined  Copy of advanced directive(s) in chart? Yes     Chief Complaint  Patient presents with  . Medical Management of Chronic Issues    Routine Visit    No Known Allergies  HPI:  Pt is a 80 y.o. male seen today for medical management of chronic diseases. His wife is present at bedside. He denies any concerns. No new concern from nursing staff or wife. No fall reported.   ROS: limited with dementia Constitutional: Negative for fever Cardiovascular: Negative for chest pain Respiratory: Negative cough and shortness of breath Gastrointestinal: Negative for nausea, vomiting, abdominal pain.  Neurological: Negative for dizziness and headache Skin: Negative for rash and itching.     Past Medical History  Diagnosis Date  . Depression   . Dementia   . Diabetes mellitus without complication ([place])   . Latent tuberculosis 09/16/2013  . DVT (deep venous thrombosis) ([place])  09/16/2013  . 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 Pure hypercholesterolemia 10/18/2013  . Glaucoma   . Latent tuberculosis   . Hypercholesteremia   . Osteoarthritis 09/16/2013  . Cancer [facility])    Past Surgical History  Procedure Laterality Date  . Ivc filter placement    . Colonoscopy with propofol N/A 01/24/2014    Procedure: COLONOSCOPY WITH PROPOFOL;  Surgeon: Garlan Fair, MD;  [facility];  Service: Endoscopy;  Laterality: N/A;       Medication List       This list is accurate as of: 11/09/15 10:58 AM.  Always use your most recent med list.               acetaminophen 500 MG tablet  Commonly known as:  TYLENOL  Take 500 mg by mouth every 4 (four) hours as needed for mild pain or fever.     atorvastatin 20 MG tablet  Commonly known as:  LIPITOR  Take 20 mg by mouth daily.     citalopram 10 MG tablet  Commonly known as:  CELEXA  Take 10 mg by mouth daily.     latanoprost 0.005 % ophthalmic solution  Commonly known as:  XALATAN  Instill one drop in each eye every night at bedtime for glaucoma. Wait 3-5 minutes between 2 eye med     lisinopril 2.5 MG tablet  Commonly known as:  PRINIVIL,ZESTRIL  Take 2.5 mg by mouth daily.     metFORMIN 500 MG tablet  Commonly known as:  GLUCOPHAGE  Take 500 mg by  mouth daily with breakfast.     NAMENDA XR 28 MG Cp24 24 hr capsule  Generic drug:  memantine  Take 28 mg by mouth daily.     sennosides-docusate sodium 8.6-50 MG tablet  Commonly known as:  SENOKOT-S  Take 1 tablet by mouth 2 (two) times daily.     tamsulosin 0.4 MG Caps capsule  Commonly known as:  FLOMAX  Take 0.4 mg by mouth daily.     zinc oxide 11.3 % Crea cream  Commonly known as:  BALMEX  Apply 1 application topically 2 (two) times daily. Apply scrotum         Immunization History  Administered Date(s) Administered  . Influenza-Unspecified 03/29/2014, 04/14/2015  . PPD Test 12/15/2013, 02/21/2015  . Pneumococcal-Unspecified  04/06/2014     Physical exam: Filed Vitals:   11/09/15 1017  BP: 134/78  Pulse: 68  Temp: 97.1 F (36.2 C)  TempSrc: Oral  Resp: 16  Height: 6' (1.829 m)  Weight: 210 lb 9.6 oz (95.528 kg)  SpO2: 98%   Body mass index is 28.56 kg/(m^2).  General- elderly male in no acute distress Head- atraumatic, normocephalic Eyes- PERRLA, EOMI, no pallor, no icterus Mouth- adentulous, moist mucus membrane Neck- no lymphadenopathy Cardiovascular- normal s1,s2, no murmur Respiratory- bilateral clear to auscultation, no wheeze, no rhonchi, no crackles Abdomen- bowel sounds present, soft, non tender Musculoskeletal- able to move all 4 extremities, no leg edema,lower extremity weakness present, on wheelchair Neurological- alert and oriented to person only   Labs reviewed:  Recent Labs  08/23/15 1008 09/18/15 09/25/15  NA 140 140 142  K 4.2 4.4 4.4  CL 105  --   --   CO2 26  --   --   GLUCOSE 164*  --   --   BUN 14 17 13   CREATININE 1.24 1.2 1.3  CALCIUM 9.3  --   --     Recent Labs  12/08/14 06/13/15 09/25/15  AST 19 17 20   ALT 22 12 22   ALKPHOS 78 89 90    Recent Labs  06/13/15 08/23/15 08/23/15 1008 09/18/15  WBC 4.6 6.4 6.4 5.1  HGB 13.2*  --  13.7 12.9*  HCT 40*  --  41.5 40*  MCV  --   --  92.8  --   PLT 213  --  268 233   No results found for: TSH Lab Results  Component Value Date   HGBA1C 7.8 09/18/2015   Lab Results  Component Value Date   CHOL 171 09/25/2015   HDL 33* 09/25/2015   LDLCALC 105 09/25/2015   TRIG 168* 09/25/2015     Assessment/Plan  BPH Stable, continue flomax 0.4 mg daily and monitor clinically  Alzheimer's dementia Stable, decline anticipated. Continue namenda xr. Continue assistance with ADLs. Monitor po intake.   Constipation Stable, continue senokot s, hydration to be maintained    Blanchie Serve, MD Internal Medicine [facility], [place] [address] Cell Phone  (Monday-Friday 8 am - 5 pm): [phone] On Call: [phone] and follow prompts after 5 pm and on weekends Office Phone: [phone] Office Fax: [phone]

## 2015-12-08 ENCOUNTER — Encounter: Payer: Self-pay | Admitting: Internal Medicine

## 2015-12-08 ENCOUNTER — Non-Acute Institutional Stay (SKILLED_NURSING_FACILITY): Payer: Medicare Other | Admitting: Internal Medicine

## 2015-12-08 DIAGNOSIS — N509 Disorder of male genital organs, unspecified: Secondary | ICD-10-CM

## 2015-12-08 DIAGNOSIS — F329 Major depressive disorder, single episode, unspecified: Secondary | ICD-10-CM

## 2015-12-08 DIAGNOSIS — E785 Hyperlipidemia, unspecified: Secondary | ICD-10-CM | POA: Diagnosis not present

## 2015-12-08 DIAGNOSIS — E1121 Type 2 diabetes mellitus with diabetic nephropathy: Secondary | ICD-10-CM

## 2015-12-08 DIAGNOSIS — H409 Unspecified glaucoma: Secondary | ICD-10-CM

## 2015-12-08 NOTE — Progress Notes (Signed)
Patient ID: Derrick Rubio, male   DOB: 1935/10/11, 80 y.o.   MRN: [id number]   Location:    Nursing Home Room Number: [id number] Place of Service:  SNF (31)   Blanchie Serve, MD  Patient Care Team: Blanchie Serve, MD as PCP - General (Internal Medicine)  Extended Emergency Contact Information Primary Emergency Contact: Padron,Sandra Address: [address]          [place], [place] of [place] Phone: [phone] Work Phone: [phone] Mobile Phone: [phone] Relation: Spouse Secondary Emergency Contact: Kinnaird,William Address: [address]          [place], [place] [address] Johnnette Litter of [place] Phone: [phone] Mobile Phone: [phone] Relation: Son  Code Status:  DNR  Goals of care: Advanced Directive information Advanced Directives 12/08/2015  Does patient have an advance directive? Yes  Type of Advance Directive Out of facility DNR (pink MOST or yellow form)  Does patient want to make changes to advanced directive? No - Patient declined  Copy of advanced directive(s) in chart? Yes     Chief Complaint  Patient presents with  . Medical Management of Chronic Issues    Routine Visit    No Known Allergies  HPI:  Pt is a 80 y.o. male seen today for medical management of chronic diseases. No new concern from patient and nursing staff. No fall reported. No acute behavior concern. Has opening to skin area in the scrotum. This has healed before and now has reopened. He is mostly up in his wheelchair.   ROS: limited with dementia Constitutional: Negative for fever Cardiovascular: Negative for chest pain Respiratory: Negative cough and shortness of breath Gastrointestinal: Negative for nausea, vomiting, abdominal pain.  Neurological: Negative for dizziness and headache Skin: Negative for rash and itching.     Past Medical History  Diagnosis Date  . Depression   . Dementia   . Diabetes mellitus without complication ([place])   .  Latent tuberculosis 09/16/2013  . DVT (deep venous thrombosis) ([place]) 09/16/2013  . 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 Pure hypercholesterolemia 10/18/2013  . Glaucoma   . Latent tuberculosis   . Hypercholesteremia   . Osteoarthritis 09/16/2013  . Cancer [facility]    Past Surgical History  Procedure Laterality Date  . Ivc filter placement    . Colonoscopy with propofol N/A 01/24/2014    Procedure: COLONOSCOPY WITH PROPOFOL;  Surgeon: Garlan Fair, MD;  Location: [facility];  Service: Endoscopy;  Laterality: N/A;       Medication List       This list is accurate as of: 12/08/15 12:35 PM.  Always use your most recent med list.               acetaminophen 500 MG tablet  Commonly known as:  TYLENOL  Take 500 mg by mouth every 4 (four) hours as needed for mild pain or fever.     atorvastatin 20 MG tablet  Commonly known as:  LIPITOR  Take 20 mg by mouth daily.     citalopram 10 MG tablet  Commonly known as:  CELEXA  Take 10 mg by mouth daily.     latanoprost 0.005 % ophthalmic solution  Commonly known as:  XALATAN  Instill one drop in each eye every night at bedtime for glaucoma. Wait 3-5 minutes between 2 eye med     lisinopril 2.5 MG tablet  Commonly known as:  PRINIVIL,ZESTRIL  Take 2.5 mg by mouth daily.     metFORMIN  500 MG tablet  Commonly known as:  GLUCOPHAGE  Take 500 mg by mouth daily with breakfast.     NAMENDA XR 28 MG Cp24 24 hr capsule  Generic drug:  memantine  Take 28 mg by mouth daily.     sennosides-docusate sodium 8.6-50 MG tablet  Commonly known as:  SENOKOT-S  Take 1 tablet by mouth 2 (two) times daily.     tamsulosin 0.4 MG Caps capsule  Commonly known as:  FLOMAX  Take 0.4 mg by mouth daily.     zinc oxide 11.3 % Crea cream  Commonly known as:  BALMEX  Apply 1 application topically 2 (two) times daily. Apply scrotum         Immunization History  Administered Date(s) Administered  . Influenza-Unspecified 03/29/2014, 04/14/2015  .  PPD Test 12/15/2013, 02/21/2015  . Pneumococcal-Unspecified 04/06/2014     Physical exam: Filed Vitals:   12/08/15 1229  BP: 121/83  Pulse: 68  Temp: 97.1 F (36.2 C)  TempSrc: Oral  Resp: 18  Height: 6' (1.829 m)  Weight: 208 lb 6.4 oz (94.53 kg)   Body mass index is 28.26 kg/(m^2).  General- elderly male in no acute distress Head- atraumatic, normocephalic Eyes- no pallor, no icterus Mouth- adentulous, moist mucus membrane Neck- no lymphadenopathy Cardiovascular- normal s1,s2, no murmur Respiratory- bilateral clear to auscultation, no wheeze, no rhonchi, no crackles Abdomen- bowel sounds present, soft, non tender Musculoskeletal- able to move all 4 extremities, no leg edema,lower extremity weakness present, on wheelchair Neurological- alert and oriented to person only   Labs reviewed:  Recent Labs  08/23/15 1008 09/18/15 09/25/15  NA 140 140 142  K 4.2 4.4 4.4  CL 105  --   --   CO2 26  --   --   GLUCOSE 164*  --   --   BUN 14 17 13   CREATININE 1.24 1.2 1.3  CALCIUM 9.3  --   --     Recent Labs  06/13/15 09/25/15  AST 17 20  ALT 12 22  ALKPHOS 89 90    Recent Labs  06/13/15 08/23/15 08/23/15 1008 09/18/15  WBC 4.6 6.4 6.4 5.1  HGB 13.2*  --  13.7 12.9*  HCT 40*  --  41.5 40*  MCV  --   --  92.8  --   PLT 213  --  268 233   No results found for: TSH Lab Results  Component Value Date   HGBA1C 7.8 09/18/2015   Lab Results  Component Value Date   CHOL 171 09/25/2015   HDL 33* 09/25/2015   LDLCALC 105 09/25/2015   TRIG 168* 09/25/2015     Assessment/Plan  Scrotal skin tear Likely from his positioning and his urinary incontinence. Will continue perineal hygiene. Continue balmex cream for now. Will have gel cushion for his wheelchair to help relieve pressure. Monitor for signs of infection  Hyperlipidemia stable on lipitor, continue this  Chronic depression Continue celexa for now  DM Lab Results  Component Value Date   HGBA1C 7.8  09/18/2015   Monitor cbg. Continue metformin 500 mg daily and monitor. Continue lisinopril for renal protection  Glaucoma Continue latanoprost    Blanchie Serve, MD Internal Medicine [facility] [address] [place], [place] [address] Cell Phone (Monday-Friday 8 am - 5 pm): [phone] On Call: [phone] and follow prompts after 5 pm and on weekends Office Phone: [phone] Office Fax: [phone]

## 2016-01-08 ENCOUNTER — Encounter: Payer: Self-pay | Admitting: Internal Medicine

## 2016-01-08 ENCOUNTER — Non-Acute Institutional Stay (SKILLED_NURSING_FACILITY): Payer: Medicare Other | Admitting: Internal Medicine

## 2016-01-08 DIAGNOSIS — K5900 Constipation, unspecified: Secondary | ICD-10-CM | POA: Diagnosis not present

## 2016-01-08 DIAGNOSIS — K5909 Other constipation: Secondary | ICD-10-CM

## 2016-01-08 DIAGNOSIS — F0280 Dementia in other diseases classified elsewhere without behavioral disturbance: Secondary | ICD-10-CM

## 2016-01-08 DIAGNOSIS — G309 Alzheimer's disease, unspecified: Secondary | ICD-10-CM | POA: Diagnosis not present

## 2016-01-08 DIAGNOSIS — N400 Enlarged prostate without lower urinary tract symptoms: Secondary | ICD-10-CM | POA: Diagnosis not present

## 2016-01-08 NOTE — Progress Notes (Signed)
Patient ID: Derrick Rubio, male   DOB: 10-16-1935, 80 y.o.   MRN: [id number]   Location:    Nursing Home Room Number: [id number] Place of Service:  SNF (31)   Blanchie Serve, MD  Patient Care Team: Blanchie Serve, MD as PCP - General (Internal Medicine)  Extended Emergency Contact Information Primary Emergency Contact: Emma,Sandra Address: [address]          [place], [place] of [place] Phone: [phone] Work Phone: [phone] Mobile Phone: [phone] Relation: Spouse Secondary Emergency Contact: Tsang,William Address: [address]          [place], [place] [address] Johnnette Litter of [place] Phone: [phone] Mobile Phone: [phone] Relation: Son  Code Status:  DNR  Goals of care: Advanced Directive information Advanced Directives 12/08/2015  Does patient have an advance directive? Yes  Type of Advance Directive Out of facility DNR (pink MOST or yellow form)  Does patient want to make changes to advanced directive? No - Patient declined  Copy of advanced directive(s) in chart? Yes     Chief Complaint  Patient presents with  . Medical Management of Chronic Issues    Routine Visit    No Known Allergies  HPI:  Pt is a 80 y.o. male seen today for medical management of chronic diseases. No new concern from patient, his wife and nursing staff. No fall reported. No acute behavior concern.    ROS: limited with dementia Constitutional: Negative for fever Cardiovascular: Negative for chest pain Respiratory: Negative cshortness of breath Gastrointestinal: Negative for vomiting, abdominal pain.  Neurological: Negative for dizziness and headache Skin: Negative for rash and itching.     Past Medical History:  Diagnosis Date  . Cancer ([place])   . Dementia   . Depression   . 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 Diabetes mellitus without complication ([place])   . DVT (deep venous thrombosis) ([place]) 09/16/2013  . Glaucoma   .  Hypercholesteremia   . Latent tuberculosis 09/16/2013  . Latent tuberculosis   . Osteoarthritis 09/16/2013  . Pure hypercholesterolemia 10/18/2013   Past Surgical History:  Procedure Laterality Date  . COLONOSCOPY WITH PROPOFOL N/A 01/24/2014   Procedure: COLONOSCOPY WITH PROPOFOL;  Surgeon: Garlan Fair, MD;  [facility];  Service: Endoscopy;  Laterality: N/A;  . ivc filter placement         Medication List       Accurate as of 01/08/16  3:07 PM. Always use your most recent med list.          acetaminophen 500 MG tablet Commonly known as:  TYLENOL Take 500 mg by mouth every 4 (four) hours as needed for mild pain or fever.   atorvastatin 20 MG tablet Commonly known as:  LIPITOR Take 20 mg by mouth daily.   citalopram 10 MG tablet Commonly known as:  CELEXA Take 10 mg by mouth daily.   latanoprost 0.005 % ophthalmic solution Commonly known as:  XALATAN Instill one drop in each eye every night at bedtime for glaucoma. Wait 3-5 minutes between 2 eye med   lisinopril 2.5 MG tablet Commonly known as:  PRINIVIL,ZESTRIL Take 2.5 mg by mouth daily.   metFORMIN 500 MG tablet Commonly known as:  GLUCOPHAGE Take 500 mg by mouth daily with breakfast.   NAMENDA XR 28 MG Cp24 24 hr capsule Generic drug:  memantine Take 28 mg by mouth daily.   nystatin cream Commonly known as:  MYCOSTATIN Apply 1 application topically 2 (two) times  daily. Apply to scrotum after bathing   sennosides-docusate sodium 8.6-50 MG tablet Commonly known as:  SENOKOT-S Take 1 tablet by mouth 2 (two) times daily.   tamsulosin 0.4 MG Caps capsule Commonly known as:  FLOMAX Take 0.4 mg by mouth daily.        Immunization History  Administered Date(s) Administered  . Influenza-Unspecified 03/29/2014, 04/14/2015  . PPD Test 12/15/2013, 02/21/2015  . Pneumococcal-Unspecified 04/06/2014     Physical exam: Vitals:   01/08/16 1500  BP: 130/70  Pulse: 76  Resp: 20  Temp: 98 F  (36.7 C)  TempSrc: Oral  SpO2: 95%  Weight: 198 lb 12.8 oz (90.2 kg)  Height: 6' (1.829 m)   Body mass index is 26.96 kg/m.  General- elderly male in no acute distress Head- atraumatic, normocephalic Eyes- no pallor, no icterus Mouth- adentulous, moist mucus membrane Neck- no lymphadenopathy Cardiovascular- normal s1,s2, no murmur Respiratory- bilateral clear to auscultation, no wheeze, no rhonchi, no crackles Abdomen- bowel sounds present, soft, non tender Musculoskeletal- able to move all 4 extremities, no leg edema,lower extremity weakness present, on wheelchair Neurological- alert and oriented to person only   Labs reviewed:  Recent Labs  08/23/15 1008 09/18/15 09/25/15  NA 140 140 142  K 4.2 4.4 4.4  CL 105  --   --   CO2 26  --   --   GLUCOSE 164*  --   --   BUN 14 17 13   CREATININE 1.24 1.2 1.3  CALCIUM 9.3  --   --     Recent Labs  06/13/15 09/25/15  AST 17 20  ALT 12 22  ALKPHOS 89 90    Recent Labs  06/13/15 08/23/15 08/23/15 1008 09/18/15  WBC 4.6 6.4 6.4 5.1  HGB 13.2*  --  13.7 12.9*  HCT 40*  --  41.5 40*  MCV  --   --  92.8  --   PLT 213  --  268 233   No results found for: TSH Lab Results  Component Value Date   HGBA1C 7.8 09/18/2015   Lab Results  Component Value Date   CHOL 171 09/25/2015   HDL 33 (A) 09/25/2015   LDLCALC 105 09/25/2015   TRIG 168 (A) 09/25/2015     Assessment/Plan   BPH Stable, on flomax. Continue perineal care with his urinary incontinence  Constipation Continue senokot S bid, stable. Hydration to be maintained  Alzheimer's Dementia Continue namenda xr 28 mg daily, continue supportive care.     Blanchie Serve, MD Internal Medicine [facility] [address] [place], [place] [address] Cell Phone (Monday-Friday 8 am - 5 pm): [phone] On Call: [phone] and follow prompts after 5 pm and on weekends Office Phone: [phone] Office Fax: [phone]

## 2016-02-08 ENCOUNTER — Non-Acute Institutional Stay (SKILLED_NURSING_FACILITY): Payer: Medicare Other | Admitting: Internal Medicine

## 2016-02-08 ENCOUNTER — Encounter: Payer: Self-pay | Admitting: Internal Medicine

## 2016-02-08 DIAGNOSIS — E785 Hyperlipidemia, unspecified: Secondary | ICD-10-CM | POA: Diagnosis not present

## 2016-02-08 DIAGNOSIS — F329 Major depressive disorder, single episode, unspecified: Secondary | ICD-10-CM | POA: Diagnosis not present

## 2016-02-08 DIAGNOSIS — E1121 Type 2 diabetes mellitus with diabetic nephropathy: Secondary | ICD-10-CM

## 2016-02-08 NOTE — Progress Notes (Signed)
Patient ID: Derrick Rubio, male   DOB: 02-26-1936, 80 y.o.   MRN: [id number]   Location:    Nursing Home Room Number: [id number] Place of Service:  SNF (31)   Blanchie Serve, MD  Patient Care Team: Blanchie Serve, MD as PCP - General (Internal Medicine)  Extended Emergency Contact Information Primary Emergency Contact: Kabler,Sandra Address: [place]          [place], [place] of [place] Phone: [phone] Work Phone: [phone] Mobile Phone: [phone] Relation: Spouse Secondary Emergency Contact: Rosol,William Address: [address]          [place], [place] [address] Johnnette Litter of [place] Phone: [phone] Mobile Phone: [phone] Relation: Son  Code Status:  DNR  Goals of care: Advanced Directive information Advanced Directives 12/08/2015  Does patient have an advance directive? Yes  Type of Advance Directive Out of facility DNR (pink MOST or yellow form)  Does patient want to make changes to advanced directive? No - Patient declined  Copy of advanced directive(s) in chart? Yes     Chief Complaint  Patient presents with  . Medical Management of Chronic Issues    Routine Visit    No Known Allergies  HPI:  Pt is a 80 y.o. male seen today for medical management of chronic diseases. No new concern from patient and nursing staff. His dementia limits his ROS  ROS: limited with dementia Constitutional: Negative for fever Cardiovascular: Negative for chest pain Respiratory: Negative cshortness of breath Gastrointestinal: Negative for vomiting, abdominal pain.  Neurological: Negative for dizziness and headache Skin: Negative for rash and itching.     Past Medical History:  Diagnosis Date  . Cancer ([place])   . Dementia   . Depression   . 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 Diabetes mellitus without complication ([place])   . DVT (deep venous thrombosis) ([place]) 09/16/2013  . Glaucoma   . Hypercholesteremia   . Latent  tuberculosis 09/16/2013  . Latent tuberculosis   . Osteoarthritis 09/16/2013  . Pure hypercholesterolemia 10/18/2013   Past Surgical History:  Procedure Laterality Date  . COLONOSCOPY WITH PROPOFOL N/A 01/24/2014   Procedure: COLONOSCOPY WITH PROPOFOL;  Surgeon: Garlan Fair, MD;  [facility];  Service: Endoscopy;  Laterality: N/A;  . ivc filter placement         Medication List       Accurate as of 02/08/16 12:35 PM. Always use your most recent med list.          acetaminophen 500 MG tablet Commonly known as:  TYLENOL Take 500 mg by mouth every 4 (four) hours as needed for mild pain or fever.   atorvastatin 20 MG tablet Commonly known as:  LIPITOR Take 20 mg by mouth daily.   citalopram 10 MG tablet Commonly known as:  CELEXA Take 10 mg by mouth daily.   latanoprost 0.005 % ophthalmic solution Commonly known as:  XALATAN Instill one drop in each eye every night at bedtime for glaucoma. Wait 3-5 minutes between 2 eye med   lisinopril 2.5 MG tablet Commonly known as:  PRINIVIL,ZESTRIL Take 2.5 mg by mouth daily.   metFORMIN 500 MG tablet Commonly known as:  GLUCOPHAGE Take 500 mg by mouth daily with breakfast.   NAMENDA XR 28 MG Cp24 24 hr capsule Generic drug:  memantine Take 28 mg by mouth daily.   nystatin cream Commonly known as:  MYCOSTATIN Apply 1 application topically 2 (two) times daily. Apply to scrotum after bathing  sennosides-docusate sodium 8.6-50 MG tablet Commonly known as:  SENOKOT-S Take 1 tablet by mouth 2 (two) times daily.   tamsulosin 0.4 MG Caps capsule Commonly known as:  FLOMAX Take 0.4 mg by mouth daily.        Immunization History  Administered Date(s) Administered  . Influenza-Unspecified 03/29/2014, 04/14/2015  . PPD Test 12/15/2013, 02/21/2015  . Pneumococcal-Unspecified 04/06/2014     Physical exam: Vitals:   02/08/16 1230  BP: 124/70  Pulse: 68  Resp: 18  Temp: 97.4 F (36.3 C)  TempSrc: Oral    SpO2: 99%  Weight: 203 lb 3.2 oz (92.2 kg)  Height: 6' (1.829 m)   Body mass index is 27.56 kg/m.  General- elderly male in no acute distress Head- atraumatic, normocephalic Eyes- no pallor, no icterus Mouth- adentulous, moist mucus membrane Neck- no lymphadenopathy Cardiovascular- normal s1,s2, no murmur Respiratory- bilateral clear to auscultation, no wheeze, no rhonchi, no crackles Abdomen- bowel sounds present, soft, non tender Musculoskeletal- able to move all 4 extremities, no leg edema, lower extremity weakness present, on wheelchair Neurological- alert and oriented to person only   Labs reviewed:  Recent Labs  08/23/15 1008 09/18/15 09/25/15  NA 140 140 142  K 4.2 4.4 4.4  CL 105  --   --   CO2 26  --   --   GLUCOSE 164*  --   --   BUN 14 17 13   CREATININE 1.24 1.2 1.3  CALCIUM 9.3  --   --     Recent Labs  06/13/15 09/25/15  AST 17 20  ALT 12 22  ALKPHOS 89 90    Recent Labs  06/13/15 08/23/15 08/23/15 1008 09/18/15  WBC 4.6 6.4 6.4 5.1  HGB 13.2*  --  13.7 12.9*  HCT 40*  --  41.5 40*  MCV  --   --  92.8  --   PLT 213  --  268 233   No results found for: TSH Lab Results  Component Value Date   HGBA1C 7.8 09/18/2015   Lab Results  Component Value Date   CHOL 171 09/25/2015   HDL 33 (A) 09/25/2015   LDLCALC 105 09/25/2015   TRIG 168 (A) 09/25/2015     Assessment/Plan   Hyperlipidemia Continue lipitor 20 mg daily and check lipid panel  Diabetes mellitus with nephropathy Lab Results  Component Value Date   HGBA1C 7.8 09/18/2015   monitor cbg continue metformin 500 mg daily. Continue lisinopril 2.5 mg daily and monitor.   Chronic depression Stable mood, currently on celexa 10 mg daily, attempt GDR to 5 mg daily and monitor   Blanchie Serve, MD Internal Medicine [facility] [address] [place], [place] [address] Cell Phone (Monday-Friday 8 am - 5 pm): [phone] On Call: [phone] and  follow prompts after 5 pm and on weekends Office Phone: [phone] Office Fax: [phone]

## 2016-02-16 ENCOUNTER — Encounter (HOSPITAL_COMMUNITY): Payer: Self-pay | Admitting: Emergency Medicine

## 2016-02-16 ENCOUNTER — Emergency Department (HOSPITAL_COMMUNITY): Payer: Medicare Other

## 2016-02-16 ENCOUNTER — Inpatient Hospital Stay (HOSPITAL_COMMUNITY)
Admission: EM | Admit: 2016-02-16 | Discharge: 2016-02-20 | DRG: 871 | Disposition: A | Discharge status: Skilled Nursing Facility | Payer: Medicare Other | Attending: Internal Medicine | Admitting: Internal Medicine

## 2016-02-16 DIAGNOSIS — Z8546 Personal history of malignant neoplasm of prostate: Secondary | ICD-10-CM

## 2016-02-16 DIAGNOSIS — F0280 Dementia in other diseases classified elsewhere without behavioral disturbance: Secondary | ICD-10-CM | POA: Diagnosis present

## 2016-02-16 DIAGNOSIS — Z7401 Bed confinement status: Secondary | ICD-10-CM

## 2016-02-16 DIAGNOSIS — IMO0001 Reserved for inherently not codable concepts without codable children: Secondary | ICD-10-CM

## 2016-02-16 DIAGNOSIS — E870 Hyperosmolality and hypernatremia: Secondary | ICD-10-CM | POA: Diagnosis not present

## 2016-02-16 DIAGNOSIS — K5900 Constipation, unspecified: Secondary | ICD-10-CM

## 2016-02-16 DIAGNOSIS — E860 Dehydration: Secondary | ICD-10-CM

## 2016-02-16 DIAGNOSIS — G309 Alzheimer's disease, unspecified: Secondary | ICD-10-CM | POA: Diagnosis present

## 2016-02-16 DIAGNOSIS — E7800 Pure hypercholesterolemia, unspecified: Secondary | ICD-10-CM | POA: Diagnosis present

## 2016-02-16 DIAGNOSIS — F329 Major depressive disorder, single episode, unspecified: Secondary | ICD-10-CM | POA: Diagnosis present

## 2016-02-16 DIAGNOSIS — I952 Hypotension due to drugs: Secondary | ICD-10-CM | POA: Diagnosis present

## 2016-02-16 DIAGNOSIS — A419 Sepsis, unspecified organism: Principal | ICD-10-CM | POA: Diagnosis present

## 2016-02-16 DIAGNOSIS — G9340 Encephalopathy, unspecified: Secondary | ICD-10-CM | POA: Diagnosis present

## 2016-02-16 DIAGNOSIS — E1165 Type 2 diabetes mellitus with hyperglycemia: Secondary | ICD-10-CM | POA: Diagnosis present

## 2016-02-16 DIAGNOSIS — R4182 Altered mental status, unspecified: Secondary | ICD-10-CM

## 2016-02-16 DIAGNOSIS — T464X5A Adverse effect of angiotensin-converting-enzyme inhibitors, initial encounter: Secondary | ICD-10-CM | POA: Diagnosis present

## 2016-02-16 DIAGNOSIS — N179 Acute kidney failure, unspecified: Secondary | ICD-10-CM | POA: Diagnosis present

## 2016-02-16 DIAGNOSIS — Z7984 Long term (current) use of oral hypoglycemic drugs: Secondary | ICD-10-CM

## 2016-02-16 DIAGNOSIS — Z794 Long term (current) use of insulin: Secondary | ICD-10-CM

## 2016-02-16 DIAGNOSIS — H409 Unspecified glaucoma: Secondary | ICD-10-CM | POA: Diagnosis present

## 2016-02-16 DIAGNOSIS — Z66 Do not resuscitate: Secondary | ICD-10-CM | POA: Diagnosis present

## 2016-02-16 DIAGNOSIS — Z7189 Other specified counseling: Secondary | ICD-10-CM

## 2016-02-16 DIAGNOSIS — E876 Hypokalemia: Secondary | ICD-10-CM | POA: Diagnosis not present

## 2016-02-16 DIAGNOSIS — E1365 Other specified diabetes mellitus with hyperglycemia: Secondary | ICD-10-CM

## 2016-02-16 DIAGNOSIS — N390 Urinary tract infection, site not specified: Secondary | ICD-10-CM | POA: Diagnosis present

## 2016-02-16 DIAGNOSIS — K5909 Other constipation: Secondary | ICD-10-CM | POA: Diagnosis present

## 2016-02-16 DIAGNOSIS — Z993 Dependence on wheelchair: Secondary | ICD-10-CM

## 2016-02-16 DIAGNOSIS — E785 Hyperlipidemia, unspecified: Secondary | ICD-10-CM | POA: Diagnosis present

## 2016-02-16 DIAGNOSIS — R1310 Dysphagia, unspecified: Secondary | ICD-10-CM | POA: Diagnosis present

## 2016-02-16 DIAGNOSIS — N189 Chronic kidney disease, unspecified: Secondary | ICD-10-CM | POA: Diagnosis present

## 2016-02-16 DIAGNOSIS — I129 Hypertensive chronic kidney disease with stage 1 through stage 4 chronic kidney disease, or unspecified chronic kidney disease: Secondary | ICD-10-CM | POA: Diagnosis present

## 2016-02-16 DIAGNOSIS — Z86718 Personal history of other venous thrombosis and embolism: Secondary | ICD-10-CM

## 2016-02-16 DIAGNOSIS — Z515 Encounter for palliative care: Secondary | ICD-10-CM | POA: Diagnosis not present

## 2016-02-16 DIAGNOSIS — Z79899 Other long term (current) drug therapy: Secondary | ICD-10-CM

## 2016-02-16 DIAGNOSIS — IMO0002 Reserved for concepts with insufficient information to code with codable children: Secondary | ICD-10-CM | POA: Diagnosis present

## 2016-02-16 DIAGNOSIS — E1122 Type 2 diabetes mellitus with diabetic chronic kidney disease: Secondary | ICD-10-CM | POA: Diagnosis present

## 2016-02-16 DIAGNOSIS — G9341 Metabolic encephalopathy: Secondary | ICD-10-CM | POA: Diagnosis present

## 2016-02-16 DIAGNOSIS — Z8659 Personal history of other mental and behavioral disorders: Secondary | ICD-10-CM

## 2016-02-16 HISTORY — DX: Malignant neoplasm of prostate: C61

## 2016-02-16 HISTORY — DX: Chronic kidney disease, unspecified: N18.9

## 2016-02-16 LAB — CBC
HEMATOCRIT: 48.5 % (ref 39.0–52.0)
Hemoglobin: 16 g/dL (ref 13.0–17.0)
MCH: 31.3 pg (ref 26.0–34.0)
MCHC: 33 g/dL (ref 30.0–36.0)
MCV: 94.9 fL (ref 78.0–100.0)
PLATELETS: 249 10*3/uL (ref 150–400)
RBC: 5.11 MIL/uL (ref 4.22–5.81)
RDW: 13.7 % (ref 11.5–15.5)
WBC: 11.2 10*3/uL — ABNORMAL HIGH (ref 4.0–10.5)

## 2016-02-16 LAB — URINE MICROSCOPIC-ADD ON

## 2016-02-16 LAB — URINALYSIS, ROUTINE W REFLEX MICROSCOPIC
Bilirubin Urine: NEGATIVE
KETONES UR: 15 mg/dL — AB
Nitrite: NEGATIVE
PH: 5 (ref 5.0–8.0)
PROTEIN: 30 mg/dL — AB
Specific Gravity, Urine: 1.036 — ABNORMAL HIGH (ref 1.005–1.030)

## 2016-02-16 LAB — COMPREHENSIVE METABOLIC PANEL
ALBUMIN: 3.8 g/dL (ref 3.5–5.0)
ALT: 23 U/L (ref 17–63)
AST: 24 U/L (ref 15–41)
Alkaline Phosphatase: 79 U/L (ref 38–126)
Anion gap: 5 (ref 5–15)
BUN: 31 mg/dL — AB (ref 6–20)
CHLORIDE: 120 mmol/L — AB (ref 101–111)
CO2: 20 mmol/L — AB (ref 22–32)
CREATININE: 1.9 mg/dL — AB (ref 0.61–1.24)
Calcium: 9.2 mg/dL (ref 8.9–10.3)
GFR calc Af Amer: 37 mL/min — ABNORMAL LOW (ref >60)
GFR calc non Af Amer: 32 mL/min — ABNORMAL LOW (ref >60)
Glucose, Bld: 425 mg/dL — ABNORMAL HIGH (ref 65–99)
POTASSIUM: 4.3 mmol/L (ref 3.5–5.1)
SODIUM: 145 mmol/L (ref 135–145)
Total Bilirubin: 0.9 mg/dL (ref 0.3–1.2)
Total Protein: 7.5 g/dL (ref 6.5–8.1)

## 2016-02-16 LAB — I-STAT CG4 LACTIC ACID, ED: Lactic Acid, Venous: 2.26 mmol/L (ref 0.5–1.9)

## 2016-02-16 LAB — CBG MONITORING, ED: GLUCOSE-CAPILLARY: 389 mg/dL — AB (ref 65–99)

## 2016-02-16 MED ORDER — SODIUM CHLORIDE 0.9 % IV BOLUS (SEPSIS)
1000.0000 mL | Freq: Once | INTRAVENOUS | Status: AC
  Administered 2016-02-16: 1000 mL via INTRAVENOUS

## 2016-02-16 MED ORDER — INSULIN ASPART 100 UNIT/ML ~~LOC~~ SOLN
8.0000 [IU] | Freq: Once | SUBCUTANEOUS | Status: AC
  Administered 2016-02-17: 8 [IU] via INTRAVENOUS
  Filled 2016-02-16: qty 1

## 2016-02-16 NOTE — ED Provider Notes (Signed)
[facility] DEPT Provider Note   CSN: [id number] Arrival date & time: 02/16/16  2216  By signing my name below, I, Maud Deed. Royston Sinner, attest that this documentation has been prepared under the direction and in the presence of Veryl Speak, MD.  Electronically Signed: Maud Deed. Royston Sinner, ED Scribe. 02/16/16. 11:40 PM.    History   Chief Complaint Chief Complaint  Patient presents with  . Altered Mental Status   The history is provided by the spouse. No language interpreter was used.    LEVEL 5 CAVEAT- DEMENTIA/ALTERED MENTAL STATUS  HPI Comments: Derrick Rubio brought in by EMS is a 80 y.o. male with a PMHx of cancer, dementia, and DM who presents to the Emergency Department here for altered mental status this evening. Wife states pt has been significantly more lethargic and "sleepy" in the last week. She states "he has been in a much deeper sleep as the week progressed". She also reports loss of appetite as he has lost 23 pounds in the last month. Typically pt has been able to eat at least 1 meal a day but has not eaten in the last 7 days. Wife states his blood sugar has been "high" in the last few days. She was recently told his A1c spiked from a 7 to a 9. Wife states he as not moved his bowels in 5 days which is not baseline for him. Typically pt is able to talk and recognize his wife/friends. However, he has not been very responsive in the last few days. Metformin dosage was recently increased in the last week, however, she denies any other new medications changes. Pt has lived in a nursing facility for the last 2 years.  PCP: Blanchie Serve, MD    Past Medical History:  Diagnosis Date  . Cancer ([place])   . Dementia   . Depression   . 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 Diabetes mellitus without complication ([place])   . DVT (deep venous thrombosis) ([place]) 09/16/2013  . Glaucoma   . Hypercholesteremia   . Latent tuberculosis 09/16/2013  . Latent tuberculosis   . Osteoarthritis  09/16/2013  . Pure hypercholesterolemia 10/18/2013    Patient Active Problem List   Diagnosis Date Noted  . Major depression, chronic ([place]) 12/08/2015  . Chronic constipation 11/09/2015  . Type 2 diabetes mellitus with diabetic nephropathy, without long-term current use of insulin ([place]) 10/11/2015  . Microalbuminuria due to type 2 diabetes mellitus ([place]) 09/07/2015  . Alzheimer's disease 07/14/2015  . Type 2 diabetes mellitus with diabetic peripheral angiopathy without gangrene, without long-term current use of insulin ([place]) 07/14/2015  . Depression with anxiety 02/08/2015  . BPH without obstruction/lower urinary tract symptoms 10/24/2014  . Hyperlipidemia LDL goal <100 10/24/2014  . Type 2 diabetes with peripheral circulatory disorder, controlled ([place]) 10/24/2014  . Rectal bleeding 12/08/2013  . Glaucoma 11/02/2013  . 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 Alzheimer's dementia without behavioral disturbance 09/16/2013  . Osteoarthritis 09/16/2013    Past Surgical History:  Procedure Laterality Date  . COLONOSCOPY WITH PROPOFOL N/A 01/24/2014   Procedure: COLONOSCOPY WITH PROPOFOL;  Surgeon: Garlan Fair, MD;  Location: [facility] ENDOSCOPY;  Service: Endoscopy;  Laterality: N/A;  . ivc filter placement         Home Medications    Prior to Admission medications   Medication Sig Start Date End Date Taking? Authorizing Provider  acetaminophen (TYLENOL) 500 MG tablet Take 500 mg by mouth every 4 (four) hours as needed for mild pain  or fever.    Yes Historical Provider, MD  atorvastatin (LIPITOR) 20 MG tablet Take 20 mg by mouth daily.   Yes Historical Provider, MD  citalopram (CELEXA) 10 MG tablet Take 5 mg by mouth daily.    Yes Historical Provider, MD  insulin lispro (HUMALOG) 100 UNIT/ML injection Inject 2 Units into the skin once.   Yes Historical Provider, MD  latanoprost (XALATAN) 0.005 % ophthalmic solution Place 1 drop into both eyes at bedtime. Instill one drop in each eye  every night at bedtime for glaucoma. Wait 3-5 minutes between 2 eye med   Yes Historical Provider, MD  lisinopril (PRINIVIL,ZESTRIL) 2.5 MG tablet Take 2.5 mg by mouth daily. Hold for SBP <110   Yes Historical Provider, MD  memantine (NAMENDA XR) 28 MG CP24 24 hr capsule Take 28 mg by mouth daily.    Yes Historical Provider, MD  metFORMIN (GLUCOPHAGE) 500 MG tablet Take 500 mg by mouth 2 (two) times daily with a meal.    Yes Historical Provider, MD  nystatin cream (MYCOSTATIN) Apply 1 application topically 2 (two) times daily. Apply to scrotum after bathing   Yes Historical Provider, MD  polyethylene glycol (MIRALAX / GLYCOLAX) packet Take 17 g by mouth daily.   Yes Historical Provider, MD  saccharomyces boulardii (FLORASTOR) 250 MG capsule Take 250 mg by mouth 2 (two) times daily. For 15 days started on 02/13/16   Yes Historical Provider, MD  sennosides-docusate sodium (SENOKOT-S) 8.6-50 MG tablet Take 1 tablet by mouth 2 (two) times daily.   Yes Historical Provider, MD  tamsulosin (FLOMAX) 0.4 MG CAPS capsule Take 0.4 mg by mouth daily.    Yes Historical Provider, MD    Family History No family history on file.  Social History Social History  Substance Use Topics  . Smoking status: Never Smoker  . Smokeless tobacco: Never Used  . Alcohol use No     Allergies   Review of patient's allergies indicates no known allergies.   Review of Systems Review of Systems  Unable to perform ROS: Dementia     Physical Exam Updated Vital Signs BP 92/68 (BP Location: Left Arm)   Pulse 108   Temp 99.7 F (37.6 C) (Rectal)   Resp (!) 29   SpO2 98%   Physical Exam  Constitutional:  Elderly male. Appears chronically ill. Somnolent but will respond to noxious stimuli.   HENT:  Head: Normocephalic and atraumatic.  Neck: Normal range of motion.  Cardiovascular: Normal rate, regular rhythm, normal heart sounds and intact distal pulses.   Pulmonary/Chest: Effort normal and breath sounds normal.  No respiratory distress.  Abdominal: Soft. He exhibits no distension. There is no tenderness.  Musculoskeletal: Normal range of motion. He exhibits no edema.  Neurological:  Neurologic exam very limited secondary to baseline medical problems. He is noted to move all extremities. somnolent and difficult to arouse.  Skin: Skin is warm and dry.  Nursing note and vitals reviewed.    ED Treatments / Results   DIAGNOSTIC STUDIES: Oxygen Saturation is 98% on RA, Normal by my interpretation.    COORDINATION OF CARE: 11:38 PM- Will order blood work and imaging. Discussed treatment plan with wife at bedside and she agreed to plan.      Labs (all labs ordered are listed, but only abnormal results are displayed) Labs Reviewed  CBC - Abnormal; Notable for the following:       Result Value   WBC 11.2 (*)    All other components  within normal limits  CBG MONITORING, ED - Abnormal; Notable for the following:    Glucose-Capillary 389 (*)    All other components within normal limits  I-STAT CG4 LACTIC ACID, ED - Abnormal; Notable for the following:    Lactic Acid, Venous 2.26 (*)    All other components within normal limits  COMPREHENSIVE METABOLIC PANEL  URINALYSIS, ROUTINE W REFLEX MICROSCOPIC (NOT AT [facility])  CBG MONITORING, ED    EKG  EKG Interpretation None       Radiology No results found.  Procedures Procedures (including critical care time)  Medications Ordered in ED Medications - No data to display   Initial Impression / Assessment and Plan / ED Course  I have reviewed the triage vital signs and the nursing notes.  Pertinent labs & imaging results that were available during my care of the patient were reviewed by me and considered in my medical decision making (see chart for details).  Clinical Course    Workup reveals worsening renal function and evidence for a urinary tract infection. He will be admitted for IV fluids, antibiotics, and further  workup/observation.  Final Clinical Impressions(s) / ED Diagnoses   Final diagnoses:  None    New Prescriptions New Prescriptions   No medications on file   The patient's paper medical record is not available during this visit. It has been removed from this office and cannot be located.    Veryl Speak, MD 02/18/16 [phone]

## 2016-02-17 ENCOUNTER — Observation Stay (HOSPITAL_COMMUNITY): Payer: Medicare Other

## 2016-02-17 ENCOUNTER — Encounter (HOSPITAL_COMMUNITY): Payer: Self-pay | Admitting: Internal Medicine

## 2016-02-17 DIAGNOSIS — H409 Unspecified glaucoma: Secondary | ICD-10-CM | POA: Diagnosis present

## 2016-02-17 DIAGNOSIS — Z8546 Personal history of malignant neoplasm of prostate: Secondary | ICD-10-CM | POA: Diagnosis not present

## 2016-02-17 DIAGNOSIS — E860 Dehydration: Secondary | ICD-10-CM | POA: Diagnosis not present

## 2016-02-17 DIAGNOSIS — F0280 Dementia in other diseases classified elsewhere without behavioral disturbance: Secondary | ICD-10-CM | POA: Diagnosis present

## 2016-02-17 DIAGNOSIS — A419 Sepsis, unspecified organism: Secondary | ICD-10-CM | POA: Diagnosis present

## 2016-02-17 DIAGNOSIS — Z7401 Bed confinement status: Secondary | ICD-10-CM | POA: Diagnosis not present

## 2016-02-17 DIAGNOSIS — IMO0002 Reserved for concepts with insufficient information to code with codable children: Secondary | ICD-10-CM | POA: Diagnosis present

## 2016-02-17 DIAGNOSIS — I129 Hypertensive chronic kidney disease with stage 1 through stage 4 chronic kidney disease, or unspecified chronic kidney disease: Secondary | ICD-10-CM | POA: Diagnosis present

## 2016-02-17 DIAGNOSIS — E1165 Type 2 diabetes mellitus with hyperglycemia: Secondary | ICD-10-CM | POA: Diagnosis present

## 2016-02-17 DIAGNOSIS — G9340 Encephalopathy, unspecified: Secondary | ICD-10-CM | POA: Diagnosis not present

## 2016-02-17 DIAGNOSIS — N189 Chronic kidney disease, unspecified: Secondary | ICD-10-CM

## 2016-02-17 DIAGNOSIS — E870 Hyperosmolality and hypernatremia: Secondary | ICD-10-CM | POA: Diagnosis not present

## 2016-02-17 DIAGNOSIS — Z7189 Other specified counseling: Secondary | ICD-10-CM

## 2016-02-17 DIAGNOSIS — Z515 Encounter for palliative care: Secondary | ICD-10-CM | POA: Diagnosis not present

## 2016-02-17 DIAGNOSIS — E7800 Pure hypercholesterolemia, unspecified: Secondary | ICD-10-CM | POA: Diagnosis present

## 2016-02-17 DIAGNOSIS — G9341 Metabolic encephalopathy: Secondary | ICD-10-CM | POA: Diagnosis present

## 2016-02-17 DIAGNOSIS — N390 Urinary tract infection, site not specified: Secondary | ICD-10-CM | POA: Diagnosis present

## 2016-02-17 DIAGNOSIS — Z7984 Long term (current) use of oral hypoglycemic drugs: Secondary | ICD-10-CM | POA: Diagnosis not present

## 2016-02-17 DIAGNOSIS — F329 Major depressive disorder, single episode, unspecified: Secondary | ICD-10-CM | POA: Diagnosis present

## 2016-02-17 DIAGNOSIS — E785 Hyperlipidemia, unspecified: Secondary | ICD-10-CM | POA: Diagnosis present

## 2016-02-17 DIAGNOSIS — N179 Acute kidney failure, unspecified: Secondary | ICD-10-CM | POA: Diagnosis not present

## 2016-02-17 DIAGNOSIS — Z66 Do not resuscitate: Secondary | ICD-10-CM | POA: Diagnosis present

## 2016-02-17 DIAGNOSIS — Z79899 Other long term (current) drug therapy: Secondary | ICD-10-CM | POA: Diagnosis not present

## 2016-02-17 DIAGNOSIS — R4182 Altered mental status, unspecified: Secondary | ICD-10-CM | POA: Diagnosis present

## 2016-02-17 DIAGNOSIS — Z794 Long term (current) use of insulin: Secondary | ICD-10-CM | POA: Diagnosis not present

## 2016-02-17 DIAGNOSIS — E1122 Type 2 diabetes mellitus with diabetic chronic kidney disease: Secondary | ICD-10-CM | POA: Diagnosis present

## 2016-02-17 DIAGNOSIS — K5900 Constipation, unspecified: Secondary | ICD-10-CM | POA: Diagnosis not present

## 2016-02-17 DIAGNOSIS — G309 Alzheimer's disease, unspecified: Secondary | ICD-10-CM | POA: Diagnosis not present

## 2016-02-17 DIAGNOSIS — K5909 Other constipation: Secondary | ICD-10-CM | POA: Diagnosis present

## 2016-02-17 DIAGNOSIS — Z86718 Personal history of other venous thrombosis and embolism: Secondary | ICD-10-CM | POA: Diagnosis not present

## 2016-02-17 LAB — GLUCOSE, CAPILLARY
GLUCOSE-CAPILLARY: 273 mg/dL — AB (ref 65–99)
GLUCOSE-CAPILLARY: 452 mg/dL — AB (ref 65–99)
Glucose-Capillary: 302 mg/dL — ABNORMAL HIGH (ref 65–99)
Glucose-Capillary: 248 mg/dL — ABNORMAL HIGH (ref 65–99)
Glucose-Capillary: 217 mg/dL — ABNORMAL HIGH (ref 65–99)

## 2016-02-17 LAB — CBG MONITORING, ED
Glucose-Capillary: 332 mg/dL — ABNORMAL HIGH (ref 65–99)
Glucose-Capillary: 337 mg/dL — ABNORMAL HIGH (ref 65–99)

## 2016-02-17 LAB — MRSA PCR SCREENING: MRSA BY PCR: NEGATIVE

## 2016-02-17 LAB — APTT: APTT: 26 s (ref 24–36)

## 2016-02-17 LAB — CBC
HCT: 46.8 % (ref 39.0–52.0)
HEMOGLOBIN: 15.1 g/dL (ref 13.0–17.0)
MCH: 30.6 pg (ref 26.0–34.0)
MCHC: 32.3 g/dL (ref 30.0–36.0)
MCV: 94.7 fL (ref 78.0–100.0)
Platelets: 220 10*3/uL (ref 150–400)
RBC: 4.94 MIL/uL (ref 4.22–5.81)
RDW: 13.9 % (ref 11.5–15.5)
WBC: 10.9 10*3/uL — ABNORMAL HIGH (ref 4.0–10.5)

## 2016-02-17 LAB — GLUCOSE, RANDOM: GLUCOSE: 466 mg/dL — AB (ref 65–99)

## 2016-02-17 LAB — PROCALCITONIN

## 2016-02-17 LAB — LACTIC ACID, PLASMA
LACTIC ACID, VENOUS: 1.5 mmol/L (ref 0.5–1.9)
Lactic Acid, Venous: 1.3 mmol/L (ref 0.5–1.9)

## 2016-02-17 LAB — BASIC METABOLIC PANEL
ANION GAP: 9 (ref 5–15)
BUN: 27 mg/dL — ABNORMAL HIGH (ref 6–20)
CALCIUM: 9.3 mg/dL (ref 8.9–10.3)
CO2: 20 mmol/L — AB (ref 22–32)
Chloride: 122 mmol/L — ABNORMAL HIGH (ref 101–111)
Creatinine, Ser: 1.64 mg/dL — ABNORMAL HIGH (ref 0.61–1.24)
GFR, EST AFRICAN AMERICAN: 45 mL/min — AB (ref >60)
GFR, EST NON AFRICAN AMERICAN: 38 mL/min — AB (ref >60)
Glucose, Bld: 321 mg/dL — ABNORMAL HIGH (ref 65–99)
Potassium: 4 mmol/L (ref 3.5–5.1)
Sodium: 151 mmol/L — ABNORMAL HIGH (ref 135–145)

## 2016-02-17 LAB — PROTIME-INR
INR: 1.17
Prothrombin Time: 14.9 seconds (ref 11.4–15.2)

## 2016-02-17 LAB — I-STAT CG4 LACTIC ACID, ED: Lactic Acid, Venous: 2 mmol/L (ref 0.5–1.9)

## 2016-02-17 MED ORDER — LATANOPROST 0.005 % OP SOLN
1.0000 [drp] | Freq: Every day | OPHTHALMIC | Status: DC
  Administered 2016-02-18 – 2016-02-19 (×3): 1 [drp] via OPHTHALMIC
  Filled 2016-02-17: qty 2.5

## 2016-02-17 MED ORDER — SODIUM CHLORIDE 0.9 % IV BOLUS (SEPSIS)
1000.0000 mL | Freq: Once | INTRAVENOUS | Status: AC
  Administered 2016-02-17: 1000 mL via INTRAVENOUS

## 2016-02-17 MED ORDER — DEXTROSE-NACL 5-0.45 % IV SOLN
INTRAVENOUS | Status: DC
  Filled 2016-02-17 (×2): qty 1000

## 2016-02-17 MED ORDER — INSULIN ASPART 100 UNIT/ML ~~LOC~~ SOLN
20.0000 [IU] | Freq: Once | SUBCUTANEOUS | Status: AC
  Administered 2016-02-17: 20 [IU] via SUBCUTANEOUS

## 2016-02-17 MED ORDER — INSULIN GLARGINE 100 UNIT/ML ~~LOC~~ SOLN
5.0000 [IU] | Freq: Every day | SUBCUTANEOUS | Status: DC
  Administered 2016-02-18 – 2016-02-19 (×3): 5 [IU] via SUBCUTANEOUS
  Filled 2016-02-17 (×4): qty 0.05

## 2016-02-17 MED ORDER — DEXTROSE 5 % IV SOLN
2.0000 g | Freq: Once | INTRAVENOUS | Status: AC
  Administered 2016-02-17: 2 g via INTRAVENOUS
  Filled 2016-02-17: qty 2

## 2016-02-17 MED ORDER — TAMSULOSIN HCL 0.4 MG PO CAPS
0.4000 mg | ORAL_CAPSULE | Freq: Every day | ORAL | Status: DC
  Administered 2016-02-17 – 2016-02-20 (×4): 0.4 mg via ORAL
  Filled 2016-02-17 (×4): qty 1

## 2016-02-17 MED ORDER — BISACODYL 10 MG RE SUPP
10.0000 mg | Freq: Every day | RECTAL | Status: DC | PRN
  Administered 2016-02-17: 10 mg via RECTAL
  Filled 2016-02-17 (×2): qty 1

## 2016-02-17 MED ORDER — INSULIN ASPART 100 UNIT/ML ~~LOC~~ SOLN: 8.0000 [IU] | Freq: Once | SUBCUTANEOUS | Status: DC

## 2016-02-17 MED ORDER — INSULIN ASPART 100 UNIT/ML ~~LOC~~ SOLN
0.0000 [IU] | SUBCUTANEOUS | Status: DC
  Administered 2016-02-17: 5 [IU] via SUBCUTANEOUS
  Administered 2016-02-17: 11 [IU] via SUBCUTANEOUS
  Administered 2016-02-17 (×2): 5 [IU] via SUBCUTANEOUS
  Administered 2016-02-18: 3 [IU] via SUBCUTANEOUS
  Administered 2016-02-18 (×2): 11 [IU] via SUBCUTANEOUS
  Administered 2016-02-18 (×2): 8 [IU] via SUBCUTANEOUS
  Administered 2016-02-19: 11 [IU] via SUBCUTANEOUS
  Administered 2016-02-19: 3 [IU] via SUBCUTANEOUS
  Administered 2016-02-19: 8 [IU] via SUBCUTANEOUS
  Administered 2016-02-19: 15 [IU] via SUBCUTANEOUS
  Administered 2016-02-20: 8 [IU] via SUBCUTANEOUS
  Administered 2016-02-20: 5 [IU] via SUBCUTANEOUS
  Administered 2016-02-20: 3 [IU] via SUBCUTANEOUS
  Administered 2016-02-20 (×2): 8 [IU] via SUBCUTANEOUS

## 2016-02-17 MED ORDER — ATORVASTATIN CALCIUM 20 MG PO TABS
20.0000 mg | ORAL_TABLET | Freq: Every day | ORAL | Status: DC
  Administered 2016-02-17 – 2016-02-20 (×4): 20 mg via ORAL
  Filled 2016-02-17 (×4): qty 1

## 2016-02-17 MED ORDER — ONDANSETRON HCL 4 MG PO TABS: 4.0000 mg | ORAL_TABLET | Freq: Four times a day (QID) | ORAL | Status: DC | PRN

## 2016-02-17 MED ORDER — ONDANSETRON HCL 4 MG/2ML IJ SOLN: 4.0000 mg | Freq: Four times a day (QID) | INTRAMUSCULAR | Status: DC | PRN

## 2016-02-17 MED ORDER — ACETAMINOPHEN 325 MG PO TABS: 650.0000 mg | ORAL_TABLET | Freq: Four times a day (QID) | ORAL | Status: DC | PRN

## 2016-02-17 MED ORDER — FLEET ENEMA 7-19 GM/118ML RE ENEM
1.0000 | ENEMA | Freq: Once | RECTAL | Status: AC | PRN
  Administered 2016-02-18: 1 via RECTAL
  Filled 2016-02-17: qty 1

## 2016-02-17 MED ORDER — POLYETHYLENE GLYCOL 3350 17 G PO PACK
17.0000 g | PACK | Freq: Every day | ORAL | Status: DC
  Administered 2016-02-17 – 2016-02-20 (×4): 17 g via ORAL
  Filled 2016-02-17 (×4): qty 1

## 2016-02-17 MED ORDER — SENNOSIDES-DOCUSATE SODIUM 8.6-50 MG PO TABS
1.0000 | ORAL_TABLET | Freq: Two times a day (BID) | ORAL | Status: DC
  Administered 2016-02-17 – 2016-02-20 (×6): 1 via ORAL
  Filled 2016-02-17 (×7): qty 1

## 2016-02-17 MED ORDER — ENOXAPARIN SODIUM 40 MG/0.4ML ~~LOC~~ SOLN
40.0000 mg | Freq: Every day | SUBCUTANEOUS | Status: DC
  Administered 2016-02-17 – 2016-02-20 (×4): 40 mg via SUBCUTANEOUS
  Filled 2016-02-17 (×4): qty 0.4

## 2016-02-17 MED ORDER — CITALOPRAM HYDROBROMIDE 10 MG PO TABS
5.0000 mg | ORAL_TABLET | Freq: Every day | ORAL | Status: DC
  Administered 2016-02-17 – 2016-02-20 (×4): 5 mg via ORAL
  Filled 2016-02-17 (×4): qty 1

## 2016-02-17 MED ORDER — SACCHAROMYCES BOULARDII 250 MG PO CAPS
250.0000 mg | ORAL_CAPSULE | Freq: Two times a day (BID) | ORAL | Status: DC
Start: 2016-02-17 — End: 2016-02-20
  Administered 2016-02-17 – 2016-02-20 (×6): 250 mg via ORAL
  Filled 2016-02-17 (×7): qty 1

## 2016-02-17 MED ORDER — DEXTROSE-NACL 5-0.45 % IV SOLN
INTRAVENOUS | Status: DC
  Administered 2016-02-17 (×2): 1000 mL via INTRAVENOUS

## 2016-02-17 MED ORDER — SODIUM CHLORIDE 0.9 % IV BOLUS (SEPSIS): 1000.0000 mL | Freq: Once | INTRAVENOUS | Status: DC

## 2016-02-17 MED ORDER — SODIUM CHLORIDE 0.9% FLUSH
3.0000 mL | Freq: Two times a day (BID) | INTRAVENOUS | Status: DC
Start: 2016-02-17 — End: 2016-02-20
  Administered 2016-02-17 – 2016-02-19 (×3): 3 mL via INTRAVENOUS

## 2016-02-17 MED ORDER — DEXTROSE 5 % IV SOLN
1.0000 g | Freq: Two times a day (BID) | INTRAVENOUS | Status: DC
  Filled 2016-02-17: qty 1

## 2016-02-17 MED ORDER — NYSTATIN 100000 UNIT/GM EX CREA
1.0000 | TOPICAL_CREAM | Freq: Two times a day (BID) | CUTANEOUS | Status: DC
Start: 2016-02-17 — End: 2016-02-20
  Administered 2016-02-17 – 2016-02-20 (×7): 1 via TOPICAL
  Filled 2016-02-17: qty 15

## 2016-02-17 MED ORDER — ACETAMINOPHEN 650 MG RE SUPP
650.0000 mg | Freq: Four times a day (QID) | RECTAL | Status: DC | PRN
  Administered 2016-02-17: 650 mg via RECTAL
  Filled 2016-02-17: qty 1

## 2016-02-17 MED ORDER — MEMANTINE HCL ER 28 MG PO CP24
28.0000 mg | ORAL_CAPSULE | Freq: Every day | ORAL | Status: DC
  Administered 2016-02-17 – 2016-02-20 (×4): 28 mg via ORAL
  Filled 2016-02-17 (×4): qty 1

## 2016-02-17 MED ORDER — DEXTROSE 5 % IV SOLN
2.0000 g | INTRAVENOUS | Status: DC
  Administered 2016-02-18 – 2016-02-19 (×2): 2 g via INTRAVENOUS
  Filled 2016-02-17 (×2): qty 2

## 2016-02-17 NOTE — Progress Notes (Signed)
PROGRESS NOTE  Derrick Rubio  [id number] DOB: 11-Nov-1935 DOA: 02/16/2016 PCP: Blanchie Serve, MD Outpatient Specialists:  Subjective: Patient is lethargic, only responding to painful stimuli.  Brief Narrative:  Derrick Rubio is a 80 y.o. male with medical history significant of advanced dementia, DM, HLD, and remote prostate CA presenting with AMS.  His wife is his historian as he is currently obtunded.  Sunday, he vomited.  Monday, it was worse and he stopped eating.  Was not waking up as usual  - sleeping more than usual.  Today, his wife was unable to wake him up.  Glucose very high.  Decreased UOP.  Has had a yeast infection.  Bowels have not moved since Sunday - had a suppository then with results.  Received Miralax x 1 last night.  Decreasded PO intake - has lost 23 pounds in a month.  Dementia since 2007 - has been fairly stable the last year but in the last month decreased PO.    Assessment & Plan:   Active Problems:   Alzheimer\'s dementia without behavioral disturbance   Glaucoma   Hyperlipidemia LDL goal <100   Chronic constipation   Dehydration   Diabetes mellitus type 2, uncontrolled (HCC)   Acute kidney injury superimposed on chronic kidney disease (HCC)   Goals of care, counseling/discussion   Sepsis (HCC)   Acute encephalopathy   This is a no charge note, patient seen earlier today by my colleague Dr. Yates. Patient seen and examined, and data base reviewed. Sepsis syndrome likely secondary to UTI, acute metabolic encephalopathy likely secondary to sepsis. Started on cefepime, continue antibiotics.   DVT prophylaxis:  Code Status: DNR Family Communication:  Disposition Plan:  Diet: Diet NPO time specified  Consultants:   None  Procedures:   None  Antimicrobials:   None   Objective: Vitals:   02/17/16 0230 02/17/16 0245 02/17/16 0314 02/17/16 0611  BP: 101/74 104/74 105/69 104/73  Pulse:   94 83  Resp: 15 16 19 20  Temp:   97.8 F  (36.6 C) 97.9 F (36.6 C)  TempSrc:   Oral   SpO2:   100% 98%  Weight:   80 kg (176 lb 4.8 oz)   Height:   6\' 1" (1.854 m)     Intake/Output Summary (Last 24 hours) at 02/17/16 1041 Last data filed at 02/17/16 0600  Gross per 24 hour  Intake               50 ml  Output                0 ml  Net               50  ml   Filed Weights   02/17/16 0314  Weight: 80 kg (176 lb 4.8 oz)    Examination: General exam: Appears calm and comfortable  Respiratory system: Clear to auscultation. Respiratory effort normal. Cardiovascular system: S1 & S2 heard, RRR. No JVD, murmurs, rubs, gallops or clicks. No pedal edema. Gastrointestinal system: Abdomen is nondistended, soft and nontender. No organomegaly or masses felt. Normal bowel sounds heard. Central nervous system: Alert and oriented. No focal neurological deficits. Extremities: Symmetric 5 x 5 power. Skin: No rashes, lesions or ulcers Psychiatry: Judgement and insight appear normal. Mood & affect appropriate.   Data Reviewed: I have personally reviewed following labs and imaging studies  CBC:  Recent Labs Lab 02/16/16 2304 02/17/16 0429  WBC 11.2* 10.9*  HGB 16.0 15.1  HCT 48.5 46.8  MCV 94.9 94.7  PLT 249 [id number]   Basic Metabolic Panel:  Recent Labs Lab 02/16/16 2304 02/17/16 0429  NA 145 151*  K 4.3 4.0  CL 120* 122*  CO2 20* 20*  GLUCOSE 425* 321*  BUN 31* 27*  CREATININE 1.90* 1.64*  CALCIUM 9.2 9.3   GFR: Estimated Creatinine Clearance: 42 mL/min (by C-G formula based on SCr of 1.64 mg/dL). Liver Function Tests:  Recent Labs Lab 02/16/16 2304  AST 24  ALT 23  ALKPHOS 79  BILITOT 0.9  PROT 7.5  ALBUMIN 3.8   No results for input(s): LIPASE, AMYLASE in the last 168 hours. No results for input(s): AMMONIA in the last 168 hours. Coagulation Profile:  Recent Labs Lab 02/17/16 0429  INR 1.17   Cardiac Enzymes: No results for input(s): CKTOTAL, CKMB, CKMBINDEX, TROPONINI in the last 168 hours. BNP  (last 3 results) No results for input(s): PROBNP in the last 8760 hours. HbA1C: No results for input(s): HGBA1C in the last 72 hours. CBG:  Recent Labs Lab 02/17/16 0039 02/17/16 0124 02/17/16 0335 02/17/16 0627 02/17/16 0753  GLUCAP 332* 337* 302* 273* 248*   Lipid Profile: No results for input(s): CHOL, HDL, LDLCALC, TRIG, CHOLHDL, LDLDIRECT in the last 72 hours. Thyroid Function Tests: No results for input(s): TSH, T4TOTAL, FREET4, T3FREE, THYROIDAB in the last 72 hours. Anemia Panel: No results for input(s): VITAMINB12, FOLATE, FERRITIN, TIBC, IRON, RETICCTPCT in the last 72 hours. Urine analysis:    Component Value Date/Time   COLORURINE YELLOW 02/16/2016 2255   APPEARANCEUR TURBID (A) 02/16/2016 2255   APPEARANCEUR Clear 05/14/2014 2030   LABSPEC 1.036 (H) 02/16/2016 2255   LABSPEC 1.021 05/14/2014 2030   PHURINE 5.0 02/16/2016 2255   GLUCOSEU >1000 (A) 02/16/2016 2255   GLUCOSEU Negative 05/14/2014 2030   HGBUR MODERATE (A) 02/16/2016 2255   BILIRUBINUR NEGATIVE 02/16/2016 2255   BILIRUBINUR Negative 05/14/2014 2030   KETONESUR 15 (A) 02/16/2016 2255   PROTEINUR 30 (A) 02/16/2016 2255   NITRITE NEGATIVE 02/16/2016 2255   LEUKOCYTESUR MODERATE (A) 02/16/2016 2255   LEUKOCYTESUR Negative 05/14/2014 2030   Sepsis Labs: @LABRCNTIP (procalcitonin:4,lacticidven:4)  ) Recent Results (from the past 240 hour(s))  MRSA PCR Screening     Status: None   Collection Time: 02/17/16  3:48 AM  Result Value Ref Range Status   MRSA by PCR NEGATIVE NEGATIVE Final    Comment:        The GeneXpert MRSA Assay (FDA approved for NASAL specimens only), is one component of a comprehensive MRSA colonization surveillance program. It is not intended to diagnose MRSA infection nor to guide or monitor treatment for MRSA infections.      Invalid input(s): PROCALCITONIN, LACTICACIDVEN   Radiology Studies: Ct Head Wo Contrast  Result Date: 02/17/2016 CLINICAL DATA:  Altered  level of consciousness. History of diabetes, dementia, and cancer. EXAM: CT HEAD WITHOUT CONTRAST TECHNIQUE: Contiguous axial images were obtained from the base of the skull through the vertex without intravenous contrast. COMPARISON:  05/20/2010 FINDINGS: Brain: Diffuse cerebral atrophy. Ventricular dilatation consistent with central atrophy. Low-attenuation changes in the deep white matter consistent with small vessel ischemia. No mass effect or midline shift. No abnormal extra-axial fluid collections. Gray-white matter junctions are distinct. Basal cisterns are not effaced. No evidence of acute intracranial hemorrhage. Vascular: Vascular calcifications. Skull: No depressed skull fractures. Sinuses/Orbits: No acute finding. Other: No significant change since previous study. IMPRESSION: No acute intracranial abnormalities. Chronic atrophy and small vessel ischemic changes. Electronically Signed   By:  Lucienne Capers M.D.   On: 02/17/2016 00:09   Dg Abd Portable 1v  Result Date: 02/17/2016 CLINICAL DATA:  Constipation. EXAM: PORTABLE ABDOMEN - 1 VIEW COMPARISON:  None. FINDINGS: There is moderate stool in the transverse colon, descending colon, sigmoid colon and rectum. No all air distended small bowel loops to suggest obstruction. The soft tissue shadows are maintained. An IVC filter is noted. The bony structures are intact. Degenerative changes noted in the lower lumbar spine. IMPRESSION: Moderate stool throughout the colon suggesting constipation. No findings for small bowel obstruction or free air. Electronically Signed   By: Marijo Sanes M.D.   On: 02/17/2016 07:06        Scheduled Meds: . atorvastatin  20 mg Oral Daily  . ceFEPime (MAXIPIME) IV  1 g Intravenous Q12H  . citalopram  5 mg Oral Daily  . enoxaparin (LOVENOX) injection  40 mg Subcutaneous Daily  . insulin aspart  0-15 Units Subcutaneous Q4H  . insulin glargine  5 Units Subcutaneous QHS  . latanoprost  1 drop Both Eyes QHS  .  memantine  28 mg Oral Daily  . nystatin cream  1 application Topical BID  . polyethylene glycol  17 g Oral Daily  . saccharomyces boulardii  250 mg Oral BID  . senna-docusate  1 tablet Oral BID  . sodium chloride flush  3 mL Intravenous Q12H  . tamsulosin  0.4 mg Oral Daily   Continuous Infusions: . dextrose 5 % and 0.45% NaCl 1,000 mL (02/17/16 0824)     LOS: 0 days    Time spent: 35 minutes    Leo Weyandt A, MD [facility] Pager [phone]  If 7PM-7AM, please contact night-coverage [email or web address] Password [email or web address] 02/17/2016, 10:41 AM

## 2016-02-17 NOTE — Consult Note (Signed)
Consultation Note Date: 02/17/2016   Patient Name: Derrick Rubio  DOB: August 28, 1935  MRN: [id number]  Age / Sex: 80 y.o., male  PCP: Blanchie Serve, MD Referring Physician: Verlee Monte, MD  Reason for Consultation: Establishing goals of care  HPI/Patient Profile: 80 y.o. male  with past medical history of Moderate to severe dementia, diabetes, DVT,,, osteoarthritis, prostate cancer, hyperlipidemia, chronic kidney disease, admitted on 02/16/2016 with change in mental status, decreased oral intake. Patient was diagnosed with dementia since 2007 but has hadn't had a sharp functional decline over the last year. He has lost over 23 pounds since the first of this year. Upon presentation patient was admitted with a diagnosis of sepsis with likely urinary source. His lactic acid level was 2.0  .Clinical Assessment and Goals of Care: Per spouse, patient is more alert than he was yesterday however he is still very lethargic. He does occasionally open his eyes spontaneously. He does not follow commands. He is showing little interest in food. His wife reports that she goes to the nursing home 7 days a week and feeds him 3 meals a day and that up until 2 weeks ago he was still eating. As noted despite eating, and an albumin level of 3.5, it is noted that he is lost 23 pounds. His voice is a whisper; he is not conversational. He is unable to perform activities of daily living. He is either wheelchair or bedbound.  HCPOA wife, Derrick Rubio [phone] or [phone]    SUMMARY OF RECOMMENDATIONS   DNR/DNI No PEG tube Goal is not to return to the hospital and treatment for further infections Would only want antibiotics or IV fluids if they can to be administered in skilled nursing facility [facility] treatment plan and course of antibiotics to for this hospitalization then will discharge back to skilled nursing facility with  comfort care. MOST form completed and on patient's chart to be transported with him when he is ready for discharge back to skilled nursing facility  Code Status/Advance Care Planning:  DNR    Symptom Management:   No nonverbal signs and symptoms of pain or increased work of breathing  Palliative Prophylaxis:   Aspiration, Bowel Regimen, Delirium Protocol, Frequent Pain Assessment, Oral Care and Turn Reposition  Additional Recommendations (Limitations, Scope, Preferences):  Full Comfort Care once current course of treatment completed during this hospitalization.  Psycho-social/Spiritual:   Desire for further Chaplaincy support:no  Additional Recommendations: Grief/Bereavement Support  Prognosis:   Unable to determine  Discharge Planning: [place] for rehab with Palliative care service follow-up      Primary Diagnoses: Present on Admission: . Dehydration . Alzheimer's dementia without behavioral disturbance . Chronic constipation . Hyperlipidemia LDL goal <100 . Glaucoma . Diabetes mellitus type 2, uncontrolled ([place]) . Acute kidney injury superimposed on chronic kidney disease ([place]) . Sepsis ([place]) . Acute encephalopathy   I have reviewed the medical record, interviewed the patient and family, and examined the patient. The following aspects are pertinent.  Past Medical History:  Diagnosis Date  .  CKD (chronic kidney disease)   . Dementia   . 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 Diabetes mellitus without complication ([place])   . DVT (deep venous thrombosis) ([place]) 09/16/2013  . Glaucoma   . Latent tuberculosis 09/16/2013  . Osteoarthritis 09/16/2013  . Prostate cancer ([place]) 2006   s/p seed radiation  . Pure hypercholesterolemia 10/18/2013   Social History   Social History  . Marital status: Married    Spouse name: N/A  . Number of children: N/A  . Years of education: N/A   Social History Main Topics  . Smoking status: Never Smoker  .  Smokeless tobacco: Never Used  . Alcohol use No  . Drug use: No  . Sexual activity: Not Currently   Other Topics Concern  . None   Social History Narrative  . None   No family history on file. Scheduled Meds: . atorvastatin  20 mg Oral Daily  . [START ON 02/18/2016] ceFEPime (MAXIPIME) IV  2 g Intravenous Q24H  . citalopram  5 mg Oral Daily  . enoxaparin (LOVENOX) injection  40 mg Subcutaneous Daily  . insulin aspart  0-15 Units Subcutaneous Q4H  . insulin glargine  5 Units Subcutaneous QHS  . latanoprost  1 drop Both Eyes QHS  . memantine  28 mg Oral Daily  . nystatin cream  1 application Topical BID  . polyethylene glycol  17 g Oral Daily  . saccharomyces boulardii  250 mg Oral BID  . senna-docusate  1 tablet Oral BID  . sodium chloride flush  3 mL Intravenous Q12H  . tamsulosin  0.4 mg Oral Daily   Continuous Infusions: . dextrose 5 % and 0.45% NaCl 1,000 mL (02/17/16 0824)   PRN Meds:.acetaminophen **OR** acetaminophen, bisacodyl, ondansetron **OR** ondansetron (ZOFRAN) IV, sodium phosphate Medications Prior to Admission:  Prior to Admission medications   Medication Sig Start Date End Date Taking? Authorizing Provider  acetaminophen (TYLENOL) 500 MG tablet Take 500 mg by mouth every 4 (four) hours as needed for mild pain or fever.    Yes Historical Provider, MD  atorvastatin (LIPITOR) 20 MG tablet Take 20 mg by mouth daily.   Yes Historical Provider, MD  citalopram (CELEXA) 10 MG tablet Take 5 mg by mouth daily.    Yes Historical Provider, MD  latanoprost (XALATAN) 0.005 % ophthalmic solution Place 1 drop into both eyes at bedtime. Instill one drop in each eye every night at bedtime for glaucoma. Wait 3-5 minutes between 2 eye med   Yes Historical Provider, MD  lisinopril (PRINIVIL,ZESTRIL) 2.5 MG tablet Take 2.5 mg by mouth daily. Hold for SBP <110   Yes Historical Provider, MD  memantine (NAMENDA XR) 28 MG CP24 24 hr capsule Take 28 mg by mouth daily.    Yes Historical  Provider, MD  metFORMIN (GLUCOPHAGE) 500 MG tablet Take 500 mg by mouth 2 (two) times daily with a meal.    Yes Historical Provider, MD  nystatin cream (MYCOSTATIN) Apply 1 application topically 2 (two) times daily. Apply to scrotum after bathing   Yes Historical Provider, MD  polyethylene glycol (MIRALAX / GLYCOLAX) packet Take 17 g by mouth daily.   Yes Historical Provider, MD  saccharomyces boulardii (FLORASTOR) 250 MG capsule Take 250 mg by mouth 2 (two) times daily. For 15 days started on 02/13/16   Yes Historical Provider, MD  sennosides-docusate sodium (SENOKOT-S) 8.6-50 MG tablet Take 1 tablet by mouth 2 (two) times daily.   Yes Historical Provider, MD  tamsulosin (FLOMAX) 0.4  MG CAPS capsule Take 0.4 mg by mouth daily.    Yes Historical Provider, MD   No Known Allergies Review of Systems  Unable to perform ROS: Dementia    Physical Exam  Vital Signs: BP 102/70   Pulse 85   Temp 97.9 F (36.6 C)   Resp 20   Ht 6\' 1"  (1.854 m)   Wt 80 kg (176 lb 4.8 oz)   SpO2 100%   BMI 23.26 kg/m  Pain Assessment: PAINAD   Pain Score: 0-No pain   SpO2: SpO2: 100 % O2 Device:SpO2: 100 % O2 Flow Rate: .   IO: Intake/output summary:  Intake/Output Summary (Last 24 hours) at 02/17/16 1725 Last data filed at 02/17/16 1451  Gross per 24 hour  Intake               50 ml  Output                0 ml  Net               50 ml    LBM: Last BM Date: 02/11/16 Baseline Weight: Weight: 80 kg (176 lb 4.8 oz) Most recent weight: Weight: 80 kg (176 lb 4.8 oz)     Palliative Assessment/Data:   Flowsheet Rows   Flowsheet Row Most Recent Value  Intake Tab  Referral Department  Hospitalist  Unit at Time of Referral  Med/Surg Unit  Palliative Care Primary Diagnosis  Sepsis/Infectious Disease  Date Notified  02/17/16  Palliative Care Type  New Palliative care  Reason for referral  Clarify Goals of Care, Counsel Regarding Hospice, Psychosocial or Spiritual support, Advance Care Planning  Date  of Admission  02/17/16  Date first seen by Palliative Care  02/17/16  # of days Palliative referral response time  0 Day(s)  # of days IP prior to Palliative referral  0  Clinical Assessment  Palliative Performance Scale Score  30%  Pain Max last 24 hours  Not able to report  Pain Min Last 24 hours  Not able to report  Dyspnea Max Last 24 Hours  Not able to report  Dyspnea Min Last 24 hours  Not able to report  Nausea Max Last 24 Hours  Not able to report  Nausea Min Last 24 Hours  Not able to report  Anxiety Max Last 24 Hours  Not able to report  Anxiety Min Last 24 Hours  Not able to report  Other Max Last 24 Hours  Not able to report  Psychosocial & Spiritual Assessment  Palliative Care Outcomes  Patient/Family meeting held?  Yes  Who was at the meeting?  wife  Palliative Care Outcomes  Clarified goals of care  Patient/Family wishes: Interventions discontinued/not started   Mechanical Ventilation, BiPAP, Hemodialysis, Transfusion, Vasopressors, Trach, NIPPV, Tube feedings/TPN, PEG  Palliative Care follow-up planned  Yes, Facility      Time In: 1200 Time Out: 1320 Time Total: 80 min Greater than 50%  of this time was spent counseling and coordinating care related to the above assessment and plan.  Signed by: Dory Horn, NP   Please contact Palliative Medicine Team phone at [phone] for questions and concerns.  For individual provider: See Shea Evans

## 2016-02-17 NOTE — Progress Notes (Signed)
Paged Dr with Vital signs after bolus completed, and she said  That is okay. Will continue to monitor.  Did a cbg and it was 247. Wife remains at bedside. Safety maintained.

## 2016-02-17 NOTE — Progress Notes (Signed)
Called ED for report on patient.

## 2016-02-17 NOTE — Progress Notes (Signed)
CBG 452.  NP notified new insulin orders obtained for 2000 dose.

## 2016-02-17 NOTE — Progress Notes (Signed)
Called for report.  RN unavailable.  Will return call.

## 2016-02-17 NOTE — Progress Notes (Signed)
Patient has been admitted from ED with altered mental status.  He is from [facility]. Accompanied by his wife patient is responsive to tactile stimulation only.  He is Diabetic and CBG at 0400 was 302 and he received 11 units of novolog. He also received a NS bolus and 2g of cefepime.  Skin is dry, Feet are scaly and his scrotum is excoriated. A condom catheter was placed on him.  His bed is alarmed and he appears to be resting comfortably. Booklet given to wife and explanation of call bell and phone were explained to her.  RRT was notified of patients sepsis status.  Safety maintained.  Will continue to monitor.

## 2016-02-17 NOTE — H&P (Signed)
History and Physical    Derrick Rubio [id number] DOB: [birth year] DOA: 02/16/2016  PCP: Blanchie Serve, MD Consultants:  None Patient coming from: [facility] in [place] for the last 2 1/2 years  Chief Complaint: AMS  HPI: Derrick Rubio is a 80 y.o. male with medical history significant of advanced dementia, DM, HLD, and remote prostate CA presenting with AMS.  His wife is his historian as he is currently obtunded.  Sunday, he vomited.  Monday, it was worse and he stopped eating.  Was not waking up as usual  - sleeping more than usual.  Today, his wife was unable to wake him up.  Glucose very high.  Decreased UOP.  Has had a yeast infection.  Bowels have not moved since Sunday - had a suppository then with results.  Received Miralax x 1 last night.  Decreasded PO intake - has lost 23 pounds in a month.  Dementia since 2007 - has been fairly stable the last year but in the last month decreased PO.    ED Course: elevated glucose, elevated lactate, AKI on CKD.  Review of Systems: Unable to perform  Ambulatory Status:  Bedridden  Past Medical History:  Diagnosis Date  . CKD (chronic kidney disease)   . Dementia   . 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 Diabetes mellitus without complication ([place])   . DVT (deep venous thrombosis) ([place]) 09/16/2013  . Glaucoma   . Latent tuberculosis 09/16/2013  . Osteoarthritis 09/16/2013  . Prostate cancer ([place]) 2006   s/p seed radiation  . Pure hypercholesterolemia 10/18/2013    Past Surgical History:  Procedure Laterality Date  . COLONOSCOPY WITH PROPOFOL N/A 01/24/2014   Procedure: COLONOSCOPY WITH PROPOFOL;  Surgeon: Garlan Fair, MD;  Location: [facility] ENDOSCOPY;  Service: Endoscopy;  Laterality: N/A;  . ivc filter placement      Social History   Social History  . Marital status: Married    Spouse name: N/A  . Number of children: N/A  . Years of education: N/A   Occupational History  . Not on file.   Social  History Main Topics  . Smoking status: Never Smoker  . Smokeless tobacco: Never Used  . Alcohol use No  . Drug use: No  . Sexual activity: Not Currently   Other Topics Concern  . Not on file   Social History Narrative  . No narrative on file    No Known Allergies  No family history on file.  Prior to Admission medications   Medication Sig Start Date End Date Taking? Authorizing Provider  acetaminophen (TYLENOL) 500 MG tablet Take 500 mg by mouth every 4 (four) hours as needed for mild pain or fever.    Yes Historical Provider, MD  atorvastatin (LIPITOR) 20 MG tablet Take 20 mg by mouth daily.   Yes Historical Provider, MD  citalopram (CELEXA) 10 MG tablet Take 5 mg by mouth daily.    Yes Historical Provider, MD  insulin lispro (HUMALOG) 100 UNIT/ML injection Inject 2 Units into the skin once.   Yes Historical Provider, MD  latanoprost (XALATAN) 0.005 % ophthalmic solution Place 1 drop into both eyes at bedtime. Instill one drop in each eye every night at bedtime for glaucoma. Wait 3-5 minutes between 2 eye med   Yes Historical Provider, MD  lisinopril (PRINIVIL,ZESTRIL) 2.5 MG tablet Take 2.5 mg by mouth daily. Hold for SBP <110   Yes Historical Provider, MD  memantine (NAMENDA XR) 28 MG CP24 24 hr capsule  Take 28 mg by mouth daily.    Yes Historical Provider, MD  metFORMIN (GLUCOPHAGE) 500 MG tablet Take 500 mg by mouth 2 (two) times daily with a meal.    Yes Historical Provider, MD  nystatin cream (MYCOSTATIN) Apply 1 application topically 2 (two) times daily. Apply to scrotum after bathing   Yes Historical Provider, MD  polyethylene glycol (MIRALAX / GLYCOLAX) packet Take 17 g by mouth daily.   Yes Historical Provider, MD  saccharomyces boulardii (FLORASTOR) 250 MG capsule Take 250 mg by mouth 2 (two) times daily. For 15 days started on 02/13/16   Yes Historical Provider, MD  sennosides-docusate sodium (SENOKOT-S) 8.6-50 MG tablet Take 1 tablet by mouth 2 (two) times daily.   Yes  Historical Provider, MD  tamsulosin (FLOMAX) 0.4 MG CAPS capsule Take 0.4 mg by mouth daily.    Yes Historical Provider, MD    Physical Exam: Vitals:   02/16/16 2315 02/16/16 2330 02/17/16 0032 02/17/16 0035  BP: 99/70 (!) 89/69 97/66   Pulse: 104 95  91  Resp: 13 13 17 20   Temp:      TempSrc:      SpO2: 97% 96%  100%     General: Appears calm and comfortable and is NAD but is obtunded and did not arouse throughout evaluation Eyes: closed throughout ENT: mouth open, normal lips & tongue, somewhat dry mm Neck:  no LAD, masses or thyromegaly Cardiovascular:  RRR, no m/r/g. No LE edema.  Respiratory:  CTA bilaterally, no w/r/r. Normal respiratory effort. Abdomen:  soft, ntnd, NABS Skin:  no rash or induration seen on limited exam Musculoskeletal:  grossly normal tone BUE/BLE, good ROM, no bony abnormality Psychiatric: obtunded Neurologic: unable to evaluate  Labs on Admission: I have personally reviewed following labs and imaging studies  CBC:  Recent Labs Lab 02/16/16 2304  WBC 11.2*  HGB 16.0  HCT 48.5  MCV 94.9  PLT 0000000   Basic Metabolic Panel:  Recent Labs Lab 02/16/16 2304  NA 145  K 4.3  CL 120*  CO2 20*  GLUCOSE 425*  BUN 31*  CREATININE 1.90*  CALCIUM 9.2   GFR: Estimated Creatinine Clearance: 35.2 mL/min (by C-G formula based on SCr of 1.9 mg/dL). Liver Function Tests:  Recent Labs Lab 02/16/16 2304  AST 24  ALT 23  ALKPHOS 79  BILITOT 0.9  PROT 7.5  ALBUMIN 3.8   No results for input(s): LIPASE, AMYLASE in the last 168 hours. No results for input(s): AMMONIA in the last 168 hours. Coagulation Profile: No results for input(s): INR, PROTIME in the last 168 hours. Cardiac Enzymes: No results for input(s): CKTOTAL, CKMB, CKMBINDEX, TROPONINI in the last 168 hours. BNP (last 3 results) No results for input(s): PROBNP in the last 8760 hours. HbA1C: No results for input(s): HGBA1C in the last 72 hours. CBG:  Recent Labs Lab  02/16/16 2259 02/17/16 0039 02/17/16 0124  GLUCAP 389* 332* 337*   Lipid Profile: No results for input(s): CHOL, HDL, LDLCALC, TRIG, CHOLHDL, LDLDIRECT in the last 72 hours. Thyroid Function Tests: No results for input(s): TSH, T4TOTAL, FREET4, T3FREE, THYROIDAB in the last 72 hours. Anemia Panel: No results for input(s): VITAMINB12, FOLATE, FERRITIN, TIBC, IRON, RETICCTPCT in the last 72 hours. Urine analysis:    Component Value Date/Time   COLORURINE YELLOW 02/16/2016 2255   APPEARANCEUR TURBID (A) 02/16/2016 2255   APPEARANCEUR Clear 05/14/2014 2030   LABSPEC 1.036 (H) 02/16/2016 2255   LABSPEC 1.021 05/14/2014 2030   PHURINE 5.0  02/16/2016 2255   GLUCOSEU >1000 (A) 02/16/2016 2255   GLUCOSEU Negative 05/14/2014 2030   HGBUR MODERATE (A) 02/16/2016 2255   BILIRUBINUR NEGATIVE 02/16/2016 2255   BILIRUBINUR Negative 05/14/2014 2030   KETONESUR 15 (A) 02/16/2016 2255   PROTEINUR 30 (A) 02/16/2016 2255   NITRITE NEGATIVE 02/16/2016 2255   LEUKOCYTESUR MODERATE (A) 02/16/2016 2255   LEUKOCYTESUR Negative 05/14/2014 2030    Creatinine Clearance: Estimated Creatinine Clearance: 35.2 mL/min (by C-G formula based on SCr of 1.9 mg/dL).  Sepsis Labs: @LABRCNTIP (procalcitonin:4,lacticidven:4) )No results found for this or any previous visit (from the past 240 hour(s)).   Radiological Exams on Admission: Ct Head Wo Contrast  Result Date: 02/17/2016 CLINICAL DATA:  Altered level of consciousness. History of diabetes, dementia, and cancer. EXAM: CT HEAD WITHOUT CONTRAST TECHNIQUE: Contiguous axial images were obtained from the base of the skull through the vertex without intravenous contrast. COMPARISON:  05/20/2010 FINDINGS: Brain: Diffuse cerebral atrophy. Ventricular dilatation consistent with central atrophy. Low-attenuation changes in the deep white matter consistent with small vessel ischemia. No mass effect or midline shift. No abnormal extra-axial fluid collections. Gray-white  matter junctions are distinct. Basal cisterns are not effaced. No evidence of acute intracranial hemorrhage. Vascular: Vascular calcifications. Skull: No depressed skull fractures. Sinuses/Orbits: No acute finding. Other: No significant change since previous study. IMPRESSION: No acute intracranial abnormalities. Chronic atrophy and small vessel ischemic changes. Electronically Signed   By: Lucienne Capers M.D.   On: 02/17/2016 00:09    EKG: not done  Assessment/Plan Active Problems:   Alzheimer's dementia without behavioral disturbance   Glaucoma   Hyperlipidemia LDL goal <100   Chronic constipation   Dehydration   Diabetes mellitus type 2, uncontrolled (HCC)   Acute kidney injury superimposed on chronic kidney disease (HCC)   Goals of care, counseling/discussion   Sepsis ([place])   Acute encephalopathy   Sepsis from a urinary source -HR to108, Elevated WBC count to 11.2, with elevated lactate to 2.26 and borderline hypotension (as low as 89/69) -Sepsis protocol initiated;have ordered IVF bolus and started on Cefepime (lives in SNF and so this would be a healthcare-associated UTI) -Suspect urinary source - moderate LE, many bacteria, TNTC WBC -Blood and urine cultures pending -Will place in observation status and continue to monitor on telemetry -If patient responds quickly, he may be appropriate for discharge in the next 24-48 hours -This may also simply be a progression to terminal dementia; if his mental status is not improving after rehydration and treatment of UTI/sepsis, may need to consider Hospice -Suspect that acute encephalopathy is resulting from this issue  DM -On Glucophage -This may not be the best choice for him given his renal dysfunction -For now, will hold -Has marked hyperglycemia - will give Lantus 5 units qhs for now and also cover with SSI  AKI on CKD -Baseline creatinine was 1.3 in 4/17, currently 1.9 -Very likely prerenal azotemia from decreased PO intake  and increased requirement in setting of sepsis -Will replete and continue to follow -Hold ACE  Alzheimer's -Concern for progression of condition -He has had dementia for over 10 years now -Namenda is likely no longer efficacious as it is generally only proven to delay the time to SNF (and he is already there) -Goals of care discussed; wife is extremely reasonable, patient is DNR, and she is interested in a palliative care consult and would consider Hospice if patient does not improve  HLD -Continue Lipitor  HTN -Hold ACE due to hypotension and AKI  Constipation -Continue home meds and add prn medications -KUB ordered at wife's request  DVT prophylaxis: Lovenox Code Status: DNR - confirmed with patient/family Family Communication: Wife present throughout and is extremely knowledgeable and reasonable  Disposition Plan: Back to SNF once clinically improved Consults called: Palliative care  Admission status: Observation to telemetry    Karmen Bongo MD [facility]  If 7PM-7AM, please contact night-coverage [email or web address] Password [email or web address]  02/17/2016, 1:43 AM

## 2016-02-17 NOTE — Progress Notes (Signed)
SLP Cancellation Note  Patient Details Name: Derrick Rubio MRN: [id number] DOB: 11-15-1935   Cancelled treatment:       Reason Eval/Treat Not Completed: Fatigue/lethargy limiting ability to participate; SLP unable to arouse pt for PO. Will closely monitor for PO readiness   Arvil Chaco MA, [place] Language Pathologist    Levi Aland 02/17/2016, 8:23 AM

## 2016-02-17 NOTE — Progress Notes (Signed)
Pharmacy Antibiotic Note  Derrick Rubio is a 80 y.o. male admitted on 02/16/2016 with AMS, possible urosepsis  Pharmacy has been consulted for Cefepime dosing.  Plan: Cefepime 2 g IV now as ordered, then 1 g IV q12h  Height: 6\' 1"  (185.4 cm) Weight: 176 lb 4.8 oz (80 kg) IBW/kg (Calculated) : 79.9  Temp (24hrs), Avg:98.8 F (37.1 C), Min:97.8 F (36.6 C), Max:99.7 F (37.6 C)   Recent Labs Lab 02/16/16 2304 02/16/16 2317 02/17/16 0204  WBC 11.2*  --   --   CREATININE 1.90*  --   --   LATICACIDVEN  --  2.26* 2.00*    Estimated Creatinine Clearance: 36.2 mL/min (by C-G formula based on SCr of 1.9 mg/dL).    No Known Allergies   Caryl Pina 02/17/2016 3:54 AM

## 2016-02-17 NOTE — Evaluation (Signed)
Clinical/Bedside Swallow Evaluation Patient Details  Name: Derrick Rubio MRN: [id number] Date of Birth: Dec 06, 1935  Today's Date: 02/17/2016 Time: SLP Start Time (ACUTE ONLY): 1211 SLP Stop Time (ACUTE ONLY): 1232 SLP Time Calculation (min) (ACUTE ONLY): 21 min  Past Medical History:  Past Medical History:  Diagnosis Date  . CKD (chronic kidney disease)   . Dementia   . 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 Diabetes mellitus without complication ([place])   . DVT (deep venous thrombosis) ([place]) 09/16/2013  . Glaucoma   . Latent tuberculosis 09/16/2013  . Osteoarthritis 09/16/2013  . Prostate cancer ([place]) 2006   s/p seed radiation  . Pure hypercholesterolemia 10/18/2013   Past Surgical History:  Past Surgical History:  Procedure Laterality Date  . COLONOSCOPY WITH PROPOFOL N/A 01/24/2014   Procedure: COLONOSCOPY WITH PROPOFOL;  Surgeon: Garlan Fair, MD;  Location: [facility];  Service: Endoscopy;  Laterality: N/A;  . ivc filter placement     HPI:      Assessment / Plan / Recommendation Clinical Impression  Pt presents with a primarily cognitive based oral dysphagia. Spouse present who reports previously at SNF pt was on a puree and thin liquid diet however with drastic weight loss this past month. Pt with lethargy yet arousable for brief periods of PO trials with maximal cueing. Note intermittent oral holding of bolus and suspected delay in swallow initiation across all consistencies. Despite poor cognitive status pt appeared to exhibit adequate airway protection. No overt signs or symptoms of aspiration with any PO. Recommend resume dysphagia 1 (puree) and thin liquid diet with medicines crushed in puree. Hold PO for extreme lethargy. ST to follow up for diet tolerance.      Aspiration Risk  Mild aspiration risk;Moderate aspiration risk    Diet Recommendation   Dysphagia 1, thin liquids  Medication Administration: Crushed with puree    Other  Recommendations Oral Care  Recommendations: Oral care BID   Follow up Recommendations  Skilled Nursing facility    Frequency and Duration min 2x/week  1 week       Prognosis Prognosis for Safe Diet Advancement: Fair Barriers to Reach Goals: Severity of deficits      Swallow Study   General      Oral/Motor/Sensory Function Overall Oral Motor/Sensory Function: Generalized oral weakness   Ice Chips Ice chips: Not tested   Thin Liquid Thin Liquid: Impaired Presentation: Straw Oral Phase Impairments: Poor awareness of bolus Oral Phase Functional Implications: Prolonged oral transit;Oral holding Pharyngeal  Phase Impairments: Suspected delayed Swallow;Multiple swallows    Nectar Thick Nectar Thick Liquid: Not tested   Honey Thick Honey Thick Liquid: Not tested   Puree Puree: Impaired Presentation: Spoon Oral Phase Impairments: Reduced lingual movement/coordination Oral Phase Functional Implications: Prolonged oral transit;Oral holding Pharyngeal Phase Impairments: Suspected delayed Swallow;Multiple swallows   Solid   GO   Solid: Not tested       Arvil Chaco MA, CCC-SLP Acute Care Speech Language Pathologist    Arvil Chaco E 02/17/2016,1:29 PM

## 2016-02-17 NOTE — ED Notes (Signed)
Pt arrives via EMS from SNF with altered mental status x1 day, wife states patient has been declining all week. Reported CBG of over 400 with SNF, who gave 2 units subQ insulin and sent to ED. Pt responsive to pain, no speech or eye opening in triage.

## 2016-02-18 DIAGNOSIS — N179 Acute kidney failure, unspecified: Secondary | ICD-10-CM

## 2016-02-18 DIAGNOSIS — E1165 Type 2 diabetes mellitus with hyperglycemia: Secondary | ICD-10-CM

## 2016-02-18 DIAGNOSIS — G309 Alzheimer's disease, unspecified: Secondary | ICD-10-CM

## 2016-02-18 DIAGNOSIS — K5900 Constipation, unspecified: Secondary | ICD-10-CM

## 2016-02-18 DIAGNOSIS — E860 Dehydration: Secondary | ICD-10-CM

## 2016-02-18 DIAGNOSIS — N189 Chronic kidney disease, unspecified: Secondary | ICD-10-CM

## 2016-02-18 LAB — CBC
HEMATOCRIT: 44.6 % (ref 39.0–52.0)
Hemoglobin: 14.1 g/dL (ref 13.0–17.0)
MCH: 30.1 pg (ref 26.0–34.0)
MCHC: 31.6 g/dL (ref 30.0–36.0)
MCV: 95.1 fL (ref 78.0–100.0)
Platelets: 197 10*3/uL (ref 150–400)
RBC: 4.69 MIL/uL (ref 4.22–5.81)
RDW: 13.9 % (ref 11.5–15.5)
WBC: 8.8 10*3/uL (ref 4.0–10.5)

## 2016-02-18 LAB — GLUCOSE, CAPILLARY
GLUCOSE-CAPILLARY: 267 mg/dL — AB (ref 65–99)
Glucose-Capillary: 115 mg/dL — ABNORMAL HIGH (ref 65–99)
Glucose-Capillary: 152 mg/dL — ABNORMAL HIGH (ref 65–99)
Glucose-Capillary: 344 mg/dL — ABNORMAL HIGH (ref 65–99)
Glucose-Capillary: 275 mg/dL — ABNORMAL HIGH (ref 65–99)
Glucose-Capillary: 329 mg/dL — ABNORMAL HIGH (ref 65–99)

## 2016-02-18 LAB — URINE CULTURE

## 2016-02-18 LAB — BASIC METABOLIC PANEL
Anion gap: 6 (ref 5–15)
BUN: 18 mg/dL (ref 6–20)
CO2: 22 mmol/L (ref 22–32)
Calcium: 8.8 mg/dL — ABNORMAL LOW (ref 8.9–10.3)
Chloride: 125 mmol/L — ABNORMAL HIGH (ref 101–111)
Creatinine, Ser: 1.25 mg/dL — ABNORMAL HIGH (ref 0.61–1.24)
GFR calc Af Amer: >60 mL/min (ref >60)
GFR calc non Af Amer: 53 mL/min — ABNORMAL LOW (ref >60)
Glucose, Bld: 118 mg/dL — ABNORMAL HIGH (ref 65–99)
POTASSIUM: 3.1 mmol/L — AB (ref 3.5–5.1)
Sodium: 153 mmol/L — ABNORMAL HIGH (ref 135–145)

## 2016-02-18 MED ORDER — POTASSIUM CHLORIDE 2 MEQ/ML IV SOLN
INTRAVENOUS | Status: DC
  Administered 2016-02-18: 10:00:00 via INTRAVENOUS
  Filled 2016-02-18 (×5): qty 1000

## 2016-02-18 MED ORDER — GLUCERNA SHAKE PO LIQD
237.0000 mL | Freq: Three times a day (TID) | ORAL | Status: DC
  Administered 2016-02-18 – 2016-02-19 (×3): 237 mL via ORAL

## 2016-02-18 NOTE — Progress Notes (Addendum)
PROGRESS NOTE  Derrick Rubio  [id number] DOB: 10-31-1935 DOA: 02/16/2016 PCP: Blanchie Serve, MD Outpatient Specialists:  Subjective: Seen with wife at bedside, per wife still constipated.  Brief Narrative:  Derrick Rubio is a 80 y.o. male with medical history significant of advanced dementia, DM, HLD, and remote prostate CA presenting with AMS.  His wife is his historian as he is currently obtunded.  Sunday, he vomited.  Monday, it was worse and he stopped eating.  Was not waking up as usual  - sleeping more than usual.  Today, his wife was unable to wake him up.  Glucose very high.  Decreased UOP.  Has had a yeast infection.  Bowels have not moved since Sunday - had a suppository then with results.  Received Miralax x 1 last night.  Decreasded PO intake - has lost 23 pounds in a month.  Dementia since 2007 - has been fairly stable the last year but in the last month decreased PO.    Assessment & Plan:   Active Problems:   Alzheimer's dementia without behavioral disturbance   Glaucoma   Hyperlipidemia LDL goal <100   Chronic constipation   Dehydration   Diabetes mellitus type 2, uncontrolled (HCC)   Acute kidney injury superimposed on chronic kidney disease (HCC)   Goals of care, counseling/discussion   Sepsis ([place])   Acute encephalopathy   Palliative care encounter   Sepsis from a urinary source -HR to108, Elevated WBC count to 11.2, with elevated lactate to 2.26 and borderline hypotension (as low as 89/69) -Sepsis protocol initiated;have ordered IVF bolus andstarted on Cefepime (lives in SNF and so this would be a healthcare-associated UTI) -Suspect urinary source - moderate LE, many bacteria, TNTC WBC -Continue current IV antibiotics, wait for cultures  DM -On Glucophage -This may not be the best choice for him given his renal dysfunction -For now, will hold -Has marked hyperglycemia - will give Lantus 5 units qhs for now and also cover with SSI  AKI on  CKD -Baseline creatinine was 1.3 in 4/17, currently 1.9 -Very likely prerenal azotemia from decreased PO intake and increased requirement in setting of sepsis -This is improved after IV fluid hydration.  Alzheimer's -Concern for progression of condition -He has had dementia for over 10 years now -Namenda is likely no longer efficacious as it is generally only proven to delay the time to SNF (and he is already there) -Goals of care discussed; wife is extremely reasonable, patient is DNR, and she is interested in a palliative care consult and would consider Hospice if patient does not improve  HLD -Continue Lipitor  HTN -Hold ACE due to hypotension and AKI  Constipation -Continue home meds and add prn medications -Wife is very concerned about his constipation, and he had to be given today.  Hypernatremia/hypokalemia -This is likely secondary to dehydration, IV fluids switched to D5W with potassium. -Check BMP in a.m.   DVT prophylaxis:  Code Status: DNR Family Communication:  Disposition Plan:  Diet: DIET - DYS 1 Room service appropriate? Yes; Fluid consistency: Thin  Consultants:   None  Procedures:   None  Antimicrobials:   None   Objective: Vitals:   02/17/16 1416 02/17/16 2122 02/18/16 0424 02/18/16 0427  BP: 102/70 (!) 101/58 98/62 105/66  Pulse: 85 96 87 86  Resp: 19 18 18 18   Temp: 98.5 F (36.9 C) 97.6 F (36.4 C) 97.7 F (36.5 C) 97.7 F (36.5 C)  TempSrc: Oral Oral Oral   SpO2: 100% 97% 98%  98%  Weight:      Height:        Intake/Output Summary (Last 24 hours) at 02/18/16 1049 Last data filed at 02/18/16 0936  Gross per 24 hour  Intake             3292 ml  Output                0 ml  Net             3292 ml   Filed Weights   02/17/16 0314  Weight: 80 kg (176 lb 4.8 oz)    Examination: General exam: Appears calm and comfortable  Respiratory system: Clear to auscultation. Respiratory effort normal. Cardiovascular system: S1 & S2  heard, RRR. No JVD, murmurs, rubs, gallops or clicks. No pedal edema. Gastrointestinal system: Abdomen is nondistended, soft and nontender. No organomegaly or masses felt. Normal bowel sounds heard. Central nervous system: Alert and oriented. No focal neurological deficits. Extremities: Symmetric 5 x 5 power. Skin: No rashes, lesions or ulcers Psychiatry: Judgement and insight appear normal. Mood & affect appropriate.   Data Reviewed: I have personally reviewed following labs and imaging studies  CBC:  Recent Labs Lab 02/16/16 2304 02/17/16 0429 02/18/16 0444  WBC 11.2* 10.9* 8.8  HGB 16.0 15.1 14.1  HCT 48.5 46.8 44.6  MCV 94.9 94.7 95.1  PLT 249 220 [id number]   Basic Metabolic Panel:  Recent Labs Lab 02/16/16 2304 02/17/16 0429 02/17/16 2106 02/18/16 0444  NA 145 151*  --  153*  K 4.3 4.0  --  3.1*  CL 120* 122*  --  125*  CO2 20* 20*  --  22  GLUCOSE 425* 321* 466* 118*  BUN 31* 27*  --  18  CREATININE 1.90* 1.64*  --  1.25*  CALCIUM 9.2 9.3  --  8.8*   GFR: Estimated Creatinine Clearance: 55 mL/min (by C-G formula based on SCr of 1.25 mg/dL). Liver Function Tests:  Recent Labs Lab 02/16/16 2304  AST 24  ALT 23  ALKPHOS 79  BILITOT 0.9  PROT 7.5  ALBUMIN 3.8   No results for input(s): LIPASE, AMYLASE in the last 168 hours. No results for input(s): AMMONIA in the last 168 hours. Coagulation Profile:  Recent Labs Lab 02/17/16 0429  INR 1.17   Cardiac Enzymes: No results for input(s): CKTOTAL, CKMB, CKMBINDEX, TROPONINI in the last 168 hours. BNP (last 3 results) No results for input(s): PROBNP in the last 8760 hours. HbA1C: No results for input(s): HGBA1C in the last 72 hours. CBG:  Recent Labs Lab 02/17/16 1157 02/17/16 2013 02/18/16 0026 02/18/16 0421 02/18/16 0811  GLUCAP 217* 452* 267* 115* 152*   Lipid Profile: No results for input(s): CHOL, HDL, LDLCALC, TRIG, CHOLHDL, LDLDIRECT in the last 72 hours. Thyroid Function Tests: No results  for input(s): TSH, T4TOTAL, FREET4, T3FREE, THYROIDAB in the last 72 hours. Anemia Panel: No results for input(s): VITAMINB12, FOLATE, FERRITIN, TIBC, IRON, RETICCTPCT in the last 72 hours. Urine analysis:    Component Value Date/Time   COLORURINE YELLOW 02/16/2016 2255   APPEARANCEUR TURBID (A) 02/16/2016 2255   APPEARANCEUR Clear 05/14/2014 2030   LABSPEC 1.036 (H) 02/16/2016 2255   LABSPEC 1.021 05/14/2014 2030   PHURINE 5.0 02/16/2016 2255   GLUCOSEU >1000 (A) 02/16/2016 2255   GLUCOSEU Negative 05/14/2014 2030   HGBUR MODERATE (A) 02/16/2016 [place] NEGATIVE 02/16/2016 2255   BILIRUBINUR Negative 05/14/2014 2030   KETONESUR 15 (A) 02/16/2016 2255  PROTEINUR 30 (A) 02/16/2016 2255   NITRITE NEGATIVE 02/16/2016 2255   LEUKOCYTESUR MODERATE (A) 02/16/2016 2255   LEUKOCYTESUR Negative 05/14/2014 2030   Sepsis Labs: @LABRCNTIP (procalcitonin:4,lacticidven:4)  ) Recent Results (from the past 240 hour(s))  MRSA PCR Screening     Status: None   Collection Time: 02/17/16  3:48 AM  Result Value Ref Range Status   MRSA by PCR NEGATIVE NEGATIVE Final    Comment:        The GeneXpert MRSA Assay (FDA approved for NASAL specimens only), is one component of a comprehensive MRSA colonization surveillance program. It is not intended to diagnose MRSA infection nor to guide or monitor treatment for MRSA infections.      Invalid input(s): PROCALCITONIN, LACTICACIDVEN   Radiology Studies: Ct Head Wo Contrast  Result Date: 02/17/2016 CLINICAL DATA:  Altered level of consciousness. History of diabetes, dementia, and cancer. EXAM: CT HEAD WITHOUT CONTRAST TECHNIQUE: Contiguous axial images were obtained from the base of the skull through the vertex without intravenous contrast. COMPARISON:  05/20/2010 FINDINGS: Brain: Diffuse cerebral atrophy. Ventricular dilatation consistent with central atrophy. Low-attenuation changes in the deep white matter consistent with small vessel  ischemia. No mass effect or midline shift. No abnormal extra-axial fluid collections. Gray-white matter junctions are distinct. Basal cisterns are not effaced. No evidence of acute intracranial hemorrhage. Vascular: Vascular calcifications. Skull: No depressed skull fractures. Sinuses/Orbits: No acute finding. Other: No significant change since previous study. IMPRESSION: No acute intracranial abnormalities. Chronic atrophy and small vessel ischemic changes. Electronically Signed   By: Lucienne Capers M.D.   On: 02/17/2016 00:09   Dg Abd Portable 1v  Result Date: 02/17/2016 CLINICAL DATA:  Constipation. EXAM: PORTABLE ABDOMEN - 1 VIEW COMPARISON:  None. FINDINGS: There is moderate stool in the transverse colon, descending colon, sigmoid colon and rectum. No all air distended small bowel loops to suggest obstruction. The soft tissue shadows are maintained. An IVC filter is noted. The bony structures are intact. Degenerative changes noted in the lower lumbar spine. IMPRESSION: Moderate stool throughout the colon suggesting constipation. No findings for small bowel obstruction or free air. Electronically Signed   By: Marijo Sanes M.D.   On: 02/17/2016 07:06        Scheduled Meds: . atorvastatin  20 mg Oral Daily  . ceFEPime (MAXIPIME) IV  2 g Intravenous Q24H  . citalopram  5 mg Oral Daily  . enoxaparin (LOVENOX) injection  40 mg Subcutaneous Daily  . insulin aspart  0-15 Units Subcutaneous Q4H  . insulin glargine  5 Units Subcutaneous QHS  . latanoprost  1 drop Both Eyes QHS  . memantine  28 mg Oral Daily  . nystatin cream  1 application Topical BID  . polyethylene glycol  17 g Oral Daily  . saccharomyces boulardii  250 mg Oral BID  . senna-docusate  1 tablet Oral BID  . sodium chloride flush  3 mL Intravenous Q12H  . tamsulosin  0.4 mg Oral Daily   Continuous Infusions: . dextrose 5 % 1,000 mL with potassium chloride 40 mEq infusion 100 mL/hr at 02/18/16 1020     LOS: 1 day    Time  spent: 35 minutes    Excell Neyland A, MD [facility] Pager [phone]  If 7PM-7AM, please contact night-coverage [email or web address] Password [email or web address] 02/18/2016, 10:49 AM

## 2016-02-19 DIAGNOSIS — Z515 Encounter for palliative care: Secondary | ICD-10-CM

## 2016-02-19 DIAGNOSIS — Z7189 Other specified counseling: Secondary | ICD-10-CM

## 2016-02-19 DIAGNOSIS — E785 Hyperlipidemia, unspecified: Secondary | ICD-10-CM

## 2016-02-19 LAB — BASIC METABOLIC PANEL
Anion gap: 5 (ref 5–15)
BUN: 14 mg/dL (ref 6–20)
CHLORIDE: 118 mmol/L — AB (ref 101–111)
CO2: 21 mmol/L — AB (ref 22–32)
Calcium: 8.6 mg/dL — ABNORMAL LOW (ref 8.9–10.3)
Creatinine, Ser: 1.26 mg/dL — ABNORMAL HIGH (ref 0.61–1.24)
GFR calc Af Amer: >60 mL/min (ref >60)
GFR calc non Af Amer: 53 mL/min — ABNORMAL LOW (ref >60)
GLUCOSE: 322 mg/dL — AB (ref 65–99)
POTASSIUM: 3.6 mmol/L (ref 3.5–5.1)
Sodium: 144 mmol/L (ref 135–145)

## 2016-02-19 LAB — CBC
HEMATOCRIT: 39.2 % (ref 39.0–52.0)
Hemoglobin: 12.6 g/dL — ABNORMAL LOW (ref 13.0–17.0)
MCH: 30.1 pg (ref 26.0–34.0)
MCHC: 32.1 g/dL (ref 30.0–36.0)
MCV: 93.8 fL (ref 78.0–100.0)
Platelets: 171 10*3/uL (ref 150–400)
RBC: 4.18 MIL/uL — ABNORMAL LOW (ref 4.22–5.81)
RDW: 13.8 % (ref 11.5–15.5)
WBC: 7.5 10*3/uL (ref 4.0–10.5)

## 2016-02-19 LAB — GLUCOSE, CAPILLARY
GLUCOSE-CAPILLARY: 306 mg/dL — AB (ref 65–99)
GLUCOSE-CAPILLARY: 376 mg/dL — AB (ref 65–99)
Glucose-Capillary: 278 mg/dL — ABNORMAL HIGH (ref 65–99)
Glucose-Capillary: 198 mg/dL — ABNORMAL HIGH (ref 65–99)
Glucose-Capillary: 353 mg/dL — ABNORMAL HIGH (ref 65–99)
Glucose-Capillary: 405 mg/dL — ABNORMAL HIGH (ref 65–99)

## 2016-02-19 MED ORDER — INSULIN ASPART 100 UNIT/ML ~~LOC~~ SOLN
7.0000 [IU] | Freq: Once | SUBCUTANEOUS | Status: AC
Start: 2016-02-19 — End: 2016-02-19
  Administered 2016-02-19: 7 [IU] via SUBCUTANEOUS

## 2016-02-19 MED ORDER — DEXTROSE 5 % IV SOLN
2.0000 g | Freq: Two times a day (BID) | INTRAVENOUS | Status: DC
  Administered 2016-02-19 – 2016-02-20 (×2): 2 g via INTRAVENOUS
  Filled 2016-02-19 (×3): qty 2

## 2016-02-19 NOTE — NC FL2 (Signed)
[place] MEDICAID FL2 LEVEL OF CARE SCREENING TOOL     IDENTIFICATION  Patient Name: Derrick Rubio Birthdate: [birth year] Sex: male Admission Date (Current Location): 02/16/2016  [facility] and [place] Number:  Herbalist and Address:  [facility] [place], [address], [place], [place] [address]      Provider Number: [id number]  Attending Physician Name and Address:  Verlee Monte, MD  Relative Name and Phone Number:  Katharine Look, spouse, [phone]    Current Level of Care: Hospital Recommended Level of Care: [place] Prior Approval Number:    Date Approved/Denied:   PASRR Number: [id number]  Discharge Plan: SNF    Current Diagnoses: Patient Active Problem List   Diagnosis Date Noted  . Dehydration 02/17/2016  . Diabetes mellitus type 2, uncontrolled ([place]) 02/17/2016  . Acute kidney injury superimposed on chronic kidney disease ([place]) 02/17/2016  . Goals of care, counseling/discussion 02/17/2016  . Sepsis ([place]) 02/17/2016  . Acute encephalopathy 02/17/2016  . Palliative care encounter   . Major depression, chronic ([place]) 12/08/2015  . Chronic constipation 11/09/2015  . Type 2 diabetes mellitus with diabetic nephropathy, without long-term current use of insulin ([place]) 10/11/2015  . Microalbuminuria due to type 2 diabetes mellitus ([place]) 09/07/2015  . Alzheimer's disease 07/14/2015  . Type 2 diabetes mellitus with diabetic peripheral angiopathy without gangrene, without long-term current use of insulin ([place]) 07/14/2015  . Depression with anxiety 02/08/2015  . BPH without obstruction/lower urinary tract symptoms 10/24/2014  . Hyperlipidemia LDL goal <100 10/24/2014  . Type 2 diabetes with peripheral circulatory disorder, controlled ([place]) 10/24/2014  . Rectal bleeding 12/08/2013  . Glaucoma 11/02/2013  . 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 Alzheimer's dementia without behavioral disturbance 09/16/2013  . Osteoarthritis  09/16/2013    Orientation RESPIRATION BLADDER Height & Weight     Self  Normal Incontinent Weight: 80 kg (176 lb 4.8 oz) Height:  6\' 1"  (185.4 cm)  BEHAVIORAL SYMPTOMS/MOOD NEUROLOGICAL BOWEL NUTRITION STATUS      Incontinent Diet (Please see DC Summary)  AMBULATORY STATUS COMMUNICATION OF NEEDS Skin   Extensive Assist Verbally Normal                       Personal Care Assistance Level of Assistance  Bathing, Feeding, Dressing Bathing Assistance: Maximum assistance Feeding assistance: Maximum assistance Dressing Assistance: Maximum assistance     Functional Limitations Info             SPECIAL CARE FACTORS FREQUENCY                       Contractures      Additional Factors Info  Code Status, Allergies, Insulin Sliding Scale Code Status Info: DNR Allergies Info: NKA   Insulin Sliding Scale Info: insulin aspart (novoLOG) injection 0-15 Units;insulin glargine (LANTUS) injection 5 Units       Current Medications (02/19/2016):  This is the current hospital active medication list Current Facility-Administered Medications  Medication Dose Route Frequency Provider Last Rate Last Dose  . acetaminophen (TYLENOL) tablet 650 mg  650 mg Oral Q6H PRN Karmen Bongo, MD       Or  . acetaminophen (TYLENOL) suppository 650 mg  650 mg Rectal Q6H PRN Karmen Bongo, MD   650 mg at 02/17/16 1040  . atorvastatin (LIPITOR) tablet 20 mg  20 mg Oral Daily Karmen Bongo, MD   20 mg at 02/19/16 0905  . bisacodyl (DULCOLAX) suppository 10 mg  10 mg Rectal Daily PRN Karmen Bongo, MD   10 mg at 02/17/16 0824  . ceFEPIme (MAXIPIME) 2 g in dextrose 5 % 50 mL IVPB  2 g Intravenous Q12H Delane Ginger, RPH      . citalopram (CELEXA) tablet 5 mg  5 mg Oral Daily Karmen Bongo, MD   5 mg at 02/19/16 0906  . enoxaparin (LOVENOX) injection 40 mg  40 mg Subcutaneous Daily Karmen Bongo, MD   40 mg at 02/19/16 0906  . feeding supplement (GLUCERNA SHAKE) (GLUCERNA SHAKE) liquid 237 mL   237 mL Oral TID BM Verlee Monte, MD   237 mL at 02/19/16 1350  . insulin aspart (novoLOG) injection 0-15 Units  0-15 Units Subcutaneous Q4H Karmen Bongo, MD   11 Units at 02/19/16 1350  . insulin glargine (LANTUS) injection 5 Units  5 Units Subcutaneous QHS Karmen Bongo, MD   5 Units at 02/18/16 2227  . latanoprost (XALATAN) 0.005 % ophthalmic solution 1 drop  1 drop Both Eyes QHS Karmen Bongo, MD   1 drop at 02/18/16 2227  . memantine (NAMENDA XR) 24 hr capsule 28 mg  28 mg Oral Daily Karmen Bongo, MD   28 mg at 02/19/16 0906  . nystatin cream (MYCOSTATIN) 1 application  1 application Topical BID Karmen Bongo, MD   1 application at [id number] [phone]  . ondansetron (ZOFRAN) tablet 4 mg  4 mg Oral Q6H PRN Karmen Bongo, MD       Or  . ondansetron [facility]) injection 4 mg  4 mg Intravenous Q6H PRN Karmen Bongo, MD      . polyethylene glycol (MIRALAX / GLYCOLAX) packet 17 g  17 g Oral Daily Karmen Bongo, MD   17 g at 02/19/16 0907  . saccharomyces boulardii (FLORASTOR) capsule 250 mg  250 mg Oral BID Karmen Bongo, MD   250 mg at 02/19/16 0905  . senna-docusate (Senokot-S) tablet 1 tablet  1 tablet Oral BID Karmen Bongo, MD   1 tablet at 02/19/16 0905  . sodium chloride flush (NS) 0.9 % injection 3 mL  3 mL Intravenous Q12H Karmen Bongo, MD   3 mL at 02/18/16 0058  . tamsulosin (FLOMAX) capsule 0.4 mg  0.4 mg Oral Daily Karmen Bongo, MD   0.4 mg at 02/19/16 [id number]     Discharge Medications: Please see discharge summary for a list of discharge medications.  Relevant Imaging Results:  Relevant Lab Results:   Additional Information SSN: [place]  Norman [place], [place]

## 2016-02-19 NOTE — Progress Notes (Signed)
PROGRESS NOTE  Derrick Rubio  [id number] DOB: April 20, 1936 DOA: 02/16/2016 PCP: Blanchie Serve, MD Outpatient Specialists:  Subjective: Seen with his wife at bedside, appears to be more interactive, open his eyes and answers simple questions.  Brief Narrative:  Derrick Rubio is a 80 y.o. male with medical history significant of advanced dementia, DM, HLD, and remote prostate CA presenting with AMS.  His wife is his historian as he is currently obtunded.  Sunday, he vomited.  Monday, it was worse and he stopped eating.  Was not waking up as usual  - sleeping more than usual.  Today, his wife was unable to wake him up.  Glucose very high.  Decreased UOP.  Has had a yeast infection.  Bowels have not moved since Sunday - had a suppository then with results.  Received Miralax x 1 last night.  Decreasded PO intake - has lost 23 pounds in a month.  Dementia since 2007 - has been fairly stable the last year but in the last month decreased PO.    Assessment & Plan:   Active Problems:   Alzheimer's dementia without behavioral disturbance   Glaucoma   Hyperlipidemia LDL goal <100   Chronic constipation   Dehydration   Diabetes mellitus type 2, uncontrolled (HCC)   Acute kidney injury superimposed on chronic kidney disease (HCC)   Goals of care, counseling/discussion   Sepsis ([place])   Acute encephalopathy   Palliative care encounter   Sepsis from a urinary source -HR to108, Elevated WBC count to 11.2, with elevated lactate to 2.26 and borderline hypotension (as low as 89/69) -Sepsis protocol initiated;have ordered IVF bolus andstarted on Cefepime (lives in SNF and so this would be a healthcare-associated UTI) -Suspect urinary source - moderate LE, many bacteria, TNTC WBC -On IV cefepime, culture did not show a loss,? Obtained after initiation of antibiotics.  DM -On Glucophage -This may not be the best choice for him given his renal dysfunction -For now, will hold -Has marked  hyperglycemia - will give Lantus 5 units qhs for now and also cover with SSI  AKI on CKD -Baseline creatinine was 1.3 in 4/17, currently 1.9 -Very likely prerenal azotemia from decreased PO intake and increased requirement in setting of sepsis -This is improved after IV fluid hydration.  Alzheimer's -Concern for progression of condition -He has had dementia for over 10 years now -Namenda is likely no longer efficacious as it is generally only proven to delay the time to SNF (and he is already there) -Goals of care discussed; wife is extremely reasonable, patient is DNR, and she is interested in a palliative care consult and would consider Hospice if patient does not improve  HLD -Continue Lipitor  HTN -Hold ACE due to hypotension and AKI  Constipation -Continue home meds and add prn medications -Wife is very concerned about his constipation, and he had to be given today.  Hypernatremia/hypokalemia -This is likely secondary to dehydration, IV fluids switched to D5W with potassium. -Check BMP in a.m.   DVT prophylaxis: Lovenox Code Status: DNR Family Communication: Plan discussed with the wife at bedside. Disposition Plan: We'll discharge back to the nursing home in a.m. Diet: DIET - DYS 1 Room service appropriate? Yes; Fluid consistency: Thin  Consultants:   None  Procedures:   None  Antimicrobials:   None   Objective: Vitals:   02/18/16 0427 02/18/16 1325 02/18/16 2151 02/19/16 0533  BP: 105/66 (!) 153/96 105/68 (!) 101/58  Pulse: 86 (!) 101 83 83  Resp: 18  18 18 18   Temp: 97.7 F (36.5 C) 98.4 F (36.9 C) 97.6 F (36.4 C) 97.8 F (36.6 C)  TempSrc:  Oral Oral Oral  SpO2: 98% 99% 99% 100%  Weight:      Height:        Intake/Output Summary (Last 24 hours) at 02/19/16 1013 Last data filed at 02/18/16 1700  Gross per 24 hour  Intake             1000 ml  Output                0 ml  Net             1000 ml   Filed Weights   02/17/16 0314    Weight: 80 kg (176 lb 4.8 oz)    Examination: General exam: Appears calm and comfortable  Respiratory system: Clear to auscultation. Respiratory effort normal. Cardiovascular system: S1 & S2 heard, RRR. No JVD, murmurs, rubs, gallops or clicks. No pedal edema. Gastrointestinal system: Abdomen is nondistended, soft and nontender. No organomegaly or masses felt. Normal bowel sounds heard. Central nervous system: Alert and oriented. No focal neurological deficits. Extremities: Symmetric 5 x 5 power. Skin: No rashes, lesions or ulcers Psychiatry: Judgement and insight appear normal. Mood & affect appropriate.   Data Reviewed: I have personally reviewed following labs and imaging studies  CBC:  Recent Labs Lab 02/16/16 2304 02/17/16 0429 02/18/16 0444 02/19/16 0320  WBC 11.2* 10.9* 8.8 7.5  HGB 16.0 15.1 14.1 12.6*  HCT 48.5 46.8 44.6 39.2  MCV 94.9 94.7 95.1 93.8  PLT 249 220 197 [id number]   Basic Metabolic Panel:  Recent Labs Lab 02/16/16 2304 02/17/16 0429 02/17/16 2106 02/18/16 0444 02/19/16 0320  NA 145 151*  --  153* 144  K 4.3 4.0  --  3.1* 3.6  CL 120* 122*  --  125* 118*  CO2 20* 20*  --  22 21*  GLUCOSE 425* 321* 466* 118* 322*  BUN 31* 27*  --  18 14  CREATININE 1.90* 1.64*  --  1.25* 1.26*  CALCIUM 9.2 9.3  --  8.8* 8.6*   GFR: Estimated Creatinine Clearance: 54.6 mL/min (by C-G formula based on SCr of 1.26 mg/dL). Liver Function Tests:  Recent Labs Lab 02/16/16 2304  AST 24  ALT 23  ALKPHOS 79  BILITOT 0.9  PROT 7.5  ALBUMIN 3.8   No results for input(s): LIPASE, AMYLASE in the last 168 hours. No results for input(s): AMMONIA in the last 168 hours. Coagulation Profile:  Recent Labs Lab 02/17/16 0429  INR 1.17   Cardiac Enzymes: No results for input(s): CKTOTAL, CKMB, CKMBINDEX, TROPONINI in the last 168 hours. BNP (last 3 results) No results for input(s): PROBNP in the last 8760 hours. HbA1C: No results for input(s): HGBA1C in the last 72  hours. CBG:  Recent Labs Lab 02/18/16 1702 02/18/16 2010 02/19/16 0020 02/19/16 0359 02/19/16 0741  GLUCAP 275* 329* 376* 278* 198*   Lipid Profile: No results for input(s): CHOL, HDL, LDLCALC, TRIG, CHOLHDL, LDLDIRECT in the last 72 hours. Thyroid Function Tests: No results for input(s): TSH, T4TOTAL, FREET4, T3FREE, THYROIDAB in the last 72 hours. Anemia Panel: No results for input(s): VITAMINB12, FOLATE, FERRITIN, TIBC, IRON, RETICCTPCT in the last 72 hours. Urine analysis:    Component Value Date/Time   COLORURINE YELLOW 02/16/2016 2255   APPEARANCEUR TURBID (A) 02/16/2016 2255   APPEARANCEUR Clear 05/14/2014 2030   LABSPEC 1.036 (H) 02/16/2016 2255   LABSPEC  1.021 05/14/2014 2030   PHURINE 5.0 02/16/2016 2255   GLUCOSEU >1000 (A) 02/16/2016 2255   GLUCOSEU Negative 05/14/2014 2030   HGBUR MODERATE (A) 02/16/2016 2255   BILIRUBINUR NEGATIVE 02/16/2016 2255   BILIRUBINUR Negative 05/14/2014 2030   KETONESUR 15 (A) 02/16/2016 2255   PROTEINUR 30 (A) 02/16/2016 2255   NITRITE NEGATIVE 02/16/2016 2255   LEUKOCYTESUR MODERATE (A) 02/16/2016 2255   LEUKOCYTESUR Negative 05/14/2014 2030   Sepsis Labs: @LABRCNTIP (procalcitonin:4,lacticidven:4)  ) Recent Results (from the past 240 hour(s))  Culture, blood (x 2)     Status: None (Preliminary result)   Collection Time: 02/16/16 11:00 PM  Result Value Ref Range Status   Specimen Description BLOOD RIGHT FOREARM  Final   Special Requests BOTTLES DRAWN AEROBIC AND ANAEROBIC 5ML  Final   Culture NO GROWTH 1 DAY  Final   Report Status PENDING  Incomplete  MRSA PCR Screening     Status: None   Collection Time: 02/17/16  3:48 AM  Result Value Ref Range Status   MRSA by PCR NEGATIVE NEGATIVE Final    Comment:        The GeneXpert MRSA Assay (FDA approved for NASAL specimens only), is one component of a comprehensive MRSA colonization surveillance program. It is not intended to diagnose MRSA infection nor to guide  or monitor treatment for MRSA infections.   Culture, blood (x 2)     Status: None (Preliminary result)   Collection Time: 02/17/16  4:32 AM  Result Value Ref Range Status   Specimen Description BLOOD LEFT FOREARM  Final   Special Requests AEROBIC BOTTLE ONLY 10ML  Final   Culture NO GROWTH 1 DAY  Final   Report Status PENDING  Incomplete  Culture, Urine     Status: Abnormal   Collection Time: 02/17/16  7:38 AM  Result Value Ref Range Status   Specimen Description URINE, RANDOM  Final   Special Requests   Final    From the urine specimen, already collected in the lab   Culture <10,000 COLONIES/mL INSIGNIFICANT GROWTH (A)  Final   Report Status 02/18/2016 FINAL  Final     Invalid input(s): PROCALCITONIN, LACTICACIDVEN   Radiology Studies: No results found.      Scheduled Meds: . atorvastatin  20 mg Oral Daily  . ceFEPime (MAXIPIME) IV  2 g Intravenous Q12H  . citalopram  5 mg Oral Daily  . enoxaparin (LOVENOX) injection  40 mg Subcutaneous Daily  . feeding supplement (GLUCERNA SHAKE)  237 mL Oral TID BM  . insulin aspart  0-15 Units Subcutaneous Q4H  . insulin glargine  5 Units Subcutaneous QHS  . latanoprost  1 drop Both Eyes QHS  . memantine  28 mg Oral Daily  . nystatin cream  1 application Topical BID  . polyethylene glycol  17 g Oral Daily  . saccharomyces boulardii  250 mg Oral BID  . senna-docusate  1 tablet Oral BID  . sodium chloride flush  3 mL Intravenous Q12H  . tamsulosin  0.4 mg Oral Daily   Continuous Infusions:     LOS: 2 days    Time spent: 35 minutes    Jerime Arif A, MD [facility] Pager [phone]  If 7PM-7AM, please contact night-coverage [email or web address] Password [email or web address] 02/19/2016, 10:13 AM

## 2016-02-19 NOTE — Progress Notes (Addendum)
Text-paged IM resident on-call regarding pt's CBG 405 at 20:15. Putting in STAT lab verification now.  Will continue to monitor. Pt in no apparent distress at this time.  Henreitta Leber, RN 9:27 PM 02/19/16   Addendum: IM textpaged in error. [facility] textpaged now.  9:31 PM 02/19/16

## 2016-02-19 NOTE — Progress Notes (Signed)
SLP Cancellation Note  Patient Details Name: Prabhjot Beger MRN: [id number] DOB: June 29, 1935   Cancelled treatment:       Reason Eval/Treat Not Completed: Fatigue/lethargy limiting ability to participate  Gabriel Rainwater [place], [place] [phone]  Gabriel Rainwater [place] 02/19/2016, 11:56 AM

## 2016-02-19 NOTE — Clinical Social Work Note (Signed)
Clinical Social Work Assessment  Patient Details  Name: Derrick Rubio MRN: [id number] Date of Birth: Sep 06, 1935  Date of referral:  02/19/16               Reason for consult:  Facility Placement                Permission sought to share information with:  Facility Sport and exercise psychologist, Family Supports Permission granted to share information::  No (Patient is disoriented; completed assessment with spouse)  Name::     Derrick Rubio  Agency::  [facility]  Relationship::  Spouse  Contact Information:  [phone]  Housing/Transportation Living arrangements for the past 2 months:  [place] of Information:  Spouse Patient Interpreter Needed:  None Criminal Activity/Legal Involvement Pertinent to Current Situation/Hospitalization:  No - Comment as needed Significant Relationships:  Spouse Lives with:  Facility Resident Do you feel safe going back to the place where you live?  Yes Need for family participation in patient care:  Yes (Comment)  Care giving concerns:  CSW received consult regarding discharge planning. Patient is disoriented. Patient is from [facility], per patient's spouse and will return there at discharge. CSW to continue to follow for discharge needs.    Social Worker assessment / plan:  CSW spoke to patient's wife regarding patient return to [facility]. CSW reached out to [place] [facility]missions to confirm acceptance.   Employment status:  Retired Forensic scientist:  Other (Comment Required) PT Recommendations:  Not assessed at this time Information / Referral to community resources:  [place]  Patient/Family's Response to care:  Patient's spouse expressed understanding of discharge process.   Patient/Family's Understanding of and Emotional Response to Diagnosis, Current Treatment, and Prognosis: No questions/concerns at this time, though spouse is hopeful for patient to get better.  Emotional Assessment Appearance:   Appears stated age Attitude/Demeanor/Rapport:  Unable to Assess Affect (typically observed):  Unable to Assess Orientation:  Oriented to Self Alcohol / Substance use:  Not Applicable Psych involvement (Current and /or in the community):  No (Comment)  Discharge Needs  Concerns to be addressed:  Care Coordination Readmission within the last 30 days:  No Current discharge risk:  None Barriers to Discharge:  Continued Medical Work up   [employer], [place] 02/19/2016, 3:15 PM

## 2016-02-19 NOTE — Progress Notes (Signed)
Inpatient Diabetes Program Recommendations  AACE/ADA: New Consensus Statement on Inpatient Glycemic Control (2015)  Target Ranges:  Prepandial:   less than 140 mg/dL      Peak postprandial:   less than 180 mg/dL (1-2 hours)      Critically ill patients:  140 - 180 mg/dL   Results for DRISTON, ADERHOLT (MRN [id number]) as of 02/19/2016 14:43  Ref. Range 02/18/2016 20:10 02/19/2016 00:20 02/19/2016 03:59 02/19/2016 07:41 02/19/2016 12:11  Glucose-Capillary Latest Ref Range: 65 - 99 mg/dL 329 (H) 376 (H) 278 (H) 198 (H) 306 (H)    Review of Glycemic Control  Inpatient Diabetes Program Recommendations:  Please consider increase in Lantus to 10 units daily and add meal coverage of 4 units tid if eats 50% or takes supplement.   Thank you, Nani Gasser. Sabastian Raimondi, RN, MSN, CDE Inpatient Glycemic Control Team Team Pager [phone] (8am-5pm) 02/19/2016 2:44 PM

## 2016-02-19 NOTE — Progress Notes (Signed)
Initial Nutrition Assessment  DOCUMENTATION CODES:   Not applicable  INTERVENTION:    Continue Glucerna Shake po TID, each supplement provides 220 kcal and 10 grams of protein  NUTRITION DIAGNOSIS:   Increased nutrient needs related to chronic illness as evidenced by estimated needs  GOAL:   Patient will meet greater than or equal to 90% of their needs  MONITOR:   PO intake, Supplement acceptance, Labs, Weight trends, I & O's  REASON FOR ASSESSMENT:   Malnutrition Screening Tool  ASSESSMENT:   80 y.o. male with medical history significant of advanced dementia, DM, HLD, and remote prostate CA presenting with AMS.  His wife is his historian as he is currently obtunded.  Sunday, he vomited.  Monday, it was worse and he stopped eating.  Was not waking up as usual  - sleeping more than usual.  Today, his wife was unable to wake him up.  Glucose very high.  Decreased UOP.  Has had a yeast infection.  Bowels have not moved since Sunday - had a suppository then with results.  Received Miralax x 1 last night.  Decreasded PO intake - has lost 23 pounds in a month.  Dementia since 2007 - has been fairly stable the last year but in the last month decreased PO.    RD spoke with family members at bedside. Pt sleeping soundly with mittens on >> resides at [facility]. Pt's wife reports pt ate well this AM and drank a Glucerna Shake. He's had a 23 lb weight loss in the last month >> severe for time frame. RD unable to complete Nutrition Focused Physical Exam at this time.  RD suspects malnutrition, however, unable to identify at this time.  Diet Order:  DIET - DYS 1 Room service appropriate? Yes; Fluid consistency: Thin  Skin:  Reviewed, no issues  Last BM:  9/3  Height:   Ht Readings from Last 1 Encounters:  02/17/16 6\' 1"  (1.854 m)    Weight:   Wt Readings from Last 1 Encounters:  02/17/16 176 lb 4.8 oz (80 kg)    Ideal Body Weight:  83.6 kg  BMI:  Body mass index is  23.26 kg/m.  Estimated Nutritional Needs:   Kcal:  1800-2000  Protein:  90-100 gm  Fluid:  1.8-2.0 L  EDUCATION NEEDS:   No education needs identified at this time  Arthur Holms, RD, LDN Pager #: [phone] After-Hours Pager #: [phone]

## 2016-02-20 LAB — BASIC METABOLIC PANEL
Anion gap: 7 (ref 5–15)
BUN: 13 mg/dL (ref 6–20)
CALCIUM: 8.9 mg/dL (ref 8.9–10.3)
CO2: 22 mmol/L (ref 22–32)
CREATININE: 1.14 mg/dL (ref 0.61–1.24)
Chloride: 116 mmol/L — ABNORMAL HIGH (ref 101–111)
GFR, EST NON AFRICAN AMERICAN: 60 mL/min — AB (ref >60)
Glucose, Bld: 169 mg/dL — ABNORMAL HIGH (ref 65–99)
Potassium: 3.5 mmol/L (ref 3.5–5.1)
SODIUM: 145 mmol/L (ref 135–145)

## 2016-02-20 LAB — GLUCOSE, CAPILLARY
GLUCOSE-CAPILLARY: 289 mg/dL — AB (ref 65–99)
GLUCOSE-CAPILLARY: 282 mg/dL — AB (ref 65–99)
Glucose-Capillary: 237 mg/dL — ABNORMAL HIGH (ref 65–99)
Glucose-Capillary: 163 mg/dL — ABNORMAL HIGH (ref 65–99)

## 2016-02-20 MED ORDER — CEFUROXIME AXETIL 500 MG PO TABS: 500.0000 mg | ORAL_TABLET | Freq: Two times a day (BID) | ORAL | 0 refills | Status: DC

## 2016-02-20 MED ORDER — METFORMIN HCL 1000 MG PO TABS: 1000.0000 mg | ORAL_TABLET | Freq: Two times a day (BID) | ORAL | Status: DC

## 2016-02-20 NOTE — Care Management Important Message (Signed)
Important Message  Patient Details  Name: Derrick Rubio MRN: [id number] Date of Birth: [birth year]   Medicare Important Message Given:  Yes    Miyo Aina Abena 02/20/2016, 5:08 PM

## 2016-02-20 NOTE — Clinical Social Work Note (Signed)
Patient is medically stable for discharge and will return to [facility] skilled nursing facility, transported by ambulance. Discharge clinicals have been transmitted to facility. Visited patient's room and spoke with wife regarding discharge, (patient was asleep) and ambulance transport to facility. Wife expressed appreciation for CSW's visit and information provided.   Demarious Kapur Givens, MSW, LCSW Licensed Clinical Social Worker [place] [phone]

## 2016-02-20 NOTE — Discharge Summary (Signed)
Physician Discharge Summary  Derrick Rubio [id number] DOB: September 14, 1935 DOA: 02/16/2016  PCP: Derrick Serve, MD  Admit date: 02/16/2016 Discharge date: 02/20/2016  Admitted From: [facility]  Recommendations for Outpatient Follow-up:  1. Follow up with PCP in 1-2 weeks 2. Please obtain BMP/CBC in one week 3. Continue Ceftin for 5 more days, change metformin to 1000 mg twice a day. 4. Palliative care to follow in the skilled nursing facility  Home Health: N/A Equipment/Devices: None  Discharge Condition: Stable CODE STATUS: DNR/DNI Diet recommendation: Carb Modified, dysphagia 1 with thin liquids  Brief/Interim Summary: Derrick Witherspoonis a 80 y.o.malewith medical history significant of advanced dementia, DM, HLD, and remote prostate CA presenting with AMS. His wife is his historian as he is currently obtunded. Sunday, he vomited. Monday, it was worse and he stopped eating. Was not waking up as usual - sleeping more than usual. Today, his wife was unable to wake him up. Glucose very high. Decreased UOP. Has had a yeast infection. Bowels have not moved since Sunday - had a suppository then with results. Received Miralax x 1 last night. Decreasded PO intake - has lost 23 pounds in a month. Dementia since 2007 - has been fairly stable the last year but in the last month decreased PO.   Discharge Diagnoses:  Active Problems:   Alzheimer's dementia without behavioral disturbance   Glaucoma   Hyperlipidemia LDL goal <100   Chronic constipation   Dehydration   Diabetes mellitus type 2, uncontrolled (HCC)   Acute kidney injury superimposed on chronic kidney disease (HCC)   Goals of care, counseling/discussion   Sepsis ([place])   Acute encephalopathy   Palliative care encounter   Sepsis from a urinary source -HR to108, Elevated WBC count to 11.2, with elevated lactate to 2.26and borderline hypotension (as low as 89/69) -Sepsis  protocol initiated;have orderedIVF bolus andstarted on Cefepime (lives in SNF and so this would be a healthcare-associated UTI) -Suspect urinary source - moderate LE, many bacteria, TNTC WBC -Sepsis physiology resolved after aggressive hydration with IV fluids and IV antibiotics.  UTI -Presented with urinalysis consistent with UTI with many bacteria and TNTC WBC. -Started on cefepime in the emergency department I had to send the culture the next day. -The culture came back with insignificant growth, I think this is obtained after antibiotics initiated. -I'll discharge on Ceftin for 5 more days.  DM type II, uncontrolled. -Hemoglobin A1c was 7.8 in April 2017 -Blood sugar was uncontrolled while he was in the hospital because he was placed on D5W infusion. -On discharge metformin increased to 1000 mg twice a day -He was also receiving SSI.  AKI  -Baseline creatinine was 1.3 in 4/17, currently 1.9 -Very likely prerenal azotemia from decreased PO intake and increased requirement in setting of sepsis -This is resolved with IV fluid hydration, creatinine is 1.1 on the day of discharge. -It is unclear to me if he does have CKD but his creatinine is normal today, does not suggest CKD.  Alzheimer's -Concern for progression of condition -He has had dementia for over 10 years now -Namenda is likely no longer efficacious as it is generally only proven to delay the time to SNF (and he is already there) -Goals of care discussed; wife is extremely reasonable, patient is DNR, and she is interested in a palliative care consult.  Dysphagia -Diet dysphagia 1 with full liquids, carbohydrate modified diet  HLD -Continue Lipitor  HTN -Hold ACE due to hypotension and AKI, restarted  on discharge.  Constipation -Continue home meds and add prn medications -This is resolved after an enema.  Hypernatremia/hypokalemia -This is likely secondary to dehydration from poor oral intake, IV fluids  switched to D5W with potassium. -This is resolved.  Palliative medicine team recommendation  DNR/DNI  No PEG tube  Goal is not to return to the hospital and treatment for further infections  Would only want antibiotics or IV fluids if they can to be administered in skilled nursing facility  [facility] treatment plan and course of antibiotics to for this hospitalization then will discharge back to SNF with comfort care.  MOST form completed and on patient's chart to be transported with him when he is ready for discharge back to SNF.  Discharge Instructions  Discharge Instructions    Diet - low sodium heart healthy    Complete by:  As directed   Increase activity slowly    Complete by:  As directed       Medication List    TAKE these medications   acetaminophen 500 MG tablet Commonly known as:  TYLENOL Take 500 mg by mouth every 4 (four) hours as needed for mild pain or fever.   atorvastatin 20 MG tablet Commonly known as:  LIPITOR Take 20 mg by mouth daily.   cefUROXime 500 MG tablet Commonly known as:  CEFTIN Take 1 tablet (500 mg total) by mouth 2 (two) times daily with a meal.   citalopram 10 MG tablet Commonly known as:  CELEXA Take 5 mg by mouth daily.   latanoprost 0.005 % ophthalmic solution Commonly known as:  XALATAN Place 1 drop into both eyes at bedtime. Instill one drop in each eye every night at bedtime for glaucoma. Wait 3-5 minutes between 2 eye med   lisinopril 2.5 MG tablet Commonly known as:  PRINIVIL,ZESTRIL Take 2.5 mg by mouth daily. Hold for SBP <110   metFORMIN 1000 MG tablet Commonly known as:  GLUCOPHAGE Take 1 tablet (1,000 mg total) by mouth 2 (two) times daily with a meal. What changed:  medication strength  how much to take   NAMENDA XR 28 MG Cp24 24 hr capsule Generic drug:  memantine Take 28 mg by mouth daily.   nystatin cream Commonly known as:  MYCOSTATIN Apply 1 application topically 2 (two) times daily. Apply to scrotum  after bathing   polyethylene glycol packet Commonly known as:  MIRALAX / GLYCOLAX Take 17 g by mouth daily.   saccharomyces boulardii 250 MG capsule Commonly known as:  FLORASTOR Take 250 mg by mouth 2 (two) times daily. For 15 days started on 02/13/16   sennosides-docusate sodium 8.6-50 MG tablet Commonly known as:  SENOKOT-S Take 1 tablet by mouth 2 (two) times daily.   tamsulosin 0.4 MG Caps capsule Commonly known as:  FLOMAX Take 0.4 mg by mouth daily.       No Known Allergies  Consultations: None  Procedures/Studies: Ct Head Wo Contrast  Result Date: 02/17/2016 CLINICAL DATA:  Altered level of consciousness. History of diabetes, dementia, and cancer. EXAM: CT HEAD WITHOUT CONTRAST TECHNIQUE: Contiguous axial images were obtained from the base of the skull through the vertex without intravenous contrast. COMPARISON:  05/20/2010 FINDINGS: Brain: Diffuse cerebral atrophy. Ventricular dilatation consistent with central atrophy. Low-attenuation changes in the deep white matter consistent with small vessel ischemia. No mass effect or midline shift. No abnormal extra-axial fluid collections. Gray-white matter junctions are distinct. Basal cisterns are not effaced. No evidence of acute intracranial hemorrhage. Vascular: Vascular calcifications.  Skull: No depressed skull fractures. Sinuses/Orbits: No acute finding. Other: No significant change since previous study. IMPRESSION: No acute intracranial abnormalities. Chronic atrophy and small vessel ischemic changes. Electronically Signed   By: Lucienne Capers M.D.   On: 02/17/2016 00:09   Dg Abd Portable 1v  Result Date: 02/17/2016 CLINICAL DATA:  Constipation. EXAM: PORTABLE ABDOMEN - 1 VIEW COMPARISON:  None. FINDINGS: There is moderate stool in the transverse colon, descending colon, sigmoid colon and rectum. No all air distended small bowel loops to suggest obstruction. The soft tissue shadows are maintained. An IVC filter is noted. The  bony structures are intact. Degenerative changes noted in the lower lumbar spine. IMPRESSION: Moderate stool throughout the colon suggesting constipation. No findings for small bowel obstruction or free air. Electronically Signed   By: Marijo Sanes M.D.   On: 02/17/2016 07:06    (Echo, Carotid, EGD, Colonoscopy, ERCP)    Subjective:   Discharge Exam: Vitals:   02/19/16 2203 02/20/16 0644  BP: 101/67 96/67  Pulse: 92 83  Resp: 16 14  Temp: 99.3 F (37.4 C) 98.5 F (36.9 C)   Vitals:   02/19/16 0533 02/19/16 1330 02/19/16 2203 02/20/16 0644  BP: (!) 101/58 109/60 101/67 96/67  Pulse: 83 88 92 83  Resp: 18 18 16 14   Temp: 97.8 F (36.6 C) 98.2 F (36.8 C) 99.3 F (37.4 C) 98.5 F (36.9 C)  TempSrc: Oral Oral Oral Oral  SpO2: 100% 99% 97% 99%  Weight:      Height:        General: Pt is alert, awake, not in acute distress Cardiovascular: RRR, S1/S2 +, no rubs, no gallops Respiratory: CTA bilaterally, no wheezing, no rhonchi Abdominal: Soft, NT, ND, bowel sounds + Extremities: no edema, no cyanosis    The results of significant diagnostics from this hospitalization (including imaging, microbiology, ancillary and laboratory) are listed below for reference.     Microbiology: Recent Results (from the past 240 hour(s))  Culture, blood (x 2)     Status: None (Preliminary result)   Collection Time: 02/16/16 11:00 PM  Result Value Ref Range Status   Specimen Description BLOOD RIGHT FOREARM  Final   Special Requests BOTTLES DRAWN AEROBIC AND ANAEROBIC 5ML  Final   Culture NO GROWTH 2 DAYS  Final   Report Status PENDING  Incomplete  MRSA PCR Screening     Status: None   Collection Time: 02/17/16  3:48 AM  Result Value Ref Range Status   MRSA by PCR NEGATIVE NEGATIVE Final    Comment:        The GeneXpert MRSA Assay (FDA approved for NASAL specimens only), is one component of a comprehensive MRSA colonization surveillance program. It is not intended to diagnose  MRSA infection nor to guide or monitor treatment for MRSA infections.   Culture, blood (x 2)     Status: None (Preliminary result)   Collection Time: 02/17/16  4:32 AM  Result Value Ref Range Status   Specimen Description BLOOD LEFT FOREARM  Final   Special Requests AEROBIC BOTTLE ONLY 10ML  Final   Culture NO GROWTH 2 DAYS  Final   Report Status PENDING  Incomplete  Culture, Urine     Status: Abnormal   Collection Time: 02/17/16  7:38 AM  Result Value Ref Range Status   Specimen Description URINE, RANDOM  Final   Special Requests   Final    From the urine specimen, already collected in the lab   Culture <10,000 COLONIES/mL  INSIGNIFICANT GROWTH (A)  Final   Report Status 02/18/2016 FINAL  Final     Labs: BNP (last 3 results) No results for input(s): BNP in the last 8760 hours. Basic Metabolic Panel:  Recent Labs Lab 02/16/16 2304 02/17/16 0429 02/17/16 2106 02/18/16 0444 02/19/16 0320 02/20/16 0631  NA 145 151*  --  153* 144 145  K 4.3 4.0  --  3.1* 3.6 3.5  CL 120* 122*  --  125* 118* 116*  CO2 20* 20*  --  22 21* 22  GLUCOSE 425* 321* 466* 118* 322* 169*  BUN 31* 27*  --  18 14 13   CREATININE 1.90* 1.64*  --  1.25* 1.26* 1.14  CALCIUM 9.2 9.3  --  8.8* 8.6* 8.9   Liver Function Tests:  Recent Labs Lab 02/16/16 2304  AST 24  ALT 23  ALKPHOS 79  BILITOT 0.9  PROT 7.5  ALBUMIN 3.8   No results for input(s): LIPASE, AMYLASE in the last 168 hours. No results for input(s): AMMONIA in the last 168 hours. CBC:  Recent Labs Lab 02/16/16 2304 02/17/16 0429 02/18/16 0444 02/19/16 0320  WBC 11.2* 10.9* 8.8 7.5  HGB 16.0 15.1 14.1 12.6*  HCT 48.5 46.8 44.6 39.2  MCV 94.9 94.7 95.1 93.8  PLT 249 220 197 171   Cardiac Enzymes: No results for input(s): CKTOTAL, CKMB, CKMBINDEX, TROPONINI in the last 168 hours. BNP: Invalid input(s): POCBNP CBG:  Recent Labs Lab 02/19/16 1600 02/19/16 2015 02/20/16 0111 02/20/16 0356 02/20/16 0807  GLUCAP 353*  405* 289* 237* 163*   D-Dimer No results for input(s): DDIMER in the last 72 hours. Hgb A1c No results for input(s): HGBA1C in the last 72 hours. Lipid Profile No results for input(s): CHOL, HDL, LDLCALC, TRIG, CHOLHDL, LDLDIRECT in the last 72 hours. Thyroid function studies No results for input(s): TSH, T4TOTAL, T3FREE, THYROIDAB in the last 72 hours.  Invalid input(s): FREET3 Anemia work up No results for input(s): VITAMINB12, FOLATE, FERRITIN, TIBC, IRON, RETICCTPCT in the last 72 hours. Urinalysis    Component Value Date/Time   COLORURINE YELLOW 02/16/2016 2255   APPEARANCEUR TURBID (A) 02/16/2016 2255   APPEARANCEUR Clear 05/14/2014 2030   LABSPEC 1.036 (H) 02/16/2016 2255   LABSPEC 1.021 05/14/2014 2030   PHURINE 5.0 02/16/2016 2255   GLUCOSEU >1000 (A) 02/16/2016 2255   GLUCOSEU Negative 05/14/2014 2030   HGBUR MODERATE (A) 02/16/2016 2255   BILIRUBINUR NEGATIVE 02/16/2016 2255   BILIRUBINUR Negative 05/14/2014 2030   KETONESUR 15 (A) 02/16/2016 2255   PROTEINUR 30 (A) 02/16/2016 2255   NITRITE NEGATIVE 02/16/2016 2255   LEUKOCYTESUR MODERATE (A) 02/16/2016 2255   LEUKOCYTESUR Negative 05/14/2014 2030   Sepsis Labs Invalid input(s): PROCALCITONIN,  WBC,  LACTICIDVEN Microbiology Recent Results (from the past 240 hour(s))  Culture, blood (x 2)     Status: None (Preliminary result)   Collection Time: 02/16/16 11:00 PM  Result Value Ref Range Status   Specimen Description BLOOD RIGHT FOREARM  Final   Special Requests BOTTLES DRAWN AEROBIC AND ANAEROBIC 5ML  Final   Culture NO GROWTH 2 DAYS  Final   Report Status PENDING  Incomplete  MRSA PCR Screening     Status: None   Collection Time: 02/17/16  3:48 AM  Result Value Ref Range Status   MRSA by PCR NEGATIVE NEGATIVE Final    Comment:        The GeneXpert MRSA Assay (FDA approved for NASAL specimens only), is one component of a comprehensive MRSA  colonization surveillance program. It is not intended to  diagnose MRSA infection nor to guide or monitor treatment for MRSA infections.   Culture, blood (x 2)     Status: None (Preliminary result)   Collection Time: 02/17/16  4:32 AM  Result Value Ref Range Status   Specimen Description BLOOD LEFT FOREARM  Final   Special Requests AEROBIC BOTTLE ONLY 10ML  Final   Culture NO GROWTH 2 DAYS  Final   Report Status PENDING  Incomplete  Culture, Urine     Status: Abnormal   Collection Time: 02/17/16  7:38 AM  Result Value Ref Range Status   Specimen Description URINE, RANDOM  Final   Special Requests   Final    From the urine specimen, already collected in the lab   Culture <10,000 COLONIES/mL INSIGNIFICANT GROWTH (A)  Final   Report Status 02/18/2016 FINAL  Final     Time coordinating discharge: Over 30 minutes  SIGNED:   Birdie Hopes, MD  [facility] 02/20/2016, 10:04 AM Pager   If 7PM-7AM, please contact night-coverage [email or web address] Password [email or web address]

## 2016-02-20 NOTE — Care Management Note (Signed)
Case Management Note  Patient Details  Name: Derrick Rubio MRN: [id number] Date of Birth: Jul 18, 1935  Subjective/Objective:      Admitted with AMS, hx of advanced dementia, DM, HLD, and remote prostate CA. From SNF, is married.         PCP: Blanchie Serve Action/Plan: Plan is to d/c to SNF today.  Expected Discharge Date:  02/20/16               Expected Discharge Plan:  [facility] )  In-House Referral:  Clinical Social Work  Discharge planning Services  CM Consult  Status of Service:  Completed, signed off  If discussed at [employer] of Stay Meetings, dates discussed:    Additional Comments:  Sharin Mons, RN 02/20/2016, 10:08 AM

## 2016-02-20 NOTE — Progress Notes (Signed)
Pt discharged from [facility] via [place] going to [facility]. IV d/c'd, patient belongings returned to pt's wife, report had been given by day RN to receiving facility during day shift.

## 2016-02-20 NOTE — Progress Notes (Signed)
Speech Language Pathology Treatment: Dysphagia  Patient Details Name: Jeydan Barner MRN: [id number] DOB: [birth year] Today's Date: 02/20/2016 Time: 0920-0930 SLP Time Calculation (min) (ACUTE ONLY): 10 min  Assessment / Plan / Recommendation Clinical Impression  SLP visited pt while wife was assisting RN in giving meds crushed in puree. Pt was alert and following wife's tactile and verbal cues. Wife is clearly an experienced, skilled caregiver and used techniques successfully to feed pt. SLP reinforced cueing methods and suggested more upright posture while in bed (pt usually up tt chair for meals) and demonstrated tilting bed and helped wife reposition herself more comfortably. Overall pt tolerating diet and back to baseline. No SLP f/u needed. Marland Kitchen    HPI HPI: Pt is a 80 y.o.malewith medical history significant of advanced dementia, DM, HLD, and remote prostate CA presenting with AMS. Pt is LTC resident at [facility].       SLP Plan  All goals met     Recommendations  Diet recommendations: Dysphagia 1 (puree);Thin liquid Liquids provided via: Cup Medication Administration: Crushed with puree Supervision: Trained caregiver to feed patient Compensations: Slow rate;Small sips/bites;Minimize environmental distractions Postural Changes and/or Swallow Maneuvers: Seated upright 90 degrees             Plan: All goals met     GO                Eshawn Coor, Katherene Ponto 02/20/2016, 11:41 AM

## 2016-02-21 ENCOUNTER — Telehealth: Payer: Self-pay | Admitting: Internal Medicine

## 2016-02-21 LAB — GLUCOSE, CAPILLARY: Glucose-Capillary: 288 mg/dL — ABNORMAL HIGH (ref 65–99)

## 2016-02-21 NOTE — Telephone Encounter (Signed)
Possible re-admission to facility  - This is a patient you were seeing at **[facility] fu is needed IF patient was re-admitted to facility upon discharge. Hospital discharge **02/20/16**

## 2016-02-22 ENCOUNTER — Non-Acute Institutional Stay (SKILLED_NURSING_FACILITY): Payer: Medicare Other | Admitting: Internal Medicine

## 2016-02-22 ENCOUNTER — Encounter: Payer: Self-pay | Admitting: Internal Medicine

## 2016-02-22 DIAGNOSIS — Z8546 Personal history of malignant neoplasm of prostate: Secondary | ICD-10-CM | POA: Diagnosis not present

## 2016-02-22 DIAGNOSIS — F329 Major depressive disorder, single episode, unspecified: Secondary | ICD-10-CM | POA: Diagnosis not present

## 2016-02-22 DIAGNOSIS — G309 Alzheimer's disease, unspecified: Secondary | ICD-10-CM | POA: Diagnosis not present

## 2016-02-22 DIAGNOSIS — F0280 Dementia in other diseases classified elsewhere without behavioral disturbance: Secondary | ICD-10-CM | POA: Diagnosis not present

## 2016-02-22 DIAGNOSIS — N390 Urinary tract infection, site not specified: Secondary | ICD-10-CM

## 2016-02-22 DIAGNOSIS — E785 Hyperlipidemia, unspecified: Secondary | ICD-10-CM

## 2016-02-22 DIAGNOSIS — E1121 Type 2 diabetes mellitus with diabetic nephropathy: Secondary | ICD-10-CM

## 2016-02-22 DIAGNOSIS — R531 Weakness: Secondary | ICD-10-CM

## 2016-02-22 DIAGNOSIS — K5900 Constipation, unspecified: Secondary | ICD-10-CM

## 2016-02-22 DIAGNOSIS — F0393 Unspecified dementia, unspecified severity, with mood disturbance: Secondary | ICD-10-CM

## 2016-02-22 LAB — CULTURE, BLOOD (ROUTINE X 2)
CULTURE: NO GROWTH
Culture: NO GROWTH

## 2016-02-22 NOTE — Progress Notes (Signed)
LOCATION: [facility]  PCP: Blanchie Serve, MD   Code Status: DNR  Goals of care: Advanced Directive information Advanced Directives 02/18/2016  Does patient have an advance directive? No  Type of Advance Directive [place]  Does patient want to make changes to advanced directive? No - Patient declined  Copy of advanced directive(s) in chart? -  Would patient like information on creating an advanced directive? No - patient declined information       Extended Emergency Contact Information Primary Emergency Contact: Roedel,Sandra Address: [place], [place] Phone: [phone] Work Phone: [phone] Mobile Phone: [phone] Relation: Spouse Secondary Emergency Contact: Anderle,William Address: [address]          [place], [place] [address] Johnnette Litter of [place] Phone: [phone] Mobile Phone: [phone] Relation: Son   No Known Allergies  Chief Complaint  Patient presents with  . Readmit To SNF    Readmission     HPI:  Patient is a 80 y.o. male seen today for long term care post hospital admission from 02/16/16-02/20/16 with altered mental state and poor po intake. He was noted to have urosepsis. He received iv fluids and antibiotics. He also had acute renal failure that responded well to iv fluids. He has PMH of Alzheimer's dementia, hyperlipidemia, Hypertension, dm type 2 among others. He is seen in his room today.   Review of Systems: Limited ROS due to advance dementia. Constitutional: Negative for fever  HENT: Negative for headache Eyes: Negative for eye pain Respiratory: Negative for cough, shortness of breath Cardiovascular: Negative for chest pain Gastrointestinal: Negative for vomiting, abdominal pain Musculoskeletal: Negative for fall Skin: Negative for rash.     Past Medical History:  Diagnosis Date  . CKD (chronic kidney disease)   . Dementia   .  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 Diabetes mellitus without complication ([place])   . DVT (deep venous thrombosis) ([place]) 09/16/2013  . Glaucoma   . Latent tuberculosis 09/16/2013  . Osteoarthritis 09/16/2013  . Prostate cancer ([place]) 2006   s/p seed radiation  . Pure hypercholesterolemia 10/18/2013   Past Surgical History:  Procedure Laterality Date  . COLONOSCOPY WITH PROPOFOL N/A 01/24/2014   Procedure: COLONOSCOPY WITH PROPOFOL;  Surgeon: Garlan Fair, MD;  Location: [facility];  Service: Endoscopy;  Laterality: N/A;  . ivc filter placement     Social History:   reports that he has never smoked. He has never used smokeless tobacco. He reports that he does not drink alcohol or use drugs.  No family history on file.  Medications:   Medication List       Accurate as of 02/22/16  2:23 PM. Always use your most recent med list.          acetaminophen 500 MG tablet Commonly known as:  TYLENOL Take 500 mg by mouth every 4 (four) hours as needed for mild pain or fever.   atorvastatin 20 MG tablet Commonly known as:  LIPITOR Take 30 mg by mouth at bedtime.   cefUROXime 500 MG tablet Commonly known as:  CEFTIN Take 1 tablet (500 mg total) by mouth 2 (two) times daily with a meal.   citalopram 10 MG tablet Commonly known as:  CELEXA Take 5 mg by mouth daily.   latanoprost 0.005 % ophthalmic solution Commonly known as:  XALATAN Place 1 drop into both eyes at bedtime. Instill one drop  in each eye every night at bedtime for glaucoma. Wait 3-5 minutes between 2 eye med   lisinopril 2.5 MG tablet Commonly known as:  PRINIVIL,ZESTRIL Take 2.5 mg by mouth daily. Hold for SBP <110   metFORMIN 1000 MG tablet Commonly known as:  GLUCOPHAGE Take 1 tablet (1,000 mg total) by mouth 2 (two) times daily with a meal.   NAMENDA XR 28 MG Cp24 24 hr capsule Generic drug:  memantine Take 28 mg by mouth daily.   nystatin cream Commonly known as:  MYCOSTATIN Apply 1 application  topically 2 (two) times daily. Apply to scrotum after bathing   polyethylene glycol packet Commonly known as:  MIRALAX / GLYCOLAX Take 17 g by mouth daily.   saccharomyces boulardii 250 MG capsule Commonly known as:  FLORASTOR Take 250 mg by mouth 2 (two) times daily. For 15 days started on 02/13/16   sennosides-docusate sodium 8.6-50 MG tablet Commonly known as:  SENOKOT-S Take 1 tablet by mouth 2 (two) times daily.   tamsulosin 0.4 MG Caps capsule Commonly known as:  FLOMAX Take 0.4 mg by mouth daily.       Immunizations: Immunization History  Administered Date(s) Administered  . Influenza-Unspecified 03/29/2014, 04/14/2015  . PPD Test 12/15/2013, 02/21/2015  . Pneumococcal-Unspecified 04/06/2014     Physical Exam:  Vitals:   02/22/16 1154  BP: 112/71  Pulse: 89  Resp: 18  Temp: 98.1 F (36.7 C)  TempSrc: Oral  Weight: 180 lb 3.2 oz (81.7 kg)  Height: 6' (1.829 m)   Body mass index is 24.44 kg/m.  General- elderly male in no acute distress Head- atraumatic, normocephalic Eyes- no pallor, no icterus Mouth- edentulous, moist mucus membrane Neck- no lymphadenopathy Cardiovascular- normal s1,s2, no murmur Respiratory- bilateral clear to auscultation, no wheeze, no rhonchi, no crackles Abdomen- bowel sounds present, soft, non tender Musculoskeletal- able to move all 4 extremities, no leg edema, lower extremity weakness present Neurological- alert and oriented to self only    Labs reviewed: Basic Metabolic Panel:  Recent Labs  02/18/16 0444 02/19/16 0320 02/20/16 0631  NA 153* 144 145  K 3.1* 3.6 3.5  CL 125* 118* 116*  CO2 22 21* 22  GLUCOSE 118* 322* 169*  BUN 18 14 13   CREATININE 1.25* 1.26* 1.14  CALCIUM 8.8* 8.6* 8.9   Liver Function Tests:  Recent Labs  06/13/15 09/25/15 02/16/16 2304  AST 17 20 24   ALT 12 22 23   ALKPHOS 89 90 79  BILITOT  --   --  0.9  PROT  --   --  7.5  ALBUMIN  --   --  3.8   No results for input(s): LIPASE,  AMYLASE in the last 8760 hours. No results for input(s): AMMONIA in the last 8760 hours. CBC:  Recent Labs  02/17/16 0429 02/18/16 0444 02/19/16 0320  WBC 10.9* 8.8 7.5  HGB 15.1 14.1 12.6*  HCT 46.8 44.6 39.2  MCV 94.7 95.1 93.8  PLT 220 197 171   Cardiac Enzymes: No results for input(s): CKTOTAL, CKMB, CKMBINDEX, TROPONINI in the last 8760 hours. BNP: Invalid input(s): POCBNP CBG:  Recent Labs  02/20/16 0807 02/20/16 1223 02/20/16 1719  GLUCAP 163* 282* 288*    Radiological Exams: Ct Head Wo Contrast  Result Date: 02/17/2016 CLINICAL DATA:  Altered level of consciousness. History of diabetes, dementia, and cancer. EXAM: CT HEAD WITHOUT CONTRAST TECHNIQUE: Contiguous axial images were obtained from the base of the skull through the vertex without intravenous contrast. COMPARISON:  05/20/2010 FINDINGS:  Brain: Diffuse cerebral atrophy. Ventricular dilatation consistent with central atrophy. Low-attenuation changes in the deep white matter consistent with small vessel ischemia. No mass effect or midline shift. No abnormal extra-axial fluid collections. Gray-white matter junctions are distinct. Basal cisterns are not effaced. No evidence of acute intracranial hemorrhage. Vascular: Vascular calcifications. Skull: No depressed skull fractures. Sinuses/Orbits: No acute finding. Other: No significant change since previous study. IMPRESSION: No acute intracranial abnormalities. Chronic atrophy and small vessel ischemic changes. Electronically Signed   By: Lucienne Capers M.D.   On: 02/17/2016 00:09   Dg Abd Portable 1v  Result Date: 02/17/2016 CLINICAL DATA:  Constipation. EXAM: PORTABLE ABDOMEN - 1 VIEW COMPARISON:  None. FINDINGS: There is moderate stool in the transverse colon, descending colon, sigmoid colon and rectum. No all air distended small bowel loops to suggest obstruction. The soft tissue shadows are maintained. An IVC filter is noted. The bony structures are intact.  Degenerative changes noted in the lower lumbar spine. IMPRESSION: Moderate stool throughout the colon suggesting constipation. No findings for small bowel obstruction or free air. Electronically Signed   By: Marijo Sanes M.D.   On: 02/17/2016 07:06    Assessment/Plan  Generalized weakness From deconditioning from recent infection along with his medical co-morbidities. Will have patient work with PT/OT as tolerated to regain strength and restore function.  Fall precautions are in place.  UTI Hydration to be maintained. Continue and complete course of ceftin 500 mg q12h until 02/25/16  Dementia Advanced. Under total care. Has supportive family. Continue namenda xr 28 mg daily  Chronic depression Stable, continue celexa 5 mg daily  Diabetes mellitus with renal impairment Lab Results  Component Value Date   HGBA1C 7.8 09/18/2015   Continue lisinopril for renal protection. Continue metformin 1000 mg bid. Check a1c  Constipation Continue his miralax with senokot s  Hyperlipidemia Continue lipitor  History of prostate cancer Continue flomax. He is s/p seed radiation   Goals of care: long term care   Labs/tests ordered: cbc, bmp, a1c  Family/ staff Communication: reviewed care plan with patient and nursing supervisor  Reviewed goals of care discussion from hospital- DNR, no further hospitalization, no peg tube, ok with iv fluid and antibiotics, MOST form was filled out, to place a copy in his chart   Blanchie Serve, MD Internal Medicine [place], [place] [address] Cell Phone (Monday-Friday 8 am - 5 pm): [phone] On Call: [phone] and follow prompts after 5 pm and on weekends Office Phone: [phone] Office Fax: [phone]

## 2016-02-28 LAB — BASIC METABOLIC PANEL
BUN: 30 mg/dL — AB (ref 4–21)
Creatinine: 1.6 mg/dL — AB (ref 0.6–1.3)
Glucose: 717 mg/dL
POTASSIUM: 4.9 mmol/L (ref 3.4–5.3)
SODIUM: 143 mmol/L (ref 137–147)

## 2016-02-28 LAB — CBC AND DIFFERENTIAL
HEMATOCRIT: 46 % (ref 41–53)
HEMOGLOBIN: 15.4 g/dL (ref 13.5–17.5)
Platelets: 310 10*3/uL (ref 150–399)
WBC: 9.8 10^3/mL

## 2016-02-29 ENCOUNTER — Emergency Department (HOSPITAL_COMMUNITY): Payer: Medicare Other

## 2016-02-29 ENCOUNTER — Encounter (HOSPITAL_COMMUNITY): Payer: Self-pay | Admitting: Emergency Medicine

## 2016-02-29 ENCOUNTER — Inpatient Hospital Stay (HOSPITAL_COMMUNITY)
Admission: EM | Admit: 2016-02-29 | Discharge: 2016-03-04 | DRG: 871 | Disposition: A | Discharge status: Skilled Nursing Facility | Payer: Medicare Other | Attending: Family Medicine | Admitting: Family Medicine

## 2016-02-29 DIAGNOSIS — D638 Anemia in other chronic diseases classified elsewhere: Secondary | ICD-10-CM | POA: Diagnosis present

## 2016-02-29 DIAGNOSIS — R1310 Dysphagia, unspecified: Secondary | ICD-10-CM | POA: Diagnosis present

## 2016-02-29 DIAGNOSIS — Z7401 Bed confinement status: Secondary | ICD-10-CM | POA: Diagnosis not present

## 2016-02-29 DIAGNOSIS — Z87440 Personal history of urinary (tract) infections: Secondary | ICD-10-CM

## 2016-02-29 DIAGNOSIS — E43 Unspecified severe protein-calorie malnutrition: Secondary | ICD-10-CM | POA: Diagnosis present

## 2016-02-29 DIAGNOSIS — E785 Hyperlipidemia, unspecified: Secondary | ICD-10-CM | POA: Diagnosis present

## 2016-02-29 DIAGNOSIS — IMO0002 Reserved for concepts with insufficient information to code with codable children: Secondary | ICD-10-CM | POA: Diagnosis present

## 2016-02-29 DIAGNOSIS — E1122 Type 2 diabetes mellitus with diabetic chronic kidney disease: Secondary | ICD-10-CM | POA: Diagnosis present

## 2016-02-29 DIAGNOSIS — R571 Hypovolemic shock: Secondary | ICD-10-CM | POA: Diagnosis present

## 2016-02-29 DIAGNOSIS — I9589 Other hypotension: Secondary | ICD-10-CM | POA: Diagnosis present

## 2016-02-29 DIAGNOSIS — I499 Cardiac arrhythmia, unspecified: Secondary | ICD-10-CM | POA: Diagnosis present

## 2016-02-29 DIAGNOSIS — R739 Hyperglycemia, unspecified: Secondary | ICD-10-CM

## 2016-02-29 DIAGNOSIS — E870 Hyperosmolality and hypernatremia: Secondary | ICD-10-CM | POA: Diagnosis present

## 2016-02-29 DIAGNOSIS — Z993 Dependence on wheelchair: Secondary | ICD-10-CM | POA: Diagnosis not present

## 2016-02-29 DIAGNOSIS — A419 Sepsis, unspecified organism: Principal | ICD-10-CM | POA: Diagnosis present

## 2016-02-29 DIAGNOSIS — E1165 Type 2 diabetes mellitus with hyperglycemia: Secondary | ICD-10-CM | POA: Diagnosis present

## 2016-02-29 DIAGNOSIS — G9341 Metabolic encephalopathy: Secondary | ICD-10-CM | POA: Diagnosis present

## 2016-02-29 DIAGNOSIS — G9340 Encephalopathy, unspecified: Secondary | ICD-10-CM | POA: Diagnosis not present

## 2016-02-29 DIAGNOSIS — F0280 Dementia in other diseases classified elsewhere without behavioral disturbance: Secondary | ICD-10-CM | POA: Diagnosis present

## 2016-02-29 DIAGNOSIS — N189 Chronic kidney disease, unspecified: Secondary | ICD-10-CM | POA: Diagnosis present

## 2016-02-29 DIAGNOSIS — R6520 Severe sepsis without septic shock: Secondary | ICD-10-CM | POA: Diagnosis present

## 2016-02-29 DIAGNOSIS — Z8546 Personal history of malignant neoplasm of prostate: Secondary | ICD-10-CM | POA: Diagnosis not present

## 2016-02-29 DIAGNOSIS — F329 Major depressive disorder, single episode, unspecified: Secondary | ICD-10-CM | POA: Diagnosis present

## 2016-02-29 DIAGNOSIS — G309 Alzheimer's disease, unspecified: Secondary | ICD-10-CM | POA: Diagnosis present

## 2016-02-29 DIAGNOSIS — R4182 Altered mental status, unspecified: Secondary | ICD-10-CM | POA: Diagnosis not present

## 2016-02-29 DIAGNOSIS — N179 Acute kidney failure, unspecified: Secondary | ICD-10-CM | POA: Diagnosis present

## 2016-02-29 DIAGNOSIS — H409 Unspecified glaucoma: Secondary | ICD-10-CM | POA: Diagnosis present

## 2016-02-29 DIAGNOSIS — E860 Dehydration: Secondary | ICD-10-CM | POA: Diagnosis not present

## 2016-02-29 DIAGNOSIS — R6521 Severe sepsis with septic shock: Secondary | ICD-10-CM

## 2016-02-29 DIAGNOSIS — Z7984 Long term (current) use of oral hypoglycemic drugs: Secondary | ICD-10-CM

## 2016-02-29 DIAGNOSIS — Z66 Do not resuscitate: Secondary | ICD-10-CM | POA: Diagnosis present

## 2016-02-29 DIAGNOSIS — F0390 Unspecified dementia without behavioral disturbance: Secondary | ICD-10-CM | POA: Diagnosis not present

## 2016-02-29 DIAGNOSIS — Z79899 Other long term (current) drug therapy: Secondary | ICD-10-CM

## 2016-02-29 LAB — BLOOD GAS, ARTERIAL
ACID-BASE DEFICIT: 4.6 mmol/L — AB (ref 0.0–2.0)
BICARBONATE: 19 mmol/L — AB (ref 20.0–28.0)
Drawn by: 308601
FIO2: 21
O2 SAT: 95 %
PATIENT TEMPERATURE: 98.6
PCO2 ART: 32.5 mmHg (ref 32.0–48.0)
PO2 ART: 81.6 mmHg — AB (ref 83.0–108.0)
pH, Arterial: 7.385 (ref 7.350–7.450)

## 2016-02-29 LAB — URINALYSIS, ROUTINE W REFLEX MICROSCOPIC
Glucose, UA: >1000 mg/dL — AB
Ketones, ur: NEGATIVE mg/dL
Nitrite: NEGATIVE
Protein, ur: 30 mg/dL — AB
Specific Gravity, Urine: 1.03 (ref 1.005–1.030)
pH: 5 (ref 5.0–8.0)

## 2016-02-29 LAB — COMPREHENSIVE METABOLIC PANEL
ALK PHOS: 97 U/L (ref 38–126)
ALT: 52 U/L (ref 17–63)
ANION GAP: 12 (ref 5–15)
AST: 25 U/L (ref 15–41)
Albumin: 4.2 g/dL (ref 3.5–5.0)
BILIRUBIN TOTAL: 0.6 mg/dL (ref 0.3–1.2)
BUN: 49 mg/dL — AB (ref 6–20)
CALCIUM: 9.8 mg/dL (ref 8.9–10.3)
CO2: 19 mmol/L — ABNORMAL LOW (ref 22–32)
Chloride: 113 mmol/L — ABNORMAL HIGH (ref 101–111)
Creatinine, Ser: 2.99 mg/dL — ABNORMAL HIGH (ref 0.61–1.24)
GFR calc Af Amer: 22 mL/min — ABNORMAL LOW (ref >60)
GFR, EST NON AFRICAN AMERICAN: 19 mL/min — AB (ref >60)
Glucose, Bld: 556 mg/dL (ref 65–99)
POTASSIUM: 4.1 mmol/L (ref 3.5–5.1)
Sodium: 144 mmol/L (ref 135–145)
TOTAL PROTEIN: 8.4 g/dL — AB (ref 6.5–8.1)

## 2016-02-29 LAB — GLUCOSE, CAPILLARY
GLUCOSE-CAPILLARY: 328 mg/dL — AB (ref 65–99)
GLUCOSE-CAPILLARY: 240 mg/dL — AB (ref 65–99)
GLUCOSE-CAPILLARY: 132 mg/dL — AB (ref 65–99)
Glucose-Capillary: 360 mg/dL — ABNORMAL HIGH (ref 65–99)

## 2016-02-29 LAB — BLOOD GAS, VENOUS
Acid-base deficit: 4 mmol/L — ABNORMAL HIGH (ref 0.0–2.0)
BICARBONATE: 17.3 mmol/L — AB (ref 20.0–28.0)
FIO2: 21
O2 Saturation: 99.2 %
PATIENT TEMPERATURE: 98.6
PH VEN: 7.476 — AB (ref 7.250–7.430)
PO2 VEN: 170 mmHg — AB (ref 32.0–45.0)
pCO2, Ven: 23.7 mmHg — ABNORMAL LOW (ref 44.0–60.0)

## 2016-02-29 LAB — CBC WITH DIFFERENTIAL/PLATELET
Basophils Absolute: 0 10*3/uL (ref 0.0–0.1)
Basophils Relative: 0 %
Eosinophils Absolute: 0 10*3/uL (ref 0.0–0.7)
Eosinophils Relative: 0 %
HEMATOCRIT: 44.5 % (ref 39.0–52.0)
Hemoglobin: 15 g/dL (ref 13.0–17.0)
LYMPHS ABS: 1.3 10*3/uL (ref 0.7–4.0)
LYMPHS PCT: 9 %
MCH: 30.8 pg (ref 26.0–34.0)
MCHC: 33.7 g/dL (ref 30.0–36.0)
MCV: 91.4 fL (ref 78.0–100.0)
MONO ABS: 0.8 10*3/uL (ref 0.1–1.0)
MONOS PCT: 6 %
NEUTROS ABS: 11.6 10*3/uL — AB (ref 1.7–7.7)
Neutrophils Relative %: 85 %
Platelets: 305 10*3/uL (ref 150–400)
RBC: 4.87 MIL/uL (ref 4.22–5.81)
RDW: 14.5 % (ref 11.5–15.5)
WBC: 13.6 10*3/uL — ABNORMAL HIGH (ref 4.0–10.5)

## 2016-02-29 LAB — CBG MONITORING, ED
GLUCOSE-CAPILLARY: 531 mg/dL — AB (ref 65–99)
Glucose-Capillary: 412 mg/dL — ABNORMAL HIGH (ref 65–99)
Glucose-Capillary: 336 mg/dL — ABNORMAL HIGH (ref 65–99)

## 2016-02-29 LAB — I-STAT CG4 LACTIC ACID, ED
LACTIC ACID, VENOUS: 2.71 mmol/L — AB (ref 0.5–1.9)
Lactic Acid, Venous: 3.97 mmol/L (ref 0.5–1.9)

## 2016-02-29 LAB — TROPONIN I: Troponin I: 0.17 ng/mL (ref <0.03)

## 2016-02-29 LAB — URINE MICROSCOPIC-ADD ON
Bacteria, UA: NONE SEEN
RBC / HPF: NONE SEEN RBC/hpf (ref 0–5)

## 2016-02-29 LAB — PROCALCITONIN: PROCALCITONIN: 0.14 ng/mL

## 2016-02-29 LAB — PROTIME-INR
INR: 0.96
Prothrombin Time: 12.8 seconds (ref 11.4–15.2)

## 2016-02-29 LAB — AMMONIA: Ammonia: 13 umol/L (ref 9–35)

## 2016-02-29 LAB — MAGNESIUM: MAGNESIUM: 2.1 mg/dL (ref 1.7–2.4)

## 2016-02-29 LAB — MRSA PCR SCREENING: MRSA by PCR: NEGATIVE

## 2016-02-29 MED ORDER — INSULIN ASPART 100 UNIT/ML ~~LOC~~ SOLN: 2.0000 [IU] | SUBCUTANEOUS | Status: DC

## 2016-02-29 MED ORDER — VANCOMYCIN HCL IN DEXTROSE 1-5 GM/200ML-% IV SOLN
1000.0000 mg | Freq: Once | INTRAVENOUS | Status: AC
  Administered 2016-02-29: 1000 mg via INTRAVENOUS
  Filled 2016-02-29: qty 200

## 2016-02-29 MED ORDER — SODIUM CHLORIDE 0.9 % IV BOLUS (SEPSIS)
1000.0000 mL | Freq: Once | INTRAVENOUS | Status: AC
Start: 2016-02-29 — End: 2016-02-29
  Administered 2016-02-29: 1000 mL via INTRAVENOUS

## 2016-02-29 MED ORDER — FAMOTIDINE IN NACL 20-0.9 MG/50ML-% IV SOLN
20.0000 mg | INTRAVENOUS | Status: DC
  Administered 2016-03-01 – 2016-03-04 (×4): 20 mg via INTRAVENOUS
  Filled 2016-02-29 (×4): qty 50

## 2016-02-29 MED ORDER — VANCOMYCIN HCL IN DEXTROSE 1-5 GM/200ML-% IV SOLN
1000.0000 mg | INTRAVENOUS | Status: DC
  Administered 2016-03-01: 1000 mg via INTRAVENOUS
  Filled 2016-02-29: qty 200

## 2016-02-29 MED ORDER — SODIUM CHLORIDE 0.9 % IV BOLUS (SEPSIS)
500.0000 mL | Freq: Once | INTRAVENOUS | Status: AC
  Administered 2016-02-29: 500 mL via INTRAVENOUS

## 2016-02-29 MED ORDER — ORAL CARE MOUTH RINSE
15.0000 mL | Freq: Two times a day (BID) | OROMUCOSAL | Status: DC
Start: 2016-02-29 — End: 2016-03-01
  Administered 2016-03-01: 15 mL via OROMUCOSAL

## 2016-02-29 MED ORDER — SODIUM CHLORIDE 0.9 % IV SOLN
250.0000 mL | INTRAVENOUS | Status: DC | PRN
  Administered 2016-03-03: 250 mL via INTRAVENOUS

## 2016-02-29 MED ORDER — PIPERACILLIN-TAZOBACTAM 3.375 G IVPB
3.3750 g | Freq: Three times a day (TID) | INTRAVENOUS | Status: AC
  Administered 2016-02-29 – 2016-03-02 (×8): 3.375 g via INTRAVENOUS
  Filled 2016-02-29 (×8): qty 50

## 2016-02-29 MED ORDER — HEPARIN SODIUM (PORCINE) 5000 UNIT/ML IJ SOLN
5000.0000 [IU] | Freq: Three times a day (TID) | INTRAMUSCULAR | Status: DC
  Administered 2016-02-29 – 2016-03-04 (×12): 5000 [IU] via SUBCUTANEOUS
  Filled 2016-02-29 (×12): qty 1

## 2016-02-29 MED ORDER — NOREPINEPHRINE BITARTRATE 1 MG/ML IV SOLN
0.0000 ug/min | Freq: Once | INTRAVENOUS | Status: AC
  Administered 2016-02-29: 30 ug/min via INTRAVENOUS
  Filled 2016-02-29: qty 4

## 2016-02-29 MED ORDER — INSULIN ASPART 100 UNIT/ML ~~LOC~~ SOLN
0.0000 [IU] | SUBCUTANEOUS | Status: DC
  Administered 2016-02-29: 3 [IU] via SUBCUTANEOUS
  Administered 2016-02-29: 20 [IU] via SUBCUTANEOUS
  Administered 2016-02-29: 15 [IU] via SUBCUTANEOUS
  Administered 2016-02-29: 7 [IU] via SUBCUTANEOUS
  Administered 2016-03-01: 4 [IU] via SUBCUTANEOUS
  Administered 2016-03-01: 3 [IU] via SUBCUTANEOUS
  Administered 2016-03-01: 4 [IU] via SUBCUTANEOUS
  Administered 2016-03-01: 7 [IU] via SUBCUTANEOUS
  Administered 2016-03-01: 11 [IU] via SUBCUTANEOUS
  Administered 2016-03-02: 7 [IU] via SUBCUTANEOUS
  Administered 2016-03-02 (×3): 4 [IU] via SUBCUTANEOUS

## 2016-02-29 MED ORDER — ASPIRIN 81 MG PO CHEW: 324.0000 mg | CHEWABLE_TABLET | ORAL | Status: DC

## 2016-02-29 MED ORDER — LACTATED RINGERS IV BOLUS (SEPSIS)
1000.0000 mL | Freq: Once | INTRAVENOUS | Status: AC
Start: 2016-02-29 — End: 2016-02-29
  Administered 2016-02-29: 1000 mL via INTRAVENOUS

## 2016-02-29 MED ORDER — ASPIRIN 300 MG RE SUPP: 300.0000 mg | RECTAL | Status: DC

## 2016-02-29 MED ORDER — SODIUM CHLORIDE 0.9 % IV BOLUS (SEPSIS)
1000.0000 mL | Freq: Once | INTRAVENOUS | Status: AC
  Administered 2016-02-29: 1000 mL via INTRAVENOUS

## 2016-02-29 MED ORDER — DEXTROSE 5 % IV SOLN
5.0000 ug/min | INTRAVENOUS | Status: DC
  Administered 2016-02-29: 8 ug/min via INTRAVENOUS
  Filled 2016-02-29 (×4): qty 4

## 2016-02-29 MED ORDER — PIPERACILLIN-TAZOBACTAM 3.375 G IVPB 30 MIN
3.3750 g | Freq: Once | INTRAVENOUS | Status: AC
  Administered 2016-02-29: 3.375 g via INTRAVENOUS
  Filled 2016-02-29: qty 50

## 2016-02-29 NOTE — ED Notes (Signed)
Patient transported to CT 

## 2016-02-29 NOTE — Care Management Note (Signed)
Case Management Note  Patient Details  Name: Travion [facility]mire MRN: [id number] Date of Birth: [birth year]  Subjective/Objective:     Infection with hyperglycemia               Action/Plan:  home   Expected Discharge Date:                  Expected Discharge Plan:  Home/Self Care  In-House Referral:  NA  Discharge planning Services     Post Acute Care Choice:    Choice offered to:     DME Arranged:    DME Agency:     HH Arranged:    HH Agency:     Status of Service:  In process, will continue to follow  If discussed at Long Length of Stay Meetings, dates discussed:    Additional Comments:Date:  February 29, 2016 Chart reviewed for concurrent status and case management needs. Will continue to follow the patient for status change: Discharge Planning: following for needs Expected discharge date: [id number] Velva Harman, BSN, [place], [place]  Leeroy Cha, RN 02/29/2016, 10:31 AM

## 2016-02-29 NOTE — ED Provider Notes (Signed)
[facility] DEPT Provider Note   CSN: [id number] Arrival date & time: 02/29/16  0202   PATIENT DNR/DNI   History   Chief Complaint Chief Complaint  Patient presents with  . Hyperglycemia  . Altered Mental Status    HPI [facility] is a 80 y.o. male.  LEVEL 5 CAVEAT SECONDARY TO DEMENTIA AND AMS  80 year old male with a history of dementia, depression, diabetes mellitus, and dyslipidemia presents to the emergency department for evaluation of altered mental status. Wife reports that patient has been very lethargic lately. She was with him at his facility all day today and states that, "his sugars kept getting higher". She reports, "They kept giving him medicine, but his sugar kept going up". Wife states that the patient has been sleeping throughout the day ever since his discharge from [facility]. She reports that he was diagnosed with a urinary tract infection at that time. She does not believe the patient to currently be on antibiotics. She states that the patient is usually awake and conversant at baseline; alert to person and able to identify his wife. Wife denies any known fevers recently. She denies any vomiting or diarrhea today. No known sick contacts. Patient is a resident at [facility]      Past Medical History:  Diagnosis Date  . CKD (chronic kidney disease)   . Dementia   . 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 Diabetes mellitus without complication ([place])   . DVT (deep venous thrombosis) ([place]) 09/16/2013  . Glaucoma   . Latent tuberculosis 09/16/2013  . Osteoarthritis 09/16/2013  . Prostate cancer ([place]) 2006   s/p seed radiation  . Pure hypercholesterolemia 10/18/2013    Patient Active Problem List   Diagnosis Date Noted  . Dehydration 02/17/2016  . Diabetes mellitus type 2, uncontrolled ([place]) 02/17/2016  . Acute kidney injury superimposed on chronic kidney disease ([place]) 02/17/2016  . Goals of care, counseling/discussion 02/17/2016  . Sepsis ([place])  02/17/2016  . Acute encephalopathy 02/17/2016  . Palliative care encounter   . Major depression, chronic ([place]) 12/08/2015  . Chronic constipation 11/09/2015  . Type 2 diabetes mellitus with diabetic nephropathy, without long-term current use of insulin ([place]) 10/11/2015  . Microalbuminuria due to type 2 diabetes mellitus ([place]) 09/07/2015  . Alzheimer's disease 07/14/2015  . Type 2 diabetes mellitus with diabetic peripheral angiopathy without gangrene, without long-term current use of insulin ([place]) 07/14/2015  . Depression with anxiety 02/08/2015  . BPH without obstruction/lower urinary tract symptoms 10/24/2014  . Hyperlipidemia LDL goal <100 10/24/2014  . Type 2 diabetes with peripheral circulatory disorder, controlled ([place]) 10/24/2014  . Rectal bleeding 12/08/2013  . Glaucoma 11/02/2013  . 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 Alzheimer's dementia without behavioral disturbance 09/16/2013  . Osteoarthritis 09/16/2013    Past Surgical History:  Procedure Laterality Date  . COLONOSCOPY WITH PROPOFOL N/A 01/24/2014   Procedure: COLONOSCOPY WITH PROPOFOL;  Surgeon: Garlan Fair, MD;  Location[facility];  Service: Endoscopy;  Laterality: N/A;  . ivc filter placement         Home Medications    Prior to Admission medications   Medication Sig Start Date End Date Taking? Authorizing Provider  acetaminophen (TYLENOL) 500 MG tablet Take 500 mg by mouth every 4 (four) hours as needed for mild pain or fever.    Yes Historical Provider, MD  atorvastatin (LIPITOR) 20 MG tablet Take 20 mg by mouth daily.    Yes Historical Provider, MD  cefUROXime (CEFTIN) 500 MG tablet  Take 1 tablet (500 mg total) by mouth 2 (two) times daily with a meal. 02/20/16  Yes Verlee Monte, MD  citalopram (CELEXA) 10 MG tablet Take 5 mg by mouth daily.    Yes Historical Provider, MD  latanoprost (XALATAN) 0.005 % ophthalmic solution Place 1 drop into both eyes at bedtime. Instill one drop in each eye every  night at bedtime for glaucoma. Wait 3-5 minutes between 2 eye med   Yes Historical Provider, MD  lisinopril (PRINIVIL,ZESTRIL) 2.5 MG tablet Take 2.5 mg by mouth daily. Hold for SBP <110   Yes Historical Provider, MD  memantine (NAMENDA XR) 28 MG CP24 24 hr capsule Take 28 mg by mouth daily.    Yes Historical Provider, MD  metFORMIN (GLUCOPHAGE) 1000 MG tablet Take 1 tablet (1,000 mg total) by mouth 2 (two) times daily with a meal. 02/20/16  Yes Verlee Monte, MD  polyethylene glycol (MIRALAX / GLYCOLAX) packet Take 17 g by mouth daily.   Yes Historical Provider, MD  saccharomyces boulardii (FLORASTOR) 250 MG capsule Take 250 mg by mouth 2 (two) times daily. For 15 days started on 02/13/16   Yes Historical Provider, MD  sennosides-docusate sodium (SENOKOT-S) 8.6-50 MG tablet Take 1 tablet by mouth 2 (two) times daily.   Yes Historical Provider, MD  tamsulosin (FLOMAX) 0.4 MG CAPS capsule Take 0.4 mg by mouth daily.    Yes Historical Provider, MD    Family History History reviewed. No pertinent family history.  Social History Social History  Substance Use Topics  . Smoking status: Never Smoker  . Smokeless tobacco: Never Used  . Alcohol use No     Allergies   Review of patient's allergies indicates no known allergies.   Review of Systems Review of Systems  Unable to perform ROS: Dementia     Physical Exam Updated Vital Signs BP 104/59   Pulse 88   Temp 99.3 F (37.4 C) (Rectal)   Resp 19   SpO2 100%   Physical Exam  Constitutional: He appears well-developed and well-nourished.  HENT:  Head: Normocephalic and atraumatic.  Mildly dry mm  Eyes: Conjunctivae and EOM are normal. Pupils are equal, round, and reactive to light. No scleral icterus.  Neck: Normal range of motion.  Cardiovascular: Regular rhythm and intact distal pulses.   Tachycardia from 105 to 114bpm  Pulmonary/Chest: Effort normal. No respiratory distress. He has no wheezes. He has no rales.  No tachypnea or  dyspnea. No wheezes or rales appreciated. Chest expansion symmetric.  Abdominal: Soft. He exhibits no mass. There is no guarding.  Soft, obese abdomen without masses or peritoneal signs.  Musculoskeletal: Normal range of motion.  No BLE pitting edema.  Neurological:  Lethargic and spontaneously moving BUE. Will withdraw from painful stimuli.  Skin: Skin is warm and dry. No rash noted. No erythema. No pallor.  Psychiatric: He has a normal mood and affect. His behavior is normal.  Nursing note and vitals reviewed.    ED Treatments / Results  Labs (all labs ordered are listed, but only abnormal results are displayed) Labs Reviewed  CBC WITH DIFFERENTIAL/PLATELET - Abnormal; Notable for the following:       Result Value   WBC 13.6 (*)    Neutro Abs 11.6 (*)    All other components within normal limits  COMPREHENSIVE METABOLIC PANEL - Abnormal; Notable for the following:    Chloride 113 (*)    CO2 19 (*)    Glucose, Bld 556 (*)    BUN  49 (*)    Creatinine, Ser 2.99 (*)    Total Protein 8.4 (*)    GFR calc non Af Amer 19 (*)    GFR calc Af Amer 22 (*)    All other components within normal limits  URINALYSIS, ROUTINE W REFLEX MICROSCOPIC (NOT AT [facility]) - Abnormal; Notable for the following:    APPearance CLOUDY (*)    Glucose, UA >1000 (*)    Hgb urine dipstick TRACE (*)    Bilirubin Urine SMALL (*)    Protein, ur 30 (*)    Leukocytes, UA TRACE (*)    All other components within normal limits  BLOOD GAS, VENOUS - Abnormal; Notable for the following:    pH, Ven 7.476 (*)    pCO2, Ven 23.7 (*)    pO2, Ven 170.0 (*)    Bicarbonate 17.3 (*)    Acid-base deficit 4.0 (*)    All other components within normal limits  TROPONIN I - Abnormal; Notable for the following:    Troponin I 0.17 (*)    All other components within normal limits  URINE MICROSCOPIC-ADD ON - Abnormal; Notable for the following:    Squamous Epithelial / LPF 0-5 (*)    All other components within normal limits    BLOOD GAS, ARTERIAL - Abnormal; Notable for the following:    pO2, Arterial 81.6 (*)    Bicarbonate 19.0 (*)    Acid-base deficit 4.6 (*)    All other components within normal limits  CBG MONITORING, ED - Abnormal; Notable for the following:    Glucose-Capillary 531 (*)    All other components within normal limits  I-STAT CG4 LACTIC ACID, ED - Abnormal; Notable for the following:    Lactic Acid, Venous 3.97 (*)    All other components within normal limits  CBG MONITORING, ED - Abnormal; Notable for the following:    Glucose-Capillary 412 (*)    All other components within normal limits  URINE CULTURE  CULTURE, BLOOD (ROUTINE X 2)  CULTURE, BLOOD (ROUTINE X 2)  PROTIME-INR  PROTIME-INR  AMMONIA    EKG  EKG Interpretation  Date/Time:  Thursday February 29 2016 02:10:49 EDT Ventricular Rate:  111 PR Interval:    QRS Duration: 72 QT Interval:  337 QTC Calculation: 458 R Axis:   -29 Text Interpretation:  Sinus tachycardia Borderline left axis deviation Low voltage, extremity and precordial leads No acute changes No significant change since last tracing Confirmed by Kathrynn Humble, MD, Thelma Comp [id number]) on 02/29/2016 3:47:04 AM       Radiology Ct Head Wo Contrast  Result Date: 02/29/2016 CLINICAL DATA:  Altered mental status.  Hyperglycemia. EXAM: CT HEAD WITHOUT CONTRAST TECHNIQUE: Contiguous axial images were obtained from the base of the skull through the vertex without intravenous contrast. COMPARISON:  Head CT 02/16/2016 FINDINGS: Brain: No evidence of acute infarction, hemorrhage, hydrocephalus, extra-axial collection or mass lesion/mass effect. Stable atrophy and chronic small vessel ischemia. Vascular: Atherosclerosis of skullbase vasculature. No hyperdense vessel. Skull: Normal. Negative for fracture or focal lesion. Sinuses/Orbits: No acute finding. IMPRESSION: No acute intracranial abnormality. Electronically Signed   By: Jeb Levering M.D.   On: 02/29/2016 03:59   Dg Chest  Port 1 View  Result Date: 02/29/2016 CLINICAL DATA:  Hyperglycemia. EXAM: PORTABLE CHEST 1 VIEW COMPARISON:  06/01/2013 FINDINGS: There are low lung volumes limiting assessment. Bibasilar opacities, favoring atelectasis. Normal heart size and mediastinal contours for technique. No large pleural effusion or pneumothorax. Osseous structures are intact. IMPRESSION: Low lung  volumes with bibasilar opacities, likely atelectasis. Electronically Signed   By: Jeb Levering M.D.   On: 02/29/2016 03:49    Procedures Procedures (including critical care time)  Medications Ordered in ED Medications  lactated ringers bolus 1,000 mL (1,000 mLs Intravenous New Bag/Given 02/29/16 0406)  sodium chloride 0.9 % bolus 1,000 mL (0 mLs Intravenous Stopped 02/29/16 0406)  sodium chloride 0.9 % bolus 1,000 mL (0 mLs Intravenous Stopped 02/29/16 0406)  piperacillin-tazobactam (ZOSYN) IVPB 3.375 g (0 g Intravenous Stopped 02/29/16 0406)  vancomycin (VANCOCIN) IVPB 1000 mg/200 mL premix (0 mg Intravenous Stopped 02/29/16 0449)  sodium chloride 0.9 % bolus 500 mL (0 mLs Intravenous Stopped 02/29/16 0449)  norepinephrine (LEVOPHED) 4 mg in dextrose 5 % 250 mL (0.016 mg/mL) infusion (14 mcg/min Intravenous Rate/Dose Change 02/29/16 0519)    CRITICAL CARE Performed by: Antonietta Breach   Total critical care time: 60 minutes  Critical care time was exclusive of separately billable procedures and treating other patients.  Critical care was necessary to treat or prevent imminent or life-threatening deterioration.  Critical care was time spent personally by me on the following activities: development of treatment plan with patient and/or surrogate as well as nursing, discussions with consultants, evaluation of patient's response to treatment, examination of patient, obtaining history from patient or surrogate, ordering and performing treatments and interventions, ordering and review of laboratory studies, ordering and review of  radiographic studies, pulse oximetry and re-evaluation of patient's condition.    Initial Impression / Assessment and Plan / ED Course  I have reviewed the triage vital signs and the nursing notes.  Pertinent labs & imaging results that were available during my care of the patient were reviewed by me and considered in my medical decision making (see chart for details).  Clinical Course    3:00 AM Patient presenting with AMS.Vitals concerning for sepsis, though patient is afebrile. Hx of admission this month for UTI. Patient hyperglycemic. 2.5L IVF initiated as well as broad spectrum antibiotics. Will reassess BP to see if responding to fluids.  3:37 AM Patient with hypotension despite 2L IVF; BP currently 67/40's. Plan for central line. Will start pressors. Patient will require admission. Plan to consult critical care. Patient is DNR/DNI.  4:37 AM BP improving with IV pressors; currently titrated to 72mcg. Wife declines central line at this time. Case discussed with Dr. Stevenson Clinch. PCCM to see for admission.  5:19 AM Sepsis - Repeat Assessment  Performed at:    5:19AM  Vitals     Blood pressure 118/66, pulse 86, temperature 99.3 F (37.4 C), temperature source Rectal, resp. rate 13, SpO2 100 %.  Heart:     Regular rate and rhythm  Lungs:    CTA  Capillary Refill:   <2 sec  Peripheral Pulse:   Radial pulse palpable  Skin:     Normal Color   5:23 AM Critical care at bedside assessing for admission.   Final Clinical Impressions(s) / ED Diagnoses   Final diagnoses:  Hyperglycemia  Septic shock (HCC)  Acute renal failure, unspecified acute renal failure type ([place])    Vitals:   02/29/16 0443 02/29/16 0445 02/29/16 0500 02/29/16 0515  BP: (!) 88/59 100/64 122/74 118/66  Pulse: 83 83 85 86  Resp: 12 21 19 13   Temp:      TempSrc:      SpO2: 100% 100% 100% 100%    New Prescriptions New Prescriptions   No medications on file     Antonietta Breach, PA-C 02/29/16 [id number]  Varney Biles, MD 02/29/16 [id number]

## 2016-02-29 NOTE — ED Notes (Signed)
CBG 531

## 2016-02-29 NOTE — H&P (Signed)
PULMONARY / CRITICAL CARE MEDICINE   Name: Derrick Rubio MRN: [id number] DOB: 01-Aug-1935    ADMISSION DATE:  02/29/2016 CONSULTATION DATE:  02/29/2016  REFERRING MD:  Dr. Kathrynn Humble  CHIEF COMPLAINT:  AMS  HISTORY OF PRESENT ILLNESS:   80 year old male with PMH as below, which is significant for Alzheimer's dementia, CKD, DM, latent TB and prostate Ca. He has been in SNF for a few years now due to Alzheimer's and is wheelchair bound as he no longer walks. His wife states that he can recognize family members, but is very limited with his ADLs. He was recently admitted for UTI and was treated with ABX. Wife reports he has never really gotten back to baseline since that admission. He was doing OK for a couple of days, but then grew progressively drowsy to the point he was not waking up for meals. 9/14 he was essentially unresponsive with glucose up to 700s so he was transferred to ER. In ER he remained hyperglycemic and was provided IVF resuscitation. He developed hypotension and was started on vasoactive infusion. Concern for sepsis with recent UTI and treated with ABX. Due to pressors PCCM consulted for admission. Of note he is DNR/DNI  PAST MEDICAL HISTORY :  He  has a past medical history of CKD (chronic kidney disease); Dementia; Depression due to dementia (09/16/2013); Diabetes mellitus without complication ([place]); DVT (deep venous thrombosis) ([place]) (09/16/2013); Glaucoma; Latent tuberculosis (09/16/2013); Osteoarthritis (09/16/2013); Prostate cancer ([place]) (2006); and Pure hypercholesterolemia (10/18/2013).  PAST SURGICAL HISTORY: He  has a past surgical history that includes ivc filter placement and Colonoscopy with propofol (N/A, 01/24/2014).  No Known Allergies  No current facility-administered medications on file prior to encounter.    Current Outpatient Prescriptions on File Prior to Encounter  Medication Sig  . acetaminophen (TYLENOL) 500 MG tablet Take 500 mg by mouth every 4 (four) hours as  needed for mild pain or fever.   Marland Kitchen atorvastatin (LIPITOR) 20 MG tablet Take 20 mg by mouth daily.   . cefUROXime (CEFTIN) 500 MG tablet Take 1 tablet (500 mg total) by mouth 2 (two) times daily with a meal.  . citalopram (CELEXA) 10 MG tablet Take 5 mg by mouth daily.   Marland Kitchen latanoprost (XALATAN) 0.005 % ophthalmic solution Place 1 drop into both eyes at bedtime. Instill one drop in each eye every night at bedtime for glaucoma. Wait 3-5 minutes between 2 eye med  . lisinopril (PRINIVIL,ZESTRIL) 2.5 MG tablet Take 2.5 mg by mouth daily. Hold for SBP <110  . memantine (NAMENDA XR) 28 MG CP24 24 hr capsule Take 28 mg by mouth daily.   . metFORMIN (GLUCOPHAGE) 1000 MG tablet Take 1 tablet (1,000 mg total) by mouth 2 (two) times daily with a meal.  . polyethylene glycol (MIRALAX / GLYCOLAX) packet Take 17 g by mouth daily.  Marland Kitchen saccharomyces boulardii (FLORASTOR) 250 MG capsule Take 250 mg by mouth 2 (two) times daily. For 15 days started on 02/13/16  . sennosides-docusate sodium (SENOKOT-S) 8.6-50 MG tablet Take 1 tablet by mouth 2 (two) times daily.  . tamsulosin (FLOMAX) 0.4 MG CAPS capsule Take 0.4 mg by mouth daily.     FAMILY HISTORY:  His has no family status information on file.    SOCIAL HISTORY: He  reports that he has never smoked. He has never used smokeless tobacco. He reports that he does not drink alcohol or use drugs.  REVIEW OF SYSTEMS:   Unable as patient is encephalopathic  SUBJECTIVE:  VITAL SIGNS: BP 118/66   Pulse 86   Temp 99.3 F (37.4 C) (Rectal)   Resp 13   SpO2 100%   HEMODYNAMICS:    VENTILATOR SETTINGS:    INTAKE / OUTPUT: No intake/output data recorded.  PHYSICAL EXAMINATION: General:  Frail elderly male in NAD Neuro:  Obtunded, only facial grimace to sternal rub.  HEENT:  [place]/AT, PERRL, no JVD Cardiovascular:  RRR, no MRG Lungs:  Clear bilateral breath sounds Abdomen:  Soft, non-distended Musculoskeletal:  No acute deformity Skin:  Grossly  intact  LABS:  BMET  Recent Labs Lab 02/29/16 0225  NA 144  K 4.1  CL 113*  CO2 19*  BUN 49*  CREATININE 2.99*  GLUCOSE 556*    Electrolytes  Recent Labs Lab 02/29/16 0225  CALCIUM 9.8    CBC  Recent Labs Lab 02/29/16 0225  WBC 13.6*  HGB 15.0  HCT 44.5  PLT 305    Coag's  Recent Labs Lab 02/29/16 0225  INR 0.96    Sepsis Markers  Recent Labs Lab 02/29/16 0241  LATICACIDVEN 3.97*    ABG  Recent Labs Lab 02/29/16 0500  PHART 7.385  PCO2ART 32.5  PO2ART 81.6*    Liver Enzymes  Recent Labs Lab 02/29/16 0225  AST 25  ALT 52  ALKPHOS 97  BILITOT 0.6  ALBUMIN 4.2    Cardiac Enzymes  Recent Labs Lab 02/29/16 0225  TROPONINI 0.17*    Glucose  Recent Labs Lab 02/29/16 0222 02/29/16 0315  GLUCAP 531* 412*    Imaging Ct Head Wo Contrast  Result Date: 02/29/2016 CLINICAL DATA:  Altered mental status.  Hyperglycemia. EXAM: CT HEAD WITHOUT CONTRAST TECHNIQUE: Contiguous axial images were obtained from the base of the skull through the vertex without intravenous contrast. COMPARISON:  Head CT 02/16/2016 FINDINGS: Brain: No evidence of acute infarction, hemorrhage, hydrocephalus, extra-axial collection or mass lesion/mass effect. Stable atrophy and chronic small vessel ischemia. Vascular: Atherosclerosis of skullbase vasculature. No hyperdense vessel. Skull: Normal. Negative for fracture or focal lesion. Sinuses/Orbits: No acute finding. IMPRESSION: No acute intracranial abnormality. Electronically Signed   By: Jeb Levering M.D.   On: 02/29/2016 03:59   Dg Chest Port 1 View  Result Date: 02/29/2016 CLINICAL DATA:  Hyperglycemia. EXAM: PORTABLE CHEST 1 VIEW COMPARISON:  06/01/2013 FINDINGS: There are low lung volumes limiting assessment. Bibasilar opacities, favoring atelectasis. Normal heart size and mediastinal contours for technique. No large pleural effusion or pneumothorax. Osseous structures are intact. IMPRESSION: Low lung  volumes with bibasilar opacities, likely atelectasis. Electronically Signed   By: Jeb Levering M.D.   On: 02/29/2016 03:49     STUDIES:    CULTURES: Blood 9/14 > Urine 9/14 >  ANTIBIOTICS: Zosyn 9/14 > Vancomycin 9/14 >  SIGNIFICANT EVENTS:   LINES/TUBES:   DISCUSSION: 80 year old male with Alzheimer's with recent admit for UTI. Wheelchair bound in SNF. Now presenting with AMS, hyperglycemia, and ? Sepsis. Shock on levo. Admit to ICU. Conservative management from here on out. No lines/tubes. DNR/DNI. Continue pressors cap Levo at 20. Treat with volume and ABX, sliding scale insulin, no drip for HHNK. If no improvement in mental status here soon wife may consider comfort.   ASSESSMENT / PLAN:  PULMONARY A: Inability to protect airway in setting of encephalopathy  P:   Supplemental O2 to keep sats > 92% DNI  CARDIOVASCULAR A:  Shock, hypovolemic vs septic (doubt) Chronic hypotension with SBP 90s - low 100's.  Elevated lactic Elevated troponin. Suspect demand, no  ischemia on EKG   P:  Telemetry monitoring Aggressive IVF resuscitation in setting shock + HHNK Tolerate MAP > 65mmHg Levophed cap 28mcg Ensure lactic clearing No CVL per discussion with wife Manage conservatively DNR  RENAL A:   Acute kidney injury in setting hypovolemia Chronic kidney disease  P:   Volume Follow BMP  GASTROINTESTINAL A:   No acute issues  P:   IV pepcid for SUP NPO  HEMATOLOGIC A:   No acute issues  P:  Follow CBC  INFECTIOUS A:   ? Sepsis, no clear source. Urine looks OK Leukocytosis potentially stress response  P:   Continue broad ABX as above Follow cultures PCT Low threshold to DC ABX  ENDOCRINE A:   HHNK  DM    P:   Aggressive IVF CBG monitoring and SSI One additional L bolus now (4 total)  NEUROLOGIC A:   Acute metabolic encephalopathy  P:   Monitor   FAMILY  - Updates: wife updated. Wants to be conservative in managing. Nothing  invasive.   - Inter-disciplinary family meet or Palliative Care meeting due by:  9/21    Georgann Housekeeper, AGACNP-BC [place] [facility] Pager [phone] or [phone]  02/29/2016 5:39 AM   Attending note: I have seen and examined the patient with nurse practitioner/resident and agree with the note. History, labs and imaging reviewed.  80 Y/O with alzheimers, dementia, CKD, Prostate cancer, recent admit for UTI. Admitted today AM for altered mental status, shock, hyperglycemia.  On levaphed via peripehral lines. BP is improving He may have sepsis but source not clear yet. Blood sugars are improving  Given his advanced dementia and decline we will not pursue aggressive measures, no CVL. Code status DNR/DNI confirmed with wife. Continue abx, IVF, glucose control.  Rest of plan as above  Critical care time - 35 mins. This represents my time independent of the NPs time taking care of the pt.  Marshell Garfinkel MD [place] [facility] Pager [phone] If no answer or after 3pm call: [phone] 02/29/2016, 10:10 AM

## 2016-02-29 NOTE — ED Notes (Addendum)
CBG 431

## 2016-02-29 NOTE — Progress Notes (Signed)
Pharmacy Antibiotic Note  Derrick Rubio is a 80 y.o. male admitted on 02/29/2016 with sepsis.  Pharmacy has been consulted for Vancomycin and Zosyn  dosing.  Plan: Vancomycin 1gm IV every 24 hours.  Goal trough 15-20 mcg/mL. Zosyn 3.375g IV q8h (4 hour infusion).     Temp (24hrs), Avg:99.3 F (37.4 C), Min:99.3 F (37.4 C), Max:99.3 F (37.4 C)   Recent Labs Lab 02/29/16 0225 02/29/16 0241  WBC 13.6*  --   CREATININE 2.99*  --   LATICACIDVEN  --  3.97*    Estimated Creatinine Clearance: 22.3 mL/min (by C-G formula based on SCr of 2.99 mg/dL (H)).    No Known Allergies  Antimicrobials this admission: Vancomycin 02/29/2016 >> Zosyn 02/29/2016 >>   Dose adjustments this admission: -  Microbiology results: Pending  Thank you for allowing pharmacy to be a part of this patient's care.  Nani Skillern Crowford 02/29/2016 5:45 AM

## 2016-02-29 NOTE — ED Notes (Signed)
Spoke to lab, confirmed Troponin is being processed.

## 2016-02-29 NOTE — ED Triage Notes (Signed)
Patient BIB [facility] from [facility]. Patient has being treated for ongoing  UTI has been increasingly less responsive over the past week. Patient will withdraw when arms are moved and eye lids are opened. Patient will not respond to voice. Hyperglycemic, and per EMS  glucose has continued to rise.

## 2016-02-29 NOTE — Progress Notes (Addendum)
Initial Nutrition Assessment  DOCUMENTATION CODES:   Severe malnutrition in context of acute illness/injury  INTERVENTION:  - Diet advancement as medically feasible. - Will provide nutrition-related interventions as warranted with diet advancement.  NUTRITION DIAGNOSIS:   Inadequate oral intake related to inability to eat as evidenced by NPO status.   GOAL:   Patient will meet greater than or equal to 90% of their needs  MONITOR:   Diet advancement, Weight trends, Labs, I & O's  REASON FOR ASSESSMENT:   Malnutrition Screening Tool  ASSESSMENT:   80 year old male with PMH as below, which is significant for Alzheimer's dementia, CKD, DM, latent TB and prostate cancer. He has been in SNF for a few years now due to Alzheimer's and is wheelchair bound as he no longer walks. His wife states that he can recognize family members, but is very limited with his ADLs. He was recently admitted for UTI and was treated with ABX. Wife reports he has never really gotten back to baseline since that admission. He was doing OK for a couple of days, but then grew progressively drowsy to the point he was not waking up for meals. 9/14 he was essentially unresponsive with glucose up to 700s so he was transferred to ER.   Pt seen for MST. BMI indicates overweight status. Pt has been NPO since admission. He was sleeping at time of visit and noted to have hx of Alzheimer's dementia. RN at bedside reports that wife left a short time ago and plans to return in a few hours; RD will attempt to talk with her this afternoon. Pt is from a SNF and is wheelchair bound.   Pt was seen by an RD on 02/19/16 at [facility] and at that time wife reported that pt drinks Glucerna Shakes. No other information pertaining to diet at SNF available at that time.   Physical assessment shows no muscle or fat wasting, no edema. Per chart review, pt has lost 20 lbs (9.6% body weight) in the past 3 months which is significant for time frame.  Weight trends in chart show ongoing weight fluctuations (up and down) over the past 7 months. Will closely monitor trends during hospitalization.   Medications reviewed; sliding scale Novolog. Labs reviewed; CBGs: 336-531 mg/dL since admission, lactic acid: 2.71 mmol/L. Drip: Levo @ 6 mcg/min.   ADDENDUM: Wife present at bedside at this time. She reports that pt's appetite began to decline at the beginning of August, that it increased around the time of hospitalization earlier this month, and that since d/c a few days ago appetite has begun to again decline. She states that at the facility pt is provided with pureed (Dysphagia 1) diet with thin liquids and that he consumes [employer] and [employer]. Will order these supplements once diet is able to be advanced.   Pt meets criteria for malnutrition based on weight loss and <50% intakes for >/= 5 days.     Diet Order:  Diet NPO time specified  Skin:  Reviewed, no issues  Last BM:  PTA  Height:   Ht Readings from Last 1 Encounters:  02/29/16 6' (1.829 m)    Weight:   Wt Readings from Last 1 Encounters:  02/29/16 188 lb 15 oz (85.7 kg)    Ideal Body Weight:  80.91 kg  BMI:  Body mass index is 25.62 kg/m.  Estimated Nutritional Needs:   Kcal:  1600-1800  Protein:  65-75 grams  Fluid:  1.6-1.8 L/day  EDUCATION NEEDS:  No education needs identified at this time    Jarome Matin, MS, RD, LDN Inpatient Clinical Dietitian Pager # [phone] After hours/weekend pager # [phone]

## 2016-02-29 NOTE — Progress Notes (Signed)
S:  No acute events overnight.  Weaning levophed - down to 6 mcg.  Wife at bedside - reports pt has slept most of the night.  RN notes CBG remains elevated but improved.      O: Blood pressure 114/70, pulse 86, temperature 98.6 F (37 C), temperature source Oral, resp. rate 14, height 6' (1.829 m), weight 188 lb 15 oz (85.7 kg), SpO2 98 %.   Recent Labs Lab 02/29/16 0225  NA 144  K 4.1  CL 113*  CO2 19*  GLUCOSE 556*  BUN 49*  CREATININE 2.99*  CALCIUM 9.8    Recent Labs Lab 02/29/16 0225  HGB 15.0  HCT 44.5  WBC 13.6*  PLT 305   Lactic Acid, Venous    Component Value Date/Time   LATICACIDVEN 2.71 (HH) 02/29/2016 0548   CBG (last 3)   Recent Labs  02/29/16 0315 02/29/16 0534 02/29/16 0723  GLUCAP 412* 336* 360*     General: frail elderly male in NAD, lying in bed Neuro: sleeping, arouses with physical stimulation  HENT: MM pink/dry, no jvd CV: s1s2 rrr, no m/r/g PULM: even/non-labored, lungs bilaterally clear GI: obese/soft, bsx4 active  Extremities: warm/dry, no edema    A: 80 year old male with Alzheimer's with recent admit for UTI. Wheelchair bound in SNF. Admitted with AMS, hyperglycemia, and ? sepsis vs hypovolemia. Shock on levo. Admited to ICU. Conservative management from here on out. No lines/tubes. DNR/DNI. Continue pressors cap Levo at 20. Treat with volume and ABX, sliding scale insulin, no drip for HHNK. If no improvement in mental status here soon wife may consider comfort.   Suspected Hypovolemic Shock  Leukocytosis  Chronic Hypotension - SBP ~100 at baseline Lactic Acid Elevation - clearing  Troponin Leak  Acute Kidney Injury  CKD  HHNK DM  Acute Metabolic Encephalopathy    P: Continue gentle IVF's Wean levophed to off for MAP >65.  CAP of levophed at 20 mcg Repeat labs in am > BMP, CBC Follow cultures  DNI / DNR.  No cvl placement per wife. Conservative management Trend BMP / UOP  Replace electrolytes as indicated  Adjust  insulin therapy Follow cultures  Empiric abx Trend PCT   Family: Wife updated at bedside am 9/14.     Noe Gens, NP-C [place] [facility] Pgr: [phone] or if no answer [phone] 02/29/2016, 8:51 AM

## 2016-02-29 NOTE — Clinical Social Work Note (Signed)
Clinical Social Work Assessment  Patient Details  Name: Derrick Rubio MRN: [id number] Date of Birth: 20-Oct-1935  Date of referral:  02/29/16               Reason for consult:  Discharge Planning, Facility Placement                Permission sought to share information with:  Facility Art therapist granted to share information::  Yes, Verbal Permission Granted  Name::        Agency::     Relationship::     Contact Information:     Housing/Transportation Living arrangements for the past 2 months:  [place] of Information:  Spouse Patient Interpreter Needed:  None Criminal Activity/Legal Involvement Pertinent to Current Situation/Hospitalization:  No - Comment as needed Significant Relationships:  Spouse Lives with:  Facility Resident Do you feel safe going back to the place where you live?  Yes Need for family participation in patient care:  Yes (Comment)  Care giving concerns:  Spouse reports that she spends most of her day with spouse at SNF. " I make sure my husband gets good care. "   Social Worker assessment / plan: Pt hospitalized from [place] on 02/29/16 with multiple medical issues including AMS. CSW met with spouse at bedside to assist with d/c planning. Pt is unable to participate in d/c planning due to cognitive deficits. Spouse reports that she would like pt to return to SNF when stable. Clinicals sent to SNF for review. [facility] is able to accept pt back when medically stable/ back to baseline. CSW will continue to follow to assist with d/c planning to SNF.  Employment status:  Retired Nurse, adult, Medicaid In [place] PT Recommendations:  Not assessed at this time [place] / Referral to community resources:  [place]  Patient/Family's Response to care:  Spouse expects pt to return to SNF at d/c if needs can be met.  Patient/Family's Understanding of and Emotional  Response to Diagnosis, Current Treatment, and Prognosis:  Spouse has met with MD and is aware of pt's medical status. Pt has DNR/DNI order. Spouse is accepting of pt's medical condition.  Emotional Assessment Appearance:  Appears stated age Attitude/Demeanor/Rapport:    Affect (typically observed):  Unable to Assess Orientation:   (unresponsive) Alcohol / Substance use:    Psych involvement (Current and /or in the community):  No (Comment)  Discharge Needs  Concerns to be addressed:  Discharge Planning Concerns Readmission within the last 30 days:  Yes Current discharge risk:  None Barriers to Discharge:  No Barriers Identified   Derrick Rubio, [place] 02/29/2016, 1:50 PM

## 2016-02-29 NOTE — ED Notes (Signed)
Bed: [id number] Expected date:  Expected time:  Means of arrival:  Comments: EMS 80 yo male from SNF/UTI current/hyperglycemia BS 596-dementia/unresponsive

## 2016-02-29 NOTE — ED Notes (Signed)
Levophed rate changed to 76mcg/min

## 2016-02-29 NOTE — Progress Notes (Signed)
eLink Physician-Brief Progress Note Patient Name: Boe Goffredo DOB: [birth year] MRN: [id number]   Date of Service  02/29/2016  HPI/Events of Note  Notified by bedside nurse of short run of wide complex tachycardia. Magnesium 2.1. Potassium 4.1 this morning.   eICU Interventions  Continuing close ICU monitoring and telemetry      Intervention Category Intermediate Interventions: Arrhythmia - evaluation and management  Tera Partridge 02/29/2016, 8:50 PM

## 2016-03-01 DIAGNOSIS — E43 Unspecified severe protein-calorie malnutrition: Secondary | ICD-10-CM | POA: Diagnosis present

## 2016-03-01 LAB — CBC
HCT: 40.5 % (ref 39.0–52.0)
HEMOGLOBIN: 13.5 g/dL (ref 13.0–17.0)
MCH: 30.8 pg (ref 26.0–34.0)
MCHC: 33.3 g/dL (ref 30.0–36.0)
MCV: 92.3 fL (ref 78.0–100.0)
Platelets: 261 10*3/uL (ref 150–400)
RBC: 4.39 MIL/uL (ref 4.22–5.81)
RDW: 14.4 % (ref 11.5–15.5)
WBC: 11.2 10*3/uL — ABNORMAL HIGH (ref 4.0–10.5)

## 2016-03-01 LAB — URINE CULTURE: CULTURE: NO GROWTH

## 2016-03-01 LAB — BASIC METABOLIC PANEL
Anion gap: 6 (ref 5–15)
BUN: 21 mg/dL — AB (ref 6–20)
CHLORIDE: 118 mmol/L — AB (ref 101–111)
CO2: 23 mmol/L (ref 22–32)
CREATININE: 1.37 mg/dL — AB (ref 0.61–1.24)
Calcium: 8.6 mg/dL — ABNORMAL LOW (ref 8.9–10.3)
GFR calc Af Amer: 55 mL/min — ABNORMAL LOW (ref >60)
GFR calc non Af Amer: 48 mL/min — ABNORMAL LOW (ref >60)
Glucose, Bld: 195 mg/dL — ABNORMAL HIGH (ref 65–99)
Potassium: 3.6 mmol/L (ref 3.5–5.1)
SODIUM: 147 mmol/L — AB (ref 135–145)

## 2016-03-01 LAB — GLUCOSE, CAPILLARY
GLUCOSE-CAPILLARY: 197 mg/dL — AB (ref 65–99)
GLUCOSE-CAPILLARY: 209 mg/dL — AB (ref 65–99)
GLUCOSE-CAPILLARY: 230 mg/dL — AB (ref 65–99)
Glucose-Capillary: 146 mg/dL — ABNORMAL HIGH (ref 65–99)
Glucose-Capillary: 196 mg/dL — ABNORMAL HIGH (ref 65–99)
Glucose-Capillary: 243 mg/dL — ABNORMAL HIGH (ref 65–99)
Glucose-Capillary: 284 mg/dL — ABNORMAL HIGH (ref 65–99)

## 2016-03-01 MED ORDER — ONDANSETRON HCL 4 MG/2ML IJ SOLN
4.0000 mg | Freq: Four times a day (QID) | INTRAMUSCULAR | Status: DC | PRN
  Administered 2016-03-01: 4 mg via INTRAVENOUS
  Filled 2016-03-01: qty 2

## 2016-03-01 MED ORDER — ORAL CARE MOUTH RINSE
15.0000 mL | Freq: Two times a day (BID) | OROMUCOSAL | Status: DC
  Administered 2016-03-01 – 2016-03-03 (×4): 15 mL via OROMUCOSAL

## 2016-03-01 MED ORDER — TAMSULOSIN HCL 0.4 MG PO CAPS
0.4000 mg | ORAL_CAPSULE | Freq: Every day | ORAL | Status: DC
  Administered 2016-03-02 – 2016-03-04 (×3): 0.4 mg via ORAL
  Filled 2016-03-01 (×5): qty 1

## 2016-03-01 MED ORDER — SACCHAROMYCES BOULARDII 250 MG PO CAPS
250.0000 mg | ORAL_CAPSULE | Freq: Two times a day (BID) | ORAL | Status: DC
  Administered 2016-03-01 – 2016-03-02 (×2): 250 mg via ORAL
  Filled 2016-03-01 (×3): qty 1

## 2016-03-01 MED ORDER — LATANOPROST 0.005 % OP SOLN
1.0000 [drp] | Freq: Every day | OPHTHALMIC | Status: DC
  Administered 2016-03-01 – 2016-03-03 (×3): 1 [drp] via OPHTHALMIC
  Filled 2016-03-01: qty 2.5

## 2016-03-01 MED ORDER — CHLORHEXIDINE GLUCONATE 0.12 % MT SOLN
15.0000 mL | Freq: Two times a day (BID) | OROMUCOSAL | Status: DC
  Administered 2016-03-01 – 2016-03-04 (×6): 15 mL via OROMUCOSAL
  Filled 2016-03-01 (×5): qty 15

## 2016-03-01 MED ORDER — SODIUM CHLORIDE 0.9 % IV SOLN
INTRAVENOUS | Status: DC
  Administered 2016-03-01: 10:00:00 via INTRAVENOUS

## 2016-03-01 MED ORDER — MEMANTINE HCL ER 28 MG PO CP24
28.0000 mg | ORAL_CAPSULE | Freq: Every day | ORAL | Status: DC
  Administered 2016-03-01 – 2016-03-04 (×4): 28 mg via ORAL
  Filled 2016-03-01 (×4): qty 1

## 2016-03-01 NOTE — Progress Notes (Signed)
PULMONARY / CRITICAL CARE MEDICINE   Name: Derrick Rubio MRN: [id number] DOB: Jan 24, 1936    ADMISSION DATE:  02/29/2016 CONSULTATION DATE:  02/29/2016  REFERRING MD:  Dr. Kathrynn Humble  CHIEF COMPLAINT:  AMS  HISTORY OF PRESENT ILLNESS:   80 year old male with PMH as below, which is significant for Alzheimer's dementia, CKD, DM, latent TB and prostate Ca. He has been in SNF for a few years now due to Alzheimer's and is wheelchair bound as he no longer walks. His wife states that he can recognize family members, but is very limited with his ADLs. He was recently admitted for UTI and was treated with ABX. Wife reports he has never really gotten back to baseline since that admission. He was doing OK for a couple of days, but then grew progressively drowsy to the point he was not waking up for meals. 9/14 he was essentially unresponsive with glucose up to 700s so he was transferred to ER. In ER he remained hyperglycemic and was provided IVF resuscitation. He developed hypotension and was started on vasoactive infusion. Concern for sepsis with recent UTI and treated with ABX. Due to pressors PCCM consulted for admission. Of note he is DNR/DNI  SUBJECTIVE:  Pt weaned off levophed.  Run of VT overnight, resolved spontaneously. Pt more alert this am.     VITAL SIGNS: BP 104/73   Pulse 86   Temp 97.6 F (36.4 C) (Oral)   Resp 13   Ht 6' (1.829 m)   Wt 188 lb 15 oz (85.7 kg)   SpO2 99%   BMI 25.62 kg/m   HEMODYNAMICS:    VENTILATOR SETTINGS:    INTAKE / OUTPUT: I/O last 3 completed shifts: In: 1350.8 [I.V.:1000.8; IV Piggyback:350] Out: 650 [Urine:650]  PHYSICAL EXAMINATION: General:  Frail elderly male in NAD lying in bed Neuro:  Opens eyes to voice, answers to his name, yes/no to questions  HEENT:  [place]/AT, PERRL, no JVD Cardiovascular:  RRR, no MRG Lungs:  Clear bilateral breath sounds Abdomen:  Soft, non-distended Musculoskeletal:  No acute deformity Skin:  Grossly  intact  LABS:  BMET  Recent Labs Lab 02/29/16 0225 03/01/16 0410  NA 144 147*  K 4.1 3.6  CL 113* 118*  CO2 19* 23  BUN 49* 21*  CREATININE 2.99* 1.37*  GLUCOSE 556* 195*    Electrolytes  Recent Labs Lab 02/29/16 0225 02/29/16 2016 03/01/16 0410  CALCIUM 9.8  --  8.6*  MG  --  2.1  --     CBC  Recent Labs Lab 02/29/16 0225 03/01/16 0410  WBC 13.6* 11.2*  HGB 15.0 13.5  HCT 44.5 40.5  PLT 305 261    Coag's  Recent Labs Lab 02/29/16 0225  INR 0.96    Sepsis Markers  Recent Labs Lab 02/29/16 0241 02/29/16 0538 02/29/16 0548  LATICACIDVEN 3.97*  --  2.71*  PROCALCITON  --  0.14  --     ABG  Recent Labs Lab 02/29/16 0500  PHART 7.385  PCO2ART 32.5  PO2ART 81.6*    Liver Enzymes  Recent Labs Lab 02/29/16 0225  AST 25  ALT 52  ALKPHOS 97  BILITOT 0.6  ALBUMIN 4.2    Cardiac Enzymes  Recent Labs Lab 02/29/16 0225  TROPONINI 0.17*    Glucose  Recent Labs Lab 02/29/16 1124 02/29/16 1521 02/29/16 2034 03/01/16 0044 03/01/16 0429 03/01/16 0756  GLUCAP 328* 240* 132* 146* 196* 197*    Imaging No results found.   STUDIES:  CULTURES: Blood 9/14 >> Urine 9/14 >>  ANTIBIOTICS: Zosyn 9/14 >>  Vancomycin 9/14 >> 9/15  SIGNIFICANT EVENTS: 9/14  Admit with AMS, Shock 9/15  Shock resolved, off vasopressors.  Tx to med-surg  LINES/TUBES:   DISCUSSION: 80 year old male with Alzheimer's with recent admit for UTI. Wheelchair bound in SNF. Now presenting with AMS, hyperglycemia, and hypotension on levo. Admited to ICU. Conservative management from here on out. No lines/tubes. DNR/DNI. Vasopressors weaned off with volume resuscitation. If pt were to decline, wife would consider comfort care.   ASSESSMENT / PLAN:  PULMONARY A: Inability to protect airway in setting of encephalopathy - resolved P:   Supplemental O2 to keep sats > 92% Pulmonary hygiene  DNI  CARDIOVASCULAR A:  Shock, hypovolemic vs septic  (doubt) - resolved with IVF, suspect hypovolemic shock Chronic hypotension with SBP 90s - low 100's.  Elevated lactic Elevated troponin. Suspect demand, no ischemia on EKG  VT - short run VT overnight 9/14, resolved spontaneously, lytes wnl P:  Telemetry monitoring Tolerate MAP > 87mmHg Levophed cap 65mcg >> off No CVL per discussion with wife Manage conservatively DNR Hold home antihypertensives, may not need to go back on them at d/c given low BP's   RENAL A:   Acute kidney injury in setting hypovolemia Chronic kidney disease P:   Trend BMP / UOP  Replace electrolytes as indicated  GASTROINTESTINAL A:   No acute issues P:   IV pepcid for SUP NPO  HEMATOLOGIC A:   No acute issues P:  Follow CBC  INFECTIOUS A:   Hypotension / Shock - ? Sepsis, no clear source. Urine looks OK.  Doubt infectious etiology as resolved so quickly with volume resuscitation / control of glucose.  Leukocytosis potentially stress response P:   Continue broad ABX as above Follow cultures D/C vancomycin 9/15  Consider d/c zosyn 9/16 (3 days abx)  ENDOCRINE A:   HHNK  DM   P:   CBG monitoring and SSI Hold home metformin   NEUROLOGIC A:   Acute metabolic encephalopathy Dementia - lives in SNF, essentially bed bound  P:   Monitor / supportive care  Resume namenda   FAMILY  - Updates: wife updated. Wants to be conservative in managing. Nothing invasive.   - Inter-disciplinary family meet or Palliative Care meeting due by:  9/21    Transfer to med-surg & to [facility] for further management.   Noe Gens, NP-C [facility] Pgr: [phone] or if no answer [phone] 03/01/2016, 9:03 AM  Attending note: I have seen and examined the patient with nurse practitioner/resident and agree with the note. History, labs and imaging reviewed.  80 Y/O with alzheimers, dementia, CKD, Prostate cancer, recent admit for UTI. Admitted today AM for altered mental status, shock,  hyperglycemia.  Did well overnight. Off pressors, more awake He continues on broad spectrum antibiotics.  Low threshold to stop as there is no obvious source of infection and Pct is low.  Stable to transfer out of ICU.  Marshell Garfinkel MD [place] [facility] Pager [phone] If no answer or after 3pm call: [phone] 03/01/2016, 1:39 PM

## 2016-03-01 NOTE — Progress Notes (Signed)
Pt had episode of emesis at 1025. Emesis appeared to be undigested food. Oxygen saturation maintained on room air during and after emesis episode. Lung sounds reassessed and sound clear/diminished. RN notified Noe Gens, NP. Will continue to monitor.

## 2016-03-01 NOTE — Progress Notes (Signed)
RN reports pt vomited after breakfast.  No coughing with eating and sats remain unchanged.  Pt in no distress.    Plan: PRN zofran  Change diet to clear liquid If further vomiting, consider KUB   Noe Gens, NP-C [place] Pgr: [phone] or if no answer [phone] 03/01/2016, 10:31 AM

## 2016-03-01 NOTE — Progress Notes (Signed)
Pt transferred to room 1514, by RN and CNA at 1100.

## 2016-03-02 DIAGNOSIS — E43 Unspecified severe protein-calorie malnutrition: Secondary | ICD-10-CM

## 2016-03-02 DIAGNOSIS — F0390 Unspecified dementia without behavioral disturbance: Secondary | ICD-10-CM

## 2016-03-02 DIAGNOSIS — E870 Hyperosmolality and hypernatremia: Secondary | ICD-10-CM

## 2016-03-02 DIAGNOSIS — G92 Toxic encephalopathy: Secondary | ICD-10-CM

## 2016-03-02 LAB — CBC
HCT: 38.6 % — ABNORMAL LOW (ref 39.0–52.0)
HEMOGLOBIN: 12.9 g/dL — AB (ref 13.0–17.0)
MCH: 30.3 pg (ref 26.0–34.0)
MCHC: 33.4 g/dL (ref 30.0–36.0)
MCV: 90.6 fL (ref 78.0–100.0)
PLATELETS: 221 10*3/uL (ref 150–400)
RBC: 4.26 MIL/uL (ref 4.22–5.81)
RDW: 14.2 % (ref 11.5–15.5)
WBC: 6.6 10*3/uL (ref 4.0–10.5)

## 2016-03-02 LAB — BASIC METABOLIC PANEL
ANION GAP: 5 (ref 5–15)
BUN: 13 mg/dL (ref 6–20)
CO2: 27 mmol/L (ref 22–32)
Calcium: 8.6 mg/dL — ABNORMAL LOW (ref 8.9–10.3)
Chloride: 114 mmol/L — ABNORMAL HIGH (ref 101–111)
Creatinine, Ser: 1.26 mg/dL — ABNORMAL HIGH (ref 0.61–1.24)
GFR, EST NON AFRICAN AMERICAN: 53 mL/min — AB (ref >60)
GLUCOSE: 190 mg/dL — AB (ref 65–99)
POTASSIUM: 3.6 mmol/L (ref 3.5–5.1)
SODIUM: 146 mmol/L — AB (ref 135–145)

## 2016-03-02 LAB — GLUCOSE, CAPILLARY
GLUCOSE-CAPILLARY: 161 mg/dL — AB (ref 65–99)
GLUCOSE-CAPILLARY: 196 mg/dL — AB (ref 65–99)
GLUCOSE-CAPILLARY: 223 mg/dL — AB (ref 65–99)
Glucose-Capillary: 185 mg/dL — ABNORMAL HIGH (ref 65–99)
Glucose-Capillary: 213 mg/dL — ABNORMAL HIGH (ref 65–99)

## 2016-03-02 MED ORDER — SODIUM CHLORIDE 0.45 % IV SOLN
INTRAVENOUS | Status: AC
  Administered 2016-03-02: 14:00:00 via INTRAVENOUS

## 2016-03-02 MED ORDER — INSULIN ASPART 100 UNIT/ML ~~LOC~~ SOLN
0.0000 [IU] | Freq: Three times a day (TID) | SUBCUTANEOUS | Status: DC
  Administered 2016-03-02: 7 [IU] via SUBCUTANEOUS
  Administered 2016-03-03: 15 [IU] via SUBCUTANEOUS
  Administered 2016-03-03: 11 [IU] via SUBCUTANEOUS
  Administered 2016-03-03 – 2016-03-04 (×2): 7 [IU] via SUBCUTANEOUS
  Administered 2016-03-04: 15 [IU] via SUBCUTANEOUS

## 2016-03-02 NOTE — Evaluation (Signed)
Clinical/Bedside Swallow Evaluation Patient Details  Name: Derrick Rubio MRN: [id number] Date of Birth: [birth year]  Today's Date: 03/02/2016 Time: SLP Start Time (ACUTE ONLY): 1501 SLP Stop Time (ACUTE ONLY): 1522 SLP Time Calculation (min) (ACUTE ONLY): 21 min  Past Medical History:  Past Medical History:  Diagnosis Date  . CKD (chronic kidney disease)   . Dementia   . 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 Diabetes mellitus without complication ([place])   . DVT (deep venous thrombosis) ([place]) 09/16/2013  . Glaucoma   . Latent tuberculosis 09/16/2013  . Osteoarthritis 09/16/2013  . Prostate cancer ([place]) 2006   s/p seed radiation  . Pure hypercholesterolemia 10/18/2013   Past Surgical History:  Past Surgical History:  Procedure Laterality Date  . COLONOSCOPY WITH PROPOFOL N/A 01/24/2014   Procedure: COLONOSCOPY WITH PROPOFOL;  Surgeon: Garlan Fair, MD;  Location: [facility];  Service: Endoscopy;  Laterality: N/A;  . ivc filter placement     HPI:  80 year old male with PMH as below, which is significant for Alzheimer's dementia, CKD, DM, latent TB and prostate Ca. He has been in SNF for a few years now due to Alzheimer's and is wheelchair bound as he no longer walks. His wife states that he can recognize family members, but is very limited with his ADLs. He was recently admitted for UTI and was treated with ABX. Wife reports he has never really gotten back to baseline since that admission. He was doing OK for a couple of days, but then grew progressively drowsy to the point he was not waking up for meals. 9/14 he was essentially unresponsive with glucose up to 700s so he was transferred to ER. In ER he remained hyperglycemic and was provided IVF resuscitation. He developed hypotension and was started on vasoactive infusion. Concern for sepsis with recent UTI and treated with ABX. Due to pressors PCCM consulted for admission. Of note he is DNR/DNI; nursing upgraded diet to carb modified per  family request; wife stated he has been on a puree diet for last month at SNF d/t pocketing solids.   Assessment / Plan / Recommendation Clinical Impression  Pt presents with a primarily cognitive based oral dysphagia. Spouse present who reports previously at SNF pt was on a puree and thin liquid diet. Pt with lethargic yet arousable for brief periods of PO trials with maximal cueing. Note intermittent oral holding of bolus and suspected delay in swallow initiation across all consistencies. Despite poor cognitive status, pt appeared to exhibit adequate airway protection. No overt signs or symptoms of aspiration with any PO. Recommend resume dysphagia 1 (puree) and thin liquid diet with medicines crushed in puree. Hold PO's if extreme lethargy. ST to follow up for diet tolerance.      Aspiration Risk  Mild aspiration risk;Moderate aspiration risk    Diet Recommendation     Medication Administration: Crushed with puree    Other  Recommendations Oral Care Recommendations: Oral care BID   Follow up Recommendations Skilled Nursing facility      Frequency and Duration min 2x/week  1 week       Prognosis Prognosis for Safe Diet Advancement: Fair Barriers to Reach Goals: Severity of deficits      Swallow Study   General Date of Onset: 02/29/16 HPI: 80 year old male with PMH as below, which is significant for Alzheimer's dementia, CKD, DM, latent TB and prostate Ca. He has been in SNF for a few years now due to Alzheimer's and is  wheelchair bound as he no longer walks. His wife states that he can recognize family members, but is very limited with his ADLs. He was recently admitted for UTI and was treated with ABX. Wife reports he has never really gotten back to baseline since that admission. He was doing OK for a couple of days, but then grew progressively drowsy to the point he was not waking up for meals. 9/14 he was essentially unresponsive with glucose up to 700s so he was transferred to ER.  In ER he remained hyperglycemic and was provided IVF resuscitation. He developed hypotension and was started on vasoactive infusion. Concern for sepsis with recent UTI and treated with ABX. Due to pressors PCCM consulted for admission. Of note he is DNR/DNI Type of Study: Bedside Swallow Evaluation Previous Swallow Assessment: none on file Diet Prior to this Study: Regular;Thin liquids Temperature Spikes Noted: No Respiratory Status: Room air History of Recent Intubation: No Behavior/Cognition: Lethargic/Drowsy;Requires cueing Oral Cavity Assessment: Within Functional Limits Oral Care Completed by SLP: No Oral Cavity - Dentition: Poor condition;Missing dentition Self-Feeding Abilities: Needs assist;Needs set up Patient Positioning: Upright in bed Baseline Vocal Quality: Low vocal intensity Volitional Cough: Cognitively unable to elicit Volitional Swallow: Unable to elicit    Oral/Motor/Sensory Function Overall Oral Motor/Sensory Function: Generalized oral weakness Facial Symmetry: Within Functional Limits   Ice Chips Ice chips: Not tested   Thin Liquid Thin Liquid: Impaired Presentation: Straw;Cup Oral Phase Impairments: Poor awareness of bolus Oral Phase Functional Implications: Oral holding;Prolonged oral transit Pharyngeal  Phase Impairments: Suspected delayed Swallow    Nectar Thick Nectar Thick Liquid: Not tested   Honey Thick Honey Thick Liquid: Not tested   Puree Puree: Impaired Presentation: Spoon Oral Phase Impairments: Reduced lingual movement/coordination Oral Phase Functional Implications: Prolonged oral transit;Oral holding Pharyngeal Phase Impairments: Suspected delayed Swallow   Solid      Solid: Impaired Presentation: Spoon Oral Phase Impairments: Reduced lingual movement/coordination;Poor awareness of bolus;Impaired mastication Oral Phase Functional Implications: Oral holding Pharyngeal Phase Impairments: Suspected delayed Swallow;Other (comments) (oral  expulsion needed)    Functional Assessment Tool Used: NOMS Functional Limitations: Swallowing Swallow Current Status [id number]): At least 40 percent but less than 60 percent impaired, limited or restricted Swallow Goal Status [phone]): At least 40 percent but less than 60 percent impaired, limited or restricted   Nabeel Gladson,PAT, M.S., CCC-SLP 03/02/2016,4:39 PM

## 2016-03-02 NOTE — Progress Notes (Signed)
PROGRESS NOTE    Derrick Rubio  [id number]  DOB: 01-Jun-1936  DOA: 02/29/2016 PCP: Blanchie Serve, MD Outpatient Specialists:  Hospital course: 80 year old male with PMH as below, which is significant for Alzheimer's dementia, CKD, DM, latent TB and prostate Ca. He has been in SNF for a few years now due to Alzheimer's and is wheelchair bound as he no longer walks. His wife states that he can recognize family members, but is very limited with his ADLs. He was recently admitted for UTI and was treated with ABX. Wife reports he has never really gotten back to baseline since that admission. He was doing OK for a couple of days, but then grew progressively drowsy to the point he was not waking up for meals. 9/14 he was essentially unresponsive with glucose up to 700s so he was transferred to ER. In ER he remained hyperglycemic and was provided IVF resuscitation. He developed hypotension and was started on vasoactive infusion. Concern for sepsis with recent UTI and treated with ABX. Due to pressors PCCM consulted for admission. Of note he is DNR/DNI  Assessment & Plan:   Sepsis, unclear source - doubt infectious etiology as resolved quickly with volume resuscitation and control of hyperglycemia- cultures no growth to date, will dc zosyn after today.  This will be 3 days total of antibiotics.    Hyperglycemia - much improved with current therapy  Acute metabolic encephalopathy - clinically slowly improving.  Continue to monitor closely.    Dementia - stable per wife   Acute on chronic renal failure - improving to baseline with fluid hydration.   Anemia chronic disease - stable, following   Hypernatremia - changing IVF to 1/2 NS 9/16  Plan to repeat BMP in AM.   DVT prophylaxis: heparin Code Status: DNR Disposition Plan: TBD  Subjective: No acute changes.    Objective: Vitals:   03/01/16 1100 03/01/16 1727 03/01/16 2037 03/02/16 0447  BP:  118/82 119/85 (!) 114/58  Pulse:  76  75 70  Resp: 15 16 17 18   Temp:  98.1 F (36.7 C) 97.5 F (36.4 C) 97.6 F (36.4 C)  TempSrc:  Axillary Axillary Axillary  SpO2: 95%  99% 100%  Weight:      Height:        Intake/Output Summary (Last 24 hours) at 03/02/16 1238 Last data filed at 03/02/16 1200  Gross per 24 hour  Intake             1296 ml  Output                0 ml  Net             1296 ml   Filed Weights   02/29/16 0654 03/01/16 0438  Weight: 85.7 kg (188 lb 15 oz) 85.7 kg (188 lb 15 oz)    Exam:  General:  Frail elderly male in NAD lying in bed Neuro:  Opens eyes to voice, answers to his name, yes/no to questions  HEENT:  [place]/AT, PERRL, no JVD Cardiovascular:  RRR, no MRG Lungs:  Clear bilateral breath sounds Abdomen:  Soft, non-distended Musculoskeletal:  No acute deformity Skin:  Grossly intact  Data Reviewed: Basic Metabolic Panel:  Recent Labs Lab 02/29/16 0225 02/29/16 2016 03/01/16 0410 03/02/16 0539  NA 144  --  147* 146*  K 4.1  --  3.6 3.6  CL 113*  --  118* 114*  CO2 19*  --  23 27  GLUCOSE 556*  --  195* 190*  BUN 49*  --  21* 13  CREATININE 2.99*  --  1.37* 1.26*  CALCIUM 9.8  --  8.6* 8.6*  MG  --  2.1  --   --    Liver Function Tests:  Recent Labs Lab 02/29/16 0225  AST 25  ALT 52  ALKPHOS 97  BILITOT 0.6  PROT 8.4*  ALBUMIN 4.2   No results for input(s): LIPASE, AMYLASE in the last 168 hours.  Recent Labs Lab 02/29/16 0538  AMMONIA 13   CBC:  Recent Labs Lab 02/29/16 0225 03/01/16 0410 03/02/16 0539  WBC 13.6* 11.2* 6.6  NEUTROABS 11.6*  --   --   HGB 15.0 13.5 12.9*  HCT 44.5 40.5 38.6*  MCV 91.4 92.3 90.6  PLT 305 261 221   Cardiac Enzymes:  Recent Labs Lab 02/29/16 0225  TROPONINI 0.17*   CBG (last 3)   Recent Labs  03/02/16 0455 03/02/16 0758 03/02/16 1136  GLUCAP 161* 196* 185*   Recent Results (from the past 240 hour(s))  Blood culture (routine x 2)     Status: None (Preliminary result)   Collection Time: 02/29/16  3:01 AM   Result Value Ref Range Status   Specimen Description BLOOD LEFT WRIST  Final   Special Requests BOTTLES DRAWN AEROBIC AND ANAEROBIC 3ML AND 5ML  Final   Culture   Final    NO GROWTH 2 DAYS Performed at [facility]    Report Status PENDING  Incomplete  Urine culture     Status: None   Collection Time: 02/29/16  3:05 AM  Result Value Ref Range Status   Specimen Description URINE, CLEAN CATCH  Final   Special Requests NONE  Final   Culture NO GROWTH Performed at [facility]   Final   Report Status 03/01/2016 FINAL  Final  Blood culture (routine x 2)     Status: None (Preliminary result)   Collection Time: 02/29/16  3:18 AM  Result Value Ref Range Status   Specimen Description BLOOD RIGHT WRIST  Final   Special Requests IN PEDIATRIC BOTTLE 4ML  Final   Culture   Final    NO GROWTH 2 DAYS Performed at [facility]    Report Status PENDING  Incomplete  MRSA PCR Screening     Status: None   Collection Time: 02/29/16  7:03 AM  Result Value Ref Range Status   MRSA by PCR NEGATIVE NEGATIVE Final    Comment:        The GeneXpert MRSA Assay (FDA approved for NASAL specimens only), is one component of a comprehensive MRSA colonization surveillance program. It is not intended to diagnose MRSA infection nor to guide or monitor treatment for MRSA infections.     Studies: No results found.  Scheduled Meds: . chlorhexidine  15 mL Mouth Rinse BID  . famotidine (PEPCID) IV  20 mg Intravenous Q24H  . heparin  5,000 Units Subcutaneous Q8H  . insulin aspart  0-20 Units Subcutaneous Q4H  . latanoprost  1 drop Both Eyes QHS  . mouth rinse  15 mL Mouth Rinse q12n4p  . memantine  28 mg Oral Daily  . piperacillin-tazobactam (ZOSYN)  IV  3.375 g Intravenous Q8H  . saccharomyces boulardii  250 mg Oral BID  . tamsulosin  0.4 mg Oral Daily   Continuous Infusions: . sodium chloride 40 mL/hr at 03/02/16 [id number]   Active Problems:   Hypovolemic shock (HCC)    Protein-calorie malnutrition, severe  Time  spent:   Irwin Brakeman, MD, FAAFP [facility] Pager [phone]  If 7PM-7AM, please contact night-coverage [email or web address] Password [email or web address] 03/02/2016, 12:38 PM    LOS: 2 days

## 2016-03-03 DIAGNOSIS — E860 Dehydration: Secondary | ICD-10-CM

## 2016-03-03 DIAGNOSIS — A419 Sepsis, unspecified organism: Principal | ICD-10-CM

## 2016-03-03 DIAGNOSIS — R739 Hyperglycemia, unspecified: Secondary | ICD-10-CM

## 2016-03-03 LAB — GLUCOSE, CAPILLARY
GLUCOSE-CAPILLARY: 243 mg/dL — AB (ref 65–99)
GLUCOSE-CAPILLARY: 308 mg/dL — AB (ref 65–99)
Glucose-Capillary: 277 mg/dL — ABNORMAL HIGH (ref 65–99)
Glucose-Capillary: 186 mg/dL — ABNORMAL HIGH (ref 65–99)

## 2016-03-03 LAB — BASIC METABOLIC PANEL
Anion gap: 7 (ref 5–15)
BUN: 10 mg/dL (ref 6–20)
CALCIUM: 8.8 mg/dL — AB (ref 8.9–10.3)
CO2: 25 mmol/L (ref 22–32)
Chloride: 111 mmol/L (ref 101–111)
Creatinine, Ser: 1.25 mg/dL — ABNORMAL HIGH (ref 0.61–1.24)
GFR calc Af Amer: >60 mL/min (ref >60)
GFR, EST NON AFRICAN AMERICAN: 53 mL/min — AB (ref >60)
GLUCOSE: 278 mg/dL — AB (ref 65–99)
Potassium: 4.3 mmol/L (ref 3.5–5.1)
Sodium: 143 mmol/L (ref 135–145)

## 2016-03-03 LAB — CBC
HEMATOCRIT: 37.1 % — AB (ref 39.0–52.0)
Hemoglobin: 12.3 g/dL — ABNORMAL LOW (ref 13.0–17.0)
MCH: 30.5 pg (ref 26.0–34.0)
MCHC: 33.2 g/dL (ref 30.0–36.0)
MCV: 92.1 fL (ref 78.0–100.0)
Platelets: 198 10*3/uL (ref 150–400)
RBC: 4.03 MIL/uL — ABNORMAL LOW (ref 4.22–5.81)
RDW: 13.9 % (ref 11.5–15.5)
WBC: 6.3 10*3/uL (ref 4.0–10.5)

## 2016-03-03 MED ORDER — INSULIN ASPART 100 UNIT/ML ~~LOC~~ SOLN
3.0000 [IU] | Freq: Three times a day (TID) | SUBCUTANEOUS | Status: DC
  Administered 2016-03-03 – 2016-03-04 (×2): 3 [IU] via SUBCUTANEOUS

## 2016-03-03 MED ORDER — INSULIN GLARGINE 100 UNIT/ML ~~LOC~~ SOLN
8.0000 [IU] | Freq: Every day | SUBCUTANEOUS | Status: DC
  Administered 2016-03-03 – 2016-03-04 (×2): 8 [IU] via SUBCUTANEOUS
  Filled 2016-03-03 (×2): qty 0.08

## 2016-03-03 NOTE — Progress Notes (Signed)
PT Cancellation Note  Patient Details Name: Franciso Dingus MRN: [id number] DOB: [birth year]   Cancelled Treatment:    Reason Eval/Treat Not Completed: PT screened, no needs identified, will sign off (patient is from long term care and total care/ WC  bound PTA.)   Claretha Cooper 03/03/2016, 12:03 PM Tresa Endo PT [phone]

## 2016-03-03 NOTE — Progress Notes (Signed)
PROGRESS NOTE   Derrick Rubio  [id number]  DOB: 16-Jun-1936  DOA: 02/29/2016 PCP: Blanchie Serve, MD Outpatient Specialists:  Hospital course: 80 year old male with PMH as below, which is significant for Alzheimer's dementia, CKD, DM, latent TB and prostate Ca. He has been in SNF for a few years now due to Alzheimer's and is wheelchair bound as he no longer walks. His wife states that he can recognize family members, but is very limited with his ADLs. He was recently admitted for UTI and was treated with ABX. Wife reports he has never really gotten back to baseline since that admission. He was doing OK for a couple of days, but then grew progressively drowsy to the point he was not waking up for meals. 9/14 he was essentially unresponsive with glucose up to 700s so he was transferred to ER. In ER he remained hyperglycemic and was provided IVF resuscitation. He developed hypotension and was started on vasoactive infusion. Concern for sepsis with recent UTI and treated with ABX.  Due to being on pressors PCCM consulted for admission. Of note he is DNR/DNI  Assessment & Plan:   Sepsis, unclear source - doubt infectious etiology as resolved quickly with volume resuscitation and control of hyperglycemia- cultures no growth to date, will dc zosyn after today.  This will be 3 days total of antibiotics.    Type 2 diabetes mellitus, insulin requiring with Hyperglycemia - much improved with current therapy, DC metformin, would not restart at discharge.  He needs insulin.  Continue sliding scale coverage and adding lantus 8 units every morning with breakfast 9/17.  He will need specific order at discharge for them to check blood glucose 3-4 times per day.    CBG (last 3)   Recent Labs  03/02/16 1727 03/02/16 2200 03/03/16 0804  GLUCAP 213* 223* 243*   Severe dehydration - Much improved with aggressive rehydration with oral and IVF therapy.  He will need specific order to give water every 4-6  hours at discharge otherwise he will be dehydrated again.    Dysphagia - Appreciate assistance of SLP.  Continue dysphagia 1 (puree) and thin liquid diet with medicines crushed in puree.   Acute metabolic encephalopathy - clinically improving with supportive therapy.  Wife says he has been talking and making jokes with her.  Dementia - stable per wife.  He is at his baseline.    Acute on chronic renal failure - improving to baseline with fluid hydration. I decided to stop metformin as he is not really drinking well.  He needs a specific order to be written at discharge for them to give him water every 4 hours or he will become dehydrated again within a couple of days.    Anemia chronic disease - stable hemoglobin, following   Hypernatremia - improved after we changed IVF to 1/2 NS on 9/16.    DVT prophylaxis: heparin Code Status: DNR Disposition Plan: Plan to discharge back to SNF 9/18 if remains stable  Subjective: Pt much more alert and talkative today.     Objective: Vitals:   03/02/16 0447 03/02/16 1500 03/02/16 2225 03/03/16 0620  BP: (!) 114/58 (!) 138/98 117/68 103/66  Pulse: 70 64 66 68  Resp: 18 18 19 19   Temp: 97.6 F (36.4 C) 97.6 F (36.4 C) 98.2 F (36.8 C) 97.4 F (36.3 C)  TempSrc: Axillary Oral Axillary Axillary  SpO2: 100% 100% 100% 98%  Weight:      Height:  Intake/Output Summary (Last 24 hours) at 03/03/16 1006 Last data filed at 03/03/16 0602  Gross per 24 hour  Intake             2055 ml  Output                0 ml  Net             2055 ml   Filed Weights   02/29/16 0654 03/01/16 0438  Weight: 85.7 kg (188 lb 15 oz) 85.7 kg (188 lb 15 oz)    Exam:  General:  Frail elderly male in NAD lying in bed, awake, cooperative, alert. Neuro:  Opens eyes to voice, answers to his name, yes/no to questions  HEENT:  [place]/AT, PERRL, no JVD Cardiovascular:  RRR, no MRG Lungs:  Clear bilateral breath sounds Abdomen:  Soft, non-distended normal  BS Musculoskeletal:  No acute deformity, mild edema in the extremities. Skin:  Grossly intact  Data Reviewed: Basic Metabolic Panel:  Recent Labs Lab 02/29/16 0225 02/29/16 2016 03/01/16 0410 03/02/16 0539 03/03/16 0526  NA 144  --  147* 146* 143  K 4.1  --  3.6 3.6 4.3  CL 113*  --  118* 114* 111  CO2 19*  --  23 27 25   GLUCOSE 556*  --  195* 190* 278*  BUN 49*  --  21* 13 10  CREATININE 2.99*  --  1.37* 1.26* 1.25*  CALCIUM 9.8  --  8.6* 8.6* 8.8*  MG  --  2.1  --   --   --    Liver Function Tests:  Recent Labs Lab 02/29/16 0225  AST 25  ALT 52  ALKPHOS 97  BILITOT 0.6  PROT 8.4*  ALBUMIN 4.2   No results for input(s): LIPASE, AMYLASE in the last 168 hours.  Recent Labs Lab 02/29/16 0538  AMMONIA 13   CBC:  Recent Labs Lab 02/29/16 0225 03/01/16 0410 03/02/16 0539 03/03/16 0526  WBC 13.6* 11.2* 6.6 6.3  NEUTROABS 11.6*  --   --   --   HGB 15.0 13.5 12.9* 12.3*  HCT 44.5 40.5 38.6* 37.1*  MCV 91.4 92.3 90.6 92.1  PLT 305 261 221 198   Cardiac Enzymes:  Recent Labs Lab 02/29/16 0225  TROPONINI 0.17*   CBG (last 3)   Recent Labs  03/02/16 1727 03/02/16 2200 03/03/16 0804  GLUCAP 213* 223* 243*   Recent Results (from the past 240 hour(s))  Blood culture (routine x 2)     Status: None (Preliminary result)   Collection Time: 02/29/16  3:01 AM  Result Value Ref Range Status   Specimen Description BLOOD LEFT WRIST  Final   Special Requests BOTTLES DRAWN AEROBIC AND ANAEROBIC 3ML AND 5ML  Final   Culture   Final    NO GROWTH 2 DAYS Performed at [facility]    Report Status PENDING  Incomplete  Urine culture     Status: None   Collection Time: 02/29/16  3:05 AM  Result Value Ref Range Status   Specimen Description URINE, CLEAN CATCH  Final   Special Requests NONE  Final   Culture NO GROWTH Performed at [facility]   Final   Report Status 03/01/2016 FINAL  Final  Blood culture (routine x 2)     Status: None  (Preliminary result)   Collection Time: 02/29/16  3:18 AM  Result Value Ref Range Status   Specimen Description BLOOD RIGHT WRIST  Final   Special  Requests IN PEDIATRIC BOTTLE 4ML  Final   Culture   Final    NO GROWTH 2 DAYS Performed at [facility]    Report Status PENDING  Incomplete  MRSA PCR Screening     Status: None   Collection Time: 02/29/16  7:03 AM  Result Value Ref Range Status   MRSA by PCR NEGATIVE NEGATIVE Final    Comment:        The GeneXpert MRSA Assay (FDA approved for NASAL specimens only), is one component of a comprehensive MRSA colonization surveillance program. It is not intended to diagnose MRSA infection nor to guide or monitor treatment for MRSA infections.     Studies: No results found.  Scheduled Meds: . chlorhexidine  15 mL Mouth Rinse BID  . famotidine (PEPCID) IV  20 mg Intravenous Q24H  . heparin  5,000 Units Subcutaneous Q8H  . insulin aspart  0-20 Units Subcutaneous TID WC  . insulin glargine  8 Units Subcutaneous BH-q7a  . latanoprost  1 drop Both Eyes QHS  . mouth rinse  15 mL Mouth Rinse q12n4p  . memantine  28 mg Oral Daily  . tamsulosin  0.4 mg Oral Daily   Continuous Infusions:   Active Problems:   Hypovolemic shock (HCC)   Protein-calorie malnutrition, severe  Time spent:   Irwin Brakeman, MD, FAAFP [facility] Pager [phone]  If 7PM-7AM, please contact night-coverage [email or web address] Password [email or web address] 03/03/2016, 10:06 AM    LOS: 3 days

## 2016-03-04 DIAGNOSIS — G9340 Encephalopathy, unspecified: Secondary | ICD-10-CM

## 2016-03-04 DIAGNOSIS — G309 Alzheimer's disease, unspecified: Secondary | ICD-10-CM

## 2016-03-04 DIAGNOSIS — N189 Chronic kidney disease, unspecified: Secondary | ICD-10-CM

## 2016-03-04 DIAGNOSIS — N179 Acute kidney failure, unspecified: Secondary | ICD-10-CM

## 2016-03-04 DIAGNOSIS — E1165 Type 2 diabetes mellitus with hyperglycemia: Secondary | ICD-10-CM

## 2016-03-04 LAB — GLUCOSE, CAPILLARY
GLUCOSE-CAPILLARY: 302 mg/dL — AB (ref 65–99)
Glucose-Capillary: 207 mg/dL — ABNORMAL HIGH (ref 65–99)

## 2016-03-04 MED ORDER — INSULIN GLARGINE 100 UNIT/ML ~~LOC~~ SOLN: 10.0000 [IU] | Freq: Every day | SUBCUTANEOUS | 0 refills | Status: DC

## 2016-03-04 MED ORDER — INSULIN ASPART 100 UNIT/ML ~~LOC~~ SOLN: 3.0000 [IU] | Freq: Three times a day (TID) | SUBCUTANEOUS | 0 refills | Status: DC

## 2016-03-04 MED ORDER — SENNA-DOCUSATE SODIUM 8.6-50 MG PO TABS: 1.0000 | ORAL_TABLET | Freq: Two times a day (BID) | ORAL | Status: DC | PRN

## 2016-03-04 NOTE — Progress Notes (Signed)
Report called to Raymond at [facility].

## 2016-03-04 NOTE — Progress Notes (Signed)
CSW assisting with d/c planning. Pt is ready to return to [facility] today. Pt / spouse are in agreement with this plan. [facility] transport is required. Medical necessity form completed. D/C summary sent to SNF for review. No scripts printed. # for report provided to nsg.  Werner Lean LCSW [phone]

## 2016-03-04 NOTE — Progress Notes (Signed)
SLP Cancellation Note  Patient Details Name: Derrick Rubio MRN: [id number] DOB: 02-01-1936   Cancelled treatment:       Reason Eval/Treat Not Completed: Other (comment) (pt discharging, RN reports good tolerance of po intake, per chart review, pt on dys1/thin diet prior to admit)   [name], [place] [facility] [place] [phone]

## 2016-03-04 NOTE — Progress Notes (Signed)
Date: March 04, 2016 Discharge orders checked for needs. No needs present at time of discharge. Velva Harman, RN, BSN, [place]   [phone]

## 2016-03-04 NOTE — Discharge Instructions (Signed)
Please assist patient to drink 200 mL of water 4 times per day.    Please check blood sugar 4 times per day AC and HS.  Adjust insulin doses as needed to control blood sugars.  Please check CBC and BMP in 1 week    Acute Kidney Injury Acute kidney injury is any condition in which there is sudden (acute) damage to the kidneys. Acute kidney injury was previously known as acute kidney failure or acute renal failure. The kidneys are two organs that lie on either side of the spine between the middle of the back and the front of the abdomen. The kidneys:  Remove wastes and extra water from the blood.   Produce important hormones. These help keep bones strong, regulate blood pressure, and help create red blood cells.   Balance the fluids and chemicals in the blood and tissues. A small amount of kidney damage may not cause problems, but a large amount of damage may make it difficult or impossible for the kidneys to work the way they should. Acute kidney injury may develop into long-lasting (chronic) kidney disease. It may also develop into a life-threatening disease called end-stage kidney disease. Acute kidney injury can get worse very quickly, so it should be treated right away. Early treatment may prevent other kidney diseases from developing. CAUSES   A problem with blood flow to the kidneys. This may be caused by:   Blood loss.   Heart disease.   Severe burns.   Liver disease.  Direct damage to the kidneys. This may be caused by:  Some medicines.   A kidney infection.   Poisoning or consuming toxic substances.   A surgical wound.   A blow to the kidney area.   A problem with urine flow. This may be caused by:   Cancer.   Kidney stones.   An enlarged prostate. SIGNS AND SYMPTOMS   Swelling (edema) of the legs, ankles, or feet.   Tiredness (lethargy).   Nausea or vomiting.   Confusion.   Problems with urination, such as:   Painful or burning  feeling during urination.   Decreased urine production.   Frequent accidents in children who are potty trained.   Bloody urine.   Muscle twitches and cramps.   Shortness of breath.   Seizures.   Chest pain or pressure. Sometimes, no symptoms are present. DIAGNOSIS Acute kidney injury may be detected and diagnosed by tests, including blood, urine, imaging, or kidney biopsy tests.  TREATMENT Treatment of acute kidney injury varies depending on the cause and severity of the kidney damage. In mild cases, no treatment may be needed. The kidneys may heal on their own. If acute kidney injury is more severe, your health care provider will treat the cause of the kidney damage, help the kidneys heal, and prevent complications from occurring. Severe cases may require a procedure to remove toxic wastes from the body (dialysis) or surgery to repair kidney damage. Surgery may involve:   Repair of a torn kidney.   Removal of an obstruction. HOME CARE INSTRUCTIONS  Follow your prescribed diet.  Take medicines only as directed by your health care provider.  Do not take any new medicines (prescription, over-the-counter, or nutritional supplements) unless approved by your health care provider. Many medicines can worsen your kidney damage or may need to have the dose adjusted.   Keep all follow-up visits as directed by your health care provider. This is important.  Observe your condition to make sure you are  healing as expected. SEEK IMMEDIATE MEDICAL CARE IF:  You are feeling ill or have severe pain in the back or side.   Your symptoms return or you have new symptoms.  You have any symptoms of end-stage kidney disease. These include:   Persistent itchiness.   Loss of appetite.   Headaches.   Abnormally dark or light skin.  Numbness in the hands or feet.   Easy bruising.   Frequent hiccups.   Menstruation stops.   You have a fever.  You have increased  urine production.  You have pain or bleeding when urinating. MAKE SURE YOU:   Understand these instructions.  Will watch your condition.  Will get help right away if you are not doing well or get worse.   This information is not intended to replace advice given to you by your health care provider. Make sure you discuss any questions you have with your health care provider.   Document Released: 12/17/2010 Document Revised: 06/24/2014 Document Reviewed: 01/31/2012 Elsevier Interactive Patient Education 2016 Elsevier Inc.    Hypoglycemia Low blood sugar (hypoglycemia) means that the level of sugar in your blood is lower than it should be. Signs of low blood sugar include:  Getting sweaty.  Feeling hungry.  Feeling dizzy or weak.  Feeling sleepier than normal.  Feeling nervous.  Headaches.  Having a fast heartbeat. Low blood sugar can happen fast and can be an emergency. Your doctor can do tests to check your blood sugar level. You can have low blood sugar and not have diabetes. HOME CARE  Check your blood sugar as told by your doctor. If it is less than 70 mg/dl or as told by your doctor, take 1 of the following:  3 to 4 glucose tablets.   cup clear juice.   cup soda pop, not diet.  1 cup milk.  5 to 6 hard candies.  Recheck blood sugar after 15 minutes. Repeat until it is at the right level.  Eat a snack if it is more than 1 hour until the next meal.  Only take medicine as told by your doctor.  Do not skip meals. Eat on time.  Do not drink alcohol except with meals.  Check your blood glucose before driving.  Check your blood glucose before and after exercise.  Always carry treatment with you, such as glucose pills.  Always wear a medical alert bracelet if you have diabetes. GET HELP RIGHT AWAY IF:   Your blood glucose goes below 70 mg/dl or as told by your doctor, and you:  Are confused.  Are not able to swallow.  Pass out (faint).  You  cannot treat yourself. You may need someone to help you.  You have low blood sugar problems often.  You have problems from your medicines.  You are not feeling better after 3 to 4 days.  You have vision changes. MAKE SURE YOU:   Understand these instructions.  Will watch this condition.  Will get help right away if you are not doing well or get worse.   This information is not intended to replace advice given to you by your health care provider. Make sure you discuss any questions you have with your health care provider.   Document Released: 08/28/2009 Document Revised: 06/24/2014 Document Reviewed: 02/07/2015 Elsevier Interactive Patient Education 2016 Elsevier Inc. Blood Glucose Monitoring, Adult Monitoring your blood glucose (also know as blood sugar) helps you to manage your diabetes. It also helps you and your health care provider  monitor your diabetes and determine how well your treatment plan is working. WHY SHOULD YOU MONITOR YOUR BLOOD GLUCOSE?  It can help you understand how food, exercise, and medicine affect your blood glucose.  It allows you to know what your blood glucose is at any given moment. You can quickly tell if you are having low blood glucose (hypoglycemia) or high blood glucose (hyperglycemia).  It can help you and your health care provider know how to adjust your medicines.  It can help you understand how to manage an illness or adjust medicine for exercise. WHEN SHOULD YOU TEST? Your health care provider will help you decide how often you should check your blood glucose. This may depend on the type of diabetes you have, your diabetes control, or the types of medicines you are taking. Be sure to write down all of your blood glucose readings so that this information can be reviewed with your health care provider. See below for examples of testing times that your health care provider may suggest. Type 1 Diabetes  Test at least 2 times per day if your  diabetes is well controlled, if you are using an insulin pump, or if you perform multiple daily injections.  If your diabetes is not well controlled or if you are sick, you may need to test more often.  It is a good idea to also test:  Before every insulin injection.  Before and after exercise.  Between meals and 2 hours after a meal.  Occasionally between 2:00 a.m. and 3:00 a.m. Type 2 Diabetes  If you are taking insulin, test at least 2 times per day. However, it is best to test before every insulin injection.  If you take medicines by mouth (orally), test 2 times a day.  If you are on a controlled diet, test once a day.  If your diabetes is not well controlled or if you are sick, you may need to monitor more often. HOW TO MONITOR YOUR BLOOD GLUCOSE Supplies Needed  Blood glucose meter.  Test strips for your meter. Each meter has its own strips. You must use the strips that go with your own meter.  A pricking needle (lancet).  A device that holds the lancet (lancing device).  A journal or log book to write down your results. Procedure  Wash your hands with soap and water. Alcohol is not preferred.  Prick the side of your finger (not the tip) with the lancet.  Gently milk the finger until a small drop of blood appears.  Follow the instructions that come with your meter for inserting the test strip, applying blood to the strip, and using your blood glucose meter. Other Areas to Get Blood for Testing Some meters allow you to use other areas of your body (other than your finger) to test your blood. These areas are called alternative sites. The most common alternative sites are:  The forearm.  The thigh.  The back area of the lower leg.  The palm of the hand. The blood flow in these areas is slower. Therefore, the blood glucose values you get may be delayed, and the numbers are different from what you would get from your fingers. Do not use alternative sites if you  think you are having hypoglycemia. Your reading will not be accurate. Always use a finger if you are having hypoglycemia. Also, if you cannot feel your lows (hypoglycemia unawareness), always use your fingers for your blood glucose checks. ADDITIONAL TIPS FOR GLUCOSE MONITORING  Do  not reuse lancets.  Always carry your supplies with you.  All blood glucose meters have a 24-hour "hotline" number to call if you have questions or need help.  Adjust (calibrate) your blood glucose meter with a control solution after finishing a few boxes of strips. BLOOD GLUCOSE RECORD KEEPING It is a good idea to keep a daily record or log of your blood glucose readings. Most glucose meters, if not all, keep your glucose records stored in the meter. Some meters come with the ability to download your records to your home computer. Keeping a record of your blood glucose readings is especially helpful if you are wanting to look for patterns. Make notes to go along with the blood glucose readings because you might forget what happened at that exact time. Keeping good records helps you and your health care provider to work together to achieve good diabetes management.    This information is not intended to replace advice given to you by your health care provider. Make sure you discuss any questions you have with your health care provider.   Document Released: 06/06/2003 Document Revised: 06/24/2014 Document Reviewed: 10/26/2012 Elsevier Interactive Patient Education [employer].

## 2016-03-04 NOTE — Discharge Summary (Signed)
Physician Discharge Summary  Derrick Rubio [id number] DOB: 09-19-1935 DOA: 02/29/2016  PCP: Blanchie Serve, MD  Admit date: 02/29/2016 Discharge date: 03/04/2016  Admitted From: SNF Disposition:  SNF  Discharge Orders:   Please assist patient to drink 200 mL of water 4 times per day.    Please check blood sugar 4 times per day AC and HS.  Adjust insulin doses as needed to control blood sugars.  Please check CBC and BMP in 1 week  Discharge Condition: STABLE CODE STATUS:  DNR Diet recommendation: Recommend resume dysphagia 1 (puree) and thin liquid diet with medicines crushed in puree.    Brief/Interim Summary: Hospital course: 80 year old male with PMH as below, which is significant for Alzheimer's dementia, CKD, DM, latent TB and prostate Ca. He has been in SNF for a few years now due to Alzheimer's and is wheelchair bound as he no longer walks. His wife states that he can recognize family members, but is very limited with his ADLs. He was recently admitted for UTI and was treated with ABX. Wife reports he has never really gotten back to baseline since that admission. He was doing OK for a couple of days, but then grew progressively drowsy to the point he was not waking up for meals. 9/14 he was essentially unresponsive with glucose up to 700s so he was transferred to ER. In ER he remained hyperglycemic and was provided IVF resuscitation. He developed hypotension and was started on vasoactive infusion. Concern for sepsis with recent UTI and treated with ABX.  Due to being on pressors PCCM consulted for admission. Of note he is DNR/DNI  Assessment & Plan:   Sepsis, unclear source - doubt infectious etiology as resolved quickly with volume resuscitation and control of hyperglycemia- cultures no growth to date, will dc zosyn.  This will be 3 days total of antibiotics.    Type 2 diabetes mellitus, insulin requiring with Hyperglycemia - much improved with current therapy, DC  metformin, would not restart at discharge.  He needs insulin. He will be discharged with lantus and novolog with instructions to titrate as needed for blood glucose control.  He will need specific order at discharge for them to check blood glucose 4 times per day.    CBG (last 3)   Recent Labs (last 2 labs)    Recent Labs  03/02/16 1727 03/02/16 2200 03/03/16 0804  GLUCAP 213* 223* 243*     Severe dehydration - Much improved with aggressive rehydration with oral and IVF therapy.  He will need specific order to give water every 4-6 hours at discharge otherwise he will be dehydrated again.    Dysphagia - Appreciate assistance of SLP.  Continue dysphagia 1 (puree) and thin liquid diet with medicines crushed in puree.   Acute metabolic encephalopathy - clinically improving with supportive therapy.  Wife says he has been talking and making jokes with her.   Dementia - stable per wife.  He is at his baseline.    Acute on chronic renal failure - improved to baseline with fluid hydration. I decided to stop metformin as he is not really drinking well and concerned about continuing this with his CKD.  He needs a specific order to be written at discharge for them to give him water every 4-6 hours or he will become dehydrated again within a couple of days.    Anemia chronic disease - stable hemoglobin.    Hypernatremia - improved after we changed IVF to 1/2 NS on 9/16.  Discharge Diagnoses:  Active Problems:   Alzheimer's dementia without behavioral disturbance   Glaucoma   Dehydration   Diabetes mellitus type 2, uncontrolled ([place])   Acute kidney injury superimposed on chronic kidney disease (HCC)   Hypovolemic shock (HCC)   Protein-calorie malnutrition, severe  Discharge Instructions    Medication List    STOP taking these medications   cefUROXime 500 MG tablet Commonly known as:  CEFTIN   lisinopril 2.5 MG tablet Commonly known as:  PRINIVIL,ZESTRIL   metFORMIN 1000  MG tablet Commonly known as:  GLUCOPHAGE   saccharomyces boulardii 250 MG capsule Commonly known as:  FLORASTOR     TAKE these medications   acetaminophen 500 MG tablet Commonly known as:  TYLENOL Take 500 mg by mouth every 4 (four) hours as needed for mild pain or fever.   atorvastatin 20 MG tablet Commonly known as:  LIPITOR Take 20 mg by mouth daily.   citalopram 10 MG tablet Commonly known as:  CELEXA Take 5 mg by mouth daily.   insulin aspart 100 UNIT/ML injection Commonly known as:  novoLOG Inject 3 Units into the skin 3 (three) times daily with meals. Give if patient eats 50% or more of meal   insulin glargine 100 UNIT/ML injection Commonly known as:  LANTUS Inject 0.1 mLs (10 Units total) into the skin daily. Start taking on:  03/05/2016   latanoprost 0.005 % ophthalmic solution Commonly known as:  XALATAN Place 1 drop into both eyes at bedtime. Instill one drop in each eye every night at bedtime for glaucoma. Wait 3-5 minutes between 2 eye med   NAMENDA XR 28 MG Cp24 24 hr capsule Generic drug:  memantine Take 28 mg by mouth daily.   polyethylene glycol packet Commonly known as:  MIRALAX / GLYCOLAX Take 17 g by mouth daily.   sennosides-docusate sodium 8.6-50 MG tablet Commonly known as:  SENOKOT-S Take 1 tablet by mouth 2 (two) times daily as needed for constipation. What changed:  when to take this  reasons to take this   tamsulosin 0.4 MG Caps capsule Commonly known as:  FLOMAX Take 0.4 mg by mouth daily.       No Known Allergies  Procedures/Studies: Ct Head Wo Contrast  Result Date: 02/29/2016 CLINICAL DATA:  Altered mental status.  Hyperglycemia. EXAM: CT HEAD WITHOUT CONTRAST TECHNIQUE: Contiguous axial images were obtained from the base of the skull through the vertex without intravenous contrast. COMPARISON:  Head CT 02/16/2016 FINDINGS: Brain: No evidence of acute infarction, hemorrhage, hydrocephalus, extra-axial collection or mass  lesion/mass effect. Stable atrophy and chronic small vessel ischemia. Vascular: Atherosclerosis of skullbase vasculature. No hyperdense vessel. Skull: Normal. Negative for fracture or focal lesion. Sinuses/Orbits: No acute finding. IMPRESSION: No acute intracranial abnormality. Electronically Signed   By: Jeb Levering M.D.   On: 02/29/2016 03:59   Ct Head Wo Contrast  Result Date: 02/17/2016 CLINICAL DATA:  Altered level of consciousness. History of diabetes, dementia, and cancer. EXAM: CT HEAD WITHOUT CONTRAST TECHNIQUE: Contiguous axial images were obtained from the base of the skull through the vertex without intravenous contrast. COMPARISON:  05/20/2010 FINDINGS: Brain: Diffuse cerebral atrophy. Ventricular dilatation consistent with central atrophy. Low-attenuation changes in the deep white matter consistent with small vessel ischemia. No mass effect or midline shift. No abnormal extra-axial fluid collections. Gray-white matter junctions are distinct. Basal cisterns are not effaced. No evidence of acute intracranial hemorrhage. Vascular: Vascular calcifications. Skull: No depressed skull fractures. Sinuses/Orbits: No acute finding. Other: No significant change  since previous study. IMPRESSION: No acute intracranial abnormalities. Chronic atrophy and small vessel ischemic changes. Electronically Signed   By: Lucienne Capers M.D.   On: 02/17/2016 00:09   Dg Chest Port 1 View  Result Date: 02/29/2016 CLINICAL DATA:  Hyperglycemia. EXAM: PORTABLE CHEST 1 VIEW COMPARISON:  06/01/2013 FINDINGS: There are low lung volumes limiting assessment. Bibasilar opacities, favoring atelectasis. Normal heart size and mediastinal contours for technique. No large pleural effusion or pneumothorax. Osseous structures are intact. IMPRESSION: Low lung volumes with bibasilar opacities, likely atelectasis. Electronically Signed   By: Jeb Levering M.D.   On: 02/29/2016 03:49   Dg Abd Portable 1v  Result Date:  02/17/2016 CLINICAL DATA:  Constipation. EXAM: PORTABLE ABDOMEN - 1 VIEW COMPARISON:  None. FINDINGS: There is moderate stool in the transverse colon, descending colon, sigmoid colon and rectum. No all air distended small bowel loops to suggest obstruction. The soft tissue shadows are maintained. An IVC filter is noted. The bony structures are intact. Degenerative changes noted in the lower lumbar spine. IMPRESSION: Moderate stool throughout the colon suggesting constipation. No findings for small bowel obstruction or free air. Electronically Signed   By: Marijo Sanes M.D.   On: 02/17/2016 07:06     Subjective: Pt watching television, eating well, drinking well, no distress  Discharge Exam: Vitals:   03/03/16 2109 03/04/16 0522  BP: 107/78 95/67  Pulse: 76 98  Resp: 16 16  Temp: 97.8 F (36.6 C) 98 F (36.7 C)   Vitals:   03/03/16 0620 03/03/16 1437 03/03/16 2109 03/04/16 0522  BP: 103/66 109/73 107/78 95/67  Pulse: 68 82 76 98  Resp: 19 16 16 16   Temp: 97.4 F (36.3 C)  97.8 F (36.6 C) 98 F (36.7 C)  TempSrc: Axillary  Axillary Axillary  SpO2: 98% 98% 98% 100%  Weight:      Height:       General: Frail elderly male in NAD lying in bed, awake, cooperative, alert. Neuro: Opens eyes to voice, answers to his name, yes/no to questions HEENT: [place]/AT, PERRL, no JVD Cardiovascular: RRR, no MRG Lungs: Clear bilateral breath sounds Abdomen: Soft, non-distended normal BS Musculoskeletal: No acute deformity, mild edema in the extremities. Skin: Grossly intact  The results of significant diagnostics from this hospitalization (including imaging, microbiology, ancillary and laboratory) are listed below for reference.     Microbiology: Recent Results (from the past 240 hour(s))  Blood culture (routine x 2)     Status: None (Preliminary result)   Collection Time: 02/29/16  3:01 AM  Result Value Ref Range Status   Specimen Description BLOOD LEFT WRIST  Final   Special  Requests BOTTLES DRAWN AEROBIC AND ANAEROBIC 3ML AND 5ML  Final   Culture   Final    NO GROWTH 3 DAYS Performed at [facility]    Report Status PENDING  Incomplete  Urine culture     Status: None   Collection Time: 02/29/16  3:05 AM  Result Value Ref Range Status   Specimen Description URINE, CLEAN CATCH  Final   Special Requests NONE  Final   Culture NO GROWTH Performed at [facility]   Final   Report Status 03/01/2016 FINAL  Final  Blood culture (routine x 2)     Status: None (Preliminary result)   Collection Time: 02/29/16  3:18 AM  Result Value Ref Range Status   Specimen Description BLOOD RIGHT WRIST  Final   Special Requests IN PEDIATRIC BOTTLE 4ML  Final  Culture   Final    NO GROWTH 3 DAYS Performed at [facility]    Report Status PENDING  Incomplete  MRSA PCR Screening     Status: None   Collection Time: 02/29/16  7:03 AM  Result Value Ref Range Status   MRSA by PCR NEGATIVE NEGATIVE Final    Comment:        The GeneXpert MRSA Assay (FDA approved for NASAL specimens only), is one component of a comprehensive MRSA colonization surveillance program. It is not intended to diagnose MRSA infection nor to guide or monitor treatment for MRSA infections.     Labs: BNP (last 3 results) No results for input(s): BNP in the last 8760 hours. Basic Metabolic Panel:  Recent Labs Lab 02/29/16 0225 02/29/16 2016 03/01/16 0410 03/02/16 0539 03/03/16 0526  NA 144  --  147* 146* 143  K 4.1  --  3.6 3.6 4.3  CL 113*  --  118* 114* 111  CO2 19*  --  23 27 25   GLUCOSE 556*  --  195* 190* 278*  BUN 49*  --  21* 13 10  CREATININE 2.99*  --  1.37* 1.26* 1.25*  CALCIUM 9.8  --  8.6* 8.6* 8.8*  MG  --  2.1  --   --   --    Liver Function Tests:  Recent Labs Lab 02/29/16 0225  AST 25  ALT 52  ALKPHOS 97  BILITOT 0.6  PROT 8.4*  ALBUMIN 4.2   No results for input(s): LIPASE, AMYLASE in the last 168 hours.  Recent Labs Lab  02/29/16 0538  AMMONIA 13   CBC:  Recent Labs Lab 02/29/16 0225 03/01/16 0410 03/02/16 0539 03/03/16 0526  WBC 13.6* 11.2* 6.6 6.3  NEUTROABS 11.6*  --   --   --   HGB 15.0 13.5 12.9* 12.3*  HCT 44.5 40.5 38.6* 37.1*  MCV 91.4 92.3 90.6 92.1  PLT 305 261 221 198   Cardiac Enzymes:  Recent Labs Lab 02/29/16 0225  TROPONINI 0.17*   BNP: Invalid input(s): POCBNP CBG:  Recent Labs Lab 03/03/16 0804 03/03/16 1219 03/03/16 1718 03/03/16 2216 03/04/16 0741  GLUCAP 243* 308* 277* 186* 207*   D-Dimer No results for input(s): DDIMER in the last 72 hours. Hgb A1c No results for input(s): HGBA1C in the last 72 hours. Lipid Profile No results for input(s): CHOL, HDL, LDLCALC, TRIG, CHOLHDL, LDLDIRECT in the last 72 hours. Thyroid function studies No results for input(s): TSH, T4TOTAL, T3FREE, THYROIDAB in the last 72 hours.  Invalid input(s): FREET3 Anemia work up No results for input(s): VITAMINB12, FOLATE, FERRITIN, TIBC, IRON, RETICCTPCT in the last 72 hours. Urinalysis    Component Value Date/Time   COLORURINE YELLOW 02/29/2016 0305   APPEARANCEUR CLOUDY (A) 02/29/2016 0305   APPEARANCEUR Clear 05/14/2014 2030   LABSPEC 1.030 02/29/2016 0305   LABSPEC 1.021 05/14/2014 2030   PHURINE 5.0 02/29/2016 0305   GLUCOSEU >1000 (A) 02/29/2016 0305   GLUCOSEU Negative 05/14/2014 2030   HGBUR TRACE (A) 02/29/2016 0305   BILIRUBINUR SMALL (A) 02/29/2016 0305   BILIRUBINUR Negative 05/14/2014 2030   KETONESUR NEGATIVE 02/29/2016 0305   PROTEINUR 30 (A) 02/29/2016 0305   NITRITE NEGATIVE 02/29/2016 0305   LEUKOCYTESUR TRACE (A) 02/29/2016 0305   LEUKOCYTESUR Negative 05/14/2014 2030   Sepsis Labs Invalid input(s): PROCALCITONIN,  WBC,  LACTICIDVEN Microbiology Recent Results (from the past 240 hour(s))  Blood culture (routine x 2)     Status: None (Preliminary result)  Collection Time: 02/29/16  3:01 AM  Result Value Ref Range Status   Specimen Description  BLOOD LEFT WRIST  Final   Special Requests BOTTLES DRAWN AEROBIC AND ANAEROBIC 3ML AND 5ML  Final   Culture   Final    NO GROWTH 3 DAYS Performed at [facility]    Report Status PENDING  Incomplete  Urine culture     Status: None   Collection Time: 02/29/16  3:05 AM  Result Value Ref Range Status   Specimen Description URINE, CLEAN CATCH  Final   Special Requests NONE  Final   Culture NO GROWTH Performed at [facility]   Final   Report Status 03/01/2016 FINAL  Final  Blood culture (routine x 2)     Status: None (Preliminary result)   Collection Time: 02/29/16  3:18 AM  Result Value Ref Range Status   Specimen Description BLOOD RIGHT WRIST  Final   Special Requests IN PEDIATRIC BOTTLE 4ML  Final   Culture   Final    NO GROWTH 3 DAYS Performed at [facility]    Report Status PENDING  Incomplete  MRSA PCR Screening     Status: None   Collection Time: 02/29/16  7:03 AM  Result Value Ref Range Status   MRSA by PCR NEGATIVE NEGATIVE Final    Comment:        The GeneXpert MRSA Assay (FDA approved for NASAL specimens only), is one component of a comprehensive MRSA colonization surveillance program. It is not intended to diagnose MRSA infection nor to guide or monitor treatment for MRSA infections.    Time coordinating discharge: 33 minutes  SIGNED:  Irwin Brakeman, MD  [facility] 03/04/2016, 10:20 AM Pager   If 7PM-7AM, please contact night-coverage [email or web address] Password [email or web address]

## 2016-03-04 NOTE — Care Management Important Message (Signed)
Important Message  Patient Details  Name: Derrick Rubio MRN: [id number] Date of Birth: 28-Nov-1935   Medicare Important Message Given:  Yes    Camillo Flaming 03/04/2016, 10:47 AMImportant Message  Patient Details  Name: Derrick Rubio MRN: [id number] Date of Birth: [birth year]   Medicare Important Message Given:  Yes    Camillo Flaming 03/04/2016, 10:46 AM

## 2016-03-04 NOTE — Progress Notes (Signed)
Nutrition Follow-up  DOCUMENTATION CODES:   Severe malnutrition in context of acute illness/injury  INTERVENTION:  Mighty Shake II TID with meals, each supplement provides 480-500 kcals and 20-23 grams of protein.  Magic Cup TID with meals, each supplement provides 29 kcals and 9 grams of protein.  NUTRITION DIAGNOSIS:   Inadequate oral intake related to inability to eat as evidenced by NPO status.  Improved - patient no longer NPO. Per wife, eating 100% of meals.  GOAL:   Patient will meet greater than or equal to 90% of their needs  Meeting.  MONITOR:   Diet advancement, Weight trends, Labs, I & O's  REASON FOR ASSESSMENT:   Malnutrition Screening Tool, Consult Assessment of nutrition requirement/status  ASSESSMENT:   80 year old male with PMH as below, which is significant for Alzheimer's dementia, CKD, DM, latent TB and prostate cancer. He has been in SNF for a few years now due to Alzheimer's and is wheelchair bound as he no longer walks. His wife states that he can recognize family members, but is very limited with his ADLs. He was recently admitted for UTI and was treated with ABX. Wife reports he has never really gotten back to baseline since that admission. He was doing OK for a couple of days, but then grew progressively drowsy to the point he was not waking up for meals. 9/14 he was essentially unresponsive with glucose up to 700s so he was transferred to ER.   Patient's wife present at bedside. She reports he has been eating 100% of trays ordered for patient. She also reports that at SNF patient was receiving Mighty Shake and Magic Cup at least 2x/day (lunch and dinner trays), and possibly on breakfast tray. She reports his appetite seems good.   Meal Completion: 100% per wife. If patient ate 100% of meals in past 24 hrs, he had 1449 kcals, 75 grams protein.  Medications reviewed and include: famotidine, Novolog 3 units TID with meals, Lantus 8 units daily, zofran  prn.  Labs reviewed: CBG 186-308 past 24 hrs.   Discussed plan with RN. Plan for patient to discharge today to SNF. Although diet order is Carb Modified instead of Dys 1, patient's wife has been ordering pureed foods for pt.   Diet Order:  Diet Carb Modified Fluid consistency: Thin; Room service appropriate? Yes  Skin:  Reviewed, no issues  Last BM:  03/02/2016  Height:   Ht Readings from Last 1 Encounters:  02/29/16 6' (1.829 m)    Weight:   Wt Readings from Last 1 Encounters:  03/01/16 188 lb 15 oz (85.7 kg)    Ideal Body Weight:  80.91 kg  BMI:  Body mass index is 25.62 kg/m.  Estimated Nutritional Needs:   Kcal:  1600-1800  Protein:  65-75 grams  Fluid:  1.6-1.8 L/day  EDUCATION NEEDS:   No education needs identified at this time  Willey Blade, MS, RD, LDN Pager: [phone] After Hours Pager: [phone]

## 2016-03-04 NOTE — NC FL2 (Signed)
[place] LEVEL OF CARE SCREENING TOOL     IDENTIFICATION  Patient Name: Derrick Rubio Birthdate: 09-11-1935 Sex: male Admission Date (Current Location): 02/29/2016  [facility] and [place] Number:  Herbalist and Address:  [facility] [place],  [place] [address], [place]      Provider Number: [id number]  Attending Physician Name and Address:  Murlean Iba, MD  Relative Name and Phone Number:       Current Level of Care: Hospital Recommended Level of Care: [place] Prior Approval Number:    Date Approved/Denied:   PASRR Number: [id number]  Discharge Plan: SNF    Current Diagnoses: Patient Active Problem List   Diagnosis Date Noted  . Protein-calorie malnutrition, severe 03/01/2016  . Hypovolemic shock ([place]) 02/29/2016  . Dehydration 02/17/2016  . Diabetes mellitus type 2, uncontrolled ([place]) 02/17/2016  . Acute kidney injury superimposed on chronic kidney disease ([place]) 02/17/2016  . Goals of care, counseling/discussion 02/17/2016  . Sepsis ([place]) 02/17/2016  . Acute encephalopathy 02/17/2016  . Palliative care encounter   . Major depression, chronic ([place]) 12/08/2015  . Chronic constipation 11/09/2015  . Type 2 diabetes mellitus with diabetic nephropathy, without long-term current use of insulin ([place]) 10/11/2015  . Microalbuminuria due to type 2 diabetes mellitus ([place]) 09/07/2015  . Alzheimer's disease 07/14/2015  . Type 2 diabetes mellitus with diabetic peripheral angiopathy without gangrene, without long-term current use of insulin ([place]) 07/14/2015  . Depression with anxiety 02/08/2015  . BPH without obstruction/lower urinary tract symptoms 10/24/2014  . Hyperlipidemia LDL goal <100 10/24/2014  . Type 2 diabetes with peripheral circulatory disorder, controlled ([place]) 10/24/2014  . Rectal bleeding 12/08/2013  . Glaucoma 11/02/2013  . 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 Alzheimer's dementia without  behavioral disturbance 09/16/2013  . Osteoarthritis 09/16/2013    Orientation RESPIRATION BLADDER Height & Weight        Normal Incontinent Weight: 188 lb 15 oz (85.7 kg) Height:  6' (182.9 cm)  BEHAVIORAL SYMPTOMS/MOOD NEUROLOGICAL BOWEL NUTRITION STATUS  Other (Comment) (no behaviors)   Incontinent Diet  AMBULATORY STATUS COMMUNICATION OF NEEDS Skin   Total Care Verbally Normal                       Personal Care Assistance Level of Assistance  Total care Bathing Assistance: Maximum assistance Feeding assistance: Maximum assistance Dressing Assistance: Maximum assistance Total Care Assistance: Maximum assistance   Functional Limitations Info  Sight, Hearing, Speech Sight Info: Adequate Hearing Info: Adequate Speech Info: Adequate    SPECIAL CARE FACTORS FREQUENCY                       Contractures Contractures Info: Not present    Additional Factors Info  Code Status, Insulin Sliding Scale Code Status Info: DNR             Current Medications (03/04/2016):  This is the current hospital active medication list Current Facility-Administered Medications  Medication Dose Route Frequency Provider Last Rate Last Dose  . 0.9 %  sodium chloride infusion  250 mL Intravenous PRN Corey Harold, NP 20 mL/hr at 03/03/16 0255 250 mL at 03/03/16 0255  . chlorhexidine (PERIDEX) 0.12 % solution 15 mL  15 mL Mouth Rinse BID Praveen Mannam, MD   15 mL at 03/03/16 2249  . famotidine (PEPCID) IVPB 20 mg premix  20 mg Intravenous Q24H Corey Harold, NP   20 mg at 03/03/16  [id number]  . heparin injection 5,000 Units  5,000 Units Subcutaneous Q8H Corey Harold, NP   5,000 Units at 03/04/16 [phone]  . insulin aspart (novoLOG) injection 0-20 Units  0-20 Units Subcutaneous TID WC [place] Marisa Hua, MD   11 Units at 03/03/16 1810  . insulin aspart (novoLOG) injection 3 Units  3 Units Subcutaneous TID WC [place] Marisa Hua, MD   3 Units at 03/03/16 1809  . insulin glargine (LANTUS)  injection 8 Units  8 Units Subcutaneous Daily [place] Marisa Hua, MD   8 Units at 03/03/16 1054  . latanoprost (XALATAN) 0.005 % ophthalmic solution 1 drop  1 drop Both Eyes QHS Donita Brooks, NP   1 drop at 03/03/16 2250  . MEDLINE mouth rinse  15 mL Mouth Rinse q12n4p Praveen Mannam, MD   15 mL at 03/03/16 1600  . memantine (NAMENDA XR) 24 hr capsule 28 mg  28 mg Oral Daily Donita Brooks, NP   28 mg at 03/03/16 0955  . ondansetron (ZOFRAN) injection 4 mg  4 mg Intravenous Q6H PRN Donita Brooks, NP   4 mg at 03/01/16 1043  . tamsulosin (FLOMAX) capsule 0.4 mg  0.4 mg Oral Daily Donita Brooks, NP   0.4 mg at 03/03/16 [id number]     Discharge Medications: Please see discharge summary for a list of discharge medications.  Relevant Imaging Results:  Relevant Lab Results:   Additional Information SSN: [id number]  Jillyan Plitt, Randall An, LCSW

## 2016-03-05 ENCOUNTER — Telehealth: Payer: Self-pay | Admitting: Internal Medicine

## 2016-03-05 LAB — CULTURE, BLOOD (ROUTINE X 2)
CULTURE: NO GROWTH
CULTURE: NO GROWTH

## 2016-03-05 NOTE — Telephone Encounter (Signed)
Possible re-admission to facility  - This is a patient you were seeing at **[facility]** - [facility] fu is needed IF patient was re-admitted to facility upon discharge. Hospital discharge **03/04/16**

## 2016-03-06 ENCOUNTER — Encounter: Payer: Self-pay | Admitting: Internal Medicine

## 2016-03-06 ENCOUNTER — Non-Acute Institutional Stay (SKILLED_NURSING_FACILITY): Payer: Medicare Other | Admitting: Internal Medicine

## 2016-03-06 DIAGNOSIS — N183 Chronic kidney disease, stage 3 unspecified: Secondary | ICD-10-CM

## 2016-03-06 DIAGNOSIS — K5909 Other constipation: Secondary | ICD-10-CM | POA: Diagnosis not present

## 2016-03-06 DIAGNOSIS — F0280 Dementia in other diseases classified elsewhere without behavioral disturbance: Secondary | ICD-10-CM | POA: Diagnosis not present

## 2016-03-06 DIAGNOSIS — E785 Hyperlipidemia, unspecified: Secondary | ICD-10-CM | POA: Diagnosis not present

## 2016-03-06 DIAGNOSIS — N400 Enlarged prostate without lower urinary tract symptoms: Secondary | ICD-10-CM | POA: Diagnosis not present

## 2016-03-06 DIAGNOSIS — E440 Moderate protein-calorie malnutrition: Secondary | ICD-10-CM | POA: Diagnosis not present

## 2016-03-06 DIAGNOSIS — G308 Other Alzheimer's disease: Secondary | ICD-10-CM

## 2016-03-06 DIAGNOSIS — R531 Weakness: Secondary | ICD-10-CM | POA: Diagnosis not present

## 2016-03-06 DIAGNOSIS — E1121 Type 2 diabetes mellitus with diabetic nephropathy: Secondary | ICD-10-CM | POA: Diagnosis not present

## 2016-03-06 DIAGNOSIS — F329 Major depressive disorder, single episode, unspecified: Secondary | ICD-10-CM | POA: Diagnosis not present

## 2016-03-06 NOTE — Progress Notes (Signed)
LOCATION: [facility]   PCP: Blanchie Serve, MD   Code Status: DNR  Goals of care: Advanced Directive information Advanced Directives 02/29/2016  Does patient have an advance directive? Yes  Type of Advance Directive Living will;Out of facility DNR (pink MOST or yellow form)  Does patient want to make changes to advanced directive? -  Copy of advanced directive(s) in chart? -  Would patient like information on creating an advanced directive? -  Pre-existing out of facility DNR order (yellow form or pink MOST form) Yellow form placed in chart (order not valid for inpatient use)       Extended Emergency Contact Information Primary Emergency Contact: Lafon,Sandra Address: [address], [place] Phone: [phone] Work Phone: [phone] Mobile Phone: [phone] Relation: Spouse Secondary Emergency Contact: Vincelette,William Address: [address]          [place], [place] [address] Johnnette Litter of [place] Phone: [phone] Mobile Phone: [phone] Relation: Son   No Known Allergies  Chief Complaint  Patient presents with  . Readmit To SNF    Readmission Visit     HPI:  Patient is a 80 y.o. male seen today for long term care post hospital readmission from 02/29/16-03/04/16 with hyperglycemia and hypovolemia with hypotension. He required iv fluids, antibiotic and pressor support. No infectious etiology was identified. Antibiotic was discontinued. His metformin was discontinued and he was placed on insulin. He is seen in his room today with his wife at bedside. He has PMH of Alzheimer's dementia, hyperlipidemia, Hypertension, dm type 2 among others.   Review of Systems: unable to obtain due to advance dementia.     Past Medical History:  Diagnosis Date  . CKD (chronic kidney disease)   . Dementia   . 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 Diabetes mellitus without complication ([place])   . DVT (deep  venous thrombosis) ([place]) 09/16/2013  . Glaucoma   . Latent tuberculosis 09/16/2013  . Osteoarthritis 09/16/2013  . Prostate cancer ([place]) 2006   s/p seed radiation  . Pure hypercholesterolemia 10/18/2013   Past Surgical History:  Procedure Laterality Date  . COLONOSCOPY WITH PROPOFOL N/A 01/24/2014   Procedure: COLONOSCOPY WITH PROPOFOL;  Surgeon: Garlan Fair, MD;  Location: [facility];  Service: Endoscopy;  Laterality: N/A;  . ivc filter placement     Social History:   reports that he has never smoked. He has never used smokeless tobacco. He reports that he does not drink alcohol or use drugs.  No family history on file.  Medications:   Medication List       Accurate as of 03/06/16  2:57 PM. Always use your most recent med list.          acetaminophen 500 MG tablet Commonly known as:  TYLENOL Take 500 mg by mouth every 4 (four) hours as needed for mild pain or fever.   atorvastatin 20 MG tablet Commonly known as:  LIPITOR Take 20 mg by mouth daily.   citalopram 10 MG tablet Commonly known as:  CELEXA Take 5 mg by mouth daily.   insulin glargine 100 UNIT/ML injection Commonly known as:  LANTUS Inject 0.1 mLs (10 Units total) into the skin daily.   insulin lispro 100 UNIT/ML injection Commonly known as:  HUMALOG Inject 5-10 Units into the skin 4 (four) times daily -  before meals and at bedtime. 0-60= hypoglycemic protocol, 61-149= no insulin, 150-250= 5  units, 251-300= 8 units, 301-350= 10 units, >350= NOTIFY MD   latanoprost 0.005 % ophthalmic solution Commonly known as:  XALATAN Place 1 drop into both eyes at bedtime. Instill one drop in each eye every night at bedtime for glaucoma. Wait 3-5 minutes between 2 eye med   lisinopril 2.5 MG tablet Commonly known as:  PRINIVIL,ZESTRIL Take 2.5 mg by mouth daily.   NAMENDA XR 28 MG Cp24 24 hr capsule Generic drug:  memantine Take 28 mg by mouth daily.   polyethylene glycol packet Commonly known as:  MIRALAX /  GLYCOLAX Take 17 g by mouth daily.   sennosides-docusate sodium 8.6-50 MG tablet Commonly known as:  SENOKOT-S Take 1 tablet by mouth 2 (two) times daily as needed for constipation.   tamsulosin 0.4 MG Caps capsule Commonly known as:  FLOMAX Take 0.4 mg by mouth daily.   UNABLE TO FIND Med Name: Sugar Free Med pass 90 cc by mouth 2 times daily       Immunizations: Immunization History  Administered Date(s) Administered  . Influenza-Unspecified 03/29/2014, 04/14/2015  . PPD Test 12/15/2013, 02/21/2015  . Pneumococcal-Unspecified 04/06/2014     Physical Exam:  Vitals:   03/06/16 1440  BP: 124/76  Pulse: 78  Resp: 20  Temp: 98.9 F (37.2 C)  TempSrc: Oral  SpO2: 95%  Weight: 188 lb 14.4 oz (85.7 kg)  Height: 6' (1.829 m)   Body mass index is 25.62 kg/m.  General- elderly male in no acute distress Head- atraumatic, normocephalic Eyes- no pallor, no icterus Mouth- edentulous, moist mucus membrane Neck- no lymphadenopathy Cardiovascular- normal s1,s2, no murmur Respiratory- bilateral clear to auscultation, no wheeze, no rhonchi, no crackles Abdomen- bowel sounds present, soft, non tender Musculoskeletal- able to move all 4 extremities, no leg edema, lower extremity weakness present, under total care Neurological- alert and oriented to self only, has dementia    Labs reviewed: Basic Metabolic Panel:  Recent Labs  02/29/16 2016 03/01/16 0410 03/02/16 0539 03/03/16 0526  NA  --  147* 146* 143  K  --  3.6 3.6 4.3  CL  --  118* 114* 111  CO2  --  23 27 25   GLUCOSE  --  195* 190* 278*  BUN  --  21* 13 10  CREATININE  --  1.37* 1.26* 1.25*  CALCIUM  --  8.6* 8.6* 8.8*  MG 2.1  --   --   --    Liver Function Tests:  Recent Labs  09/25/15 02/16/16 2304 02/29/16 0225  AST 20 24 25   ALT 22 23 52  ALKPHOS 90 79 97  BILITOT  --  0.9 0.6  PROT  --  7.5 8.4*  ALBUMIN  --  3.8 4.2   No results for input(s): LIPASE, AMYLASE in the last 8760  hours.  Recent Labs  02/29/16 0538  AMMONIA 13   CBC:  Recent Labs  02/29/16 0225 03/01/16 0410 03/02/16 0539 03/03/16 0526  WBC 13.6* 11.2* 6.6 6.3  NEUTROABS 11.6*  --   --   --   HGB 15.0 13.5 12.9* 12.3*  HCT 44.5 40.5 38.6* 37.1*  MCV 91.4 92.3 90.6 92.1  PLT 305 261 221 198   Cardiac Enzymes:  Recent Labs  02/29/16 0225  TROPONINI 0.17*   BNP: Invalid input(s): POCBNP CBG:  Recent Labs  03/03/16 2216 03/04/16 0741 03/04/16 1152  GLUCAP 186* 207* 302*    Radiological Exams: Ct Head Wo Contrast  Result Date: 02/29/2016 CLINICAL DATA:  Altered mental  status.  Hyperglycemia. EXAM: CT HEAD WITHOUT CONTRAST TECHNIQUE: Contiguous axial images were obtained from the base of the skull through the vertex without intravenous contrast. COMPARISON:  Head CT 02/16/2016 FINDINGS: Brain: No evidence of acute infarction, hemorrhage, hydrocephalus, extra-axial collection or mass lesion/mass effect. Stable atrophy and chronic small vessel ischemia. Vascular: Atherosclerosis of skullbase vasculature. No hyperdense vessel. Skull: Normal. Negative for fracture or focal lesion. Sinuses/Orbits: No acute finding. IMPRESSION: No acute intracranial abnormality. Electronically Signed   By: Jeb Levering M.D.   On: 02/29/2016 03:59   Ct Head Wo Contrast  Result Date: 02/17/2016 CLINICAL DATA:  Altered level of consciousness. History of diabetes, dementia, and cancer. EXAM: CT HEAD WITHOUT CONTRAST TECHNIQUE: Contiguous axial images were obtained from the base of the skull through the vertex without intravenous contrast. COMPARISON:  05/20/2010 FINDINGS: Brain: Diffuse cerebral atrophy. Ventricular dilatation consistent with central atrophy. Low-attenuation changes in the deep white matter consistent with small vessel ischemia. No mass effect or midline shift. No abnormal extra-axial fluid collections. Gray-white matter junctions are distinct. Basal cisterns are not effaced. No evidence of  acute intracranial hemorrhage. Vascular: Vascular calcifications. Skull: No depressed skull fractures. Sinuses/Orbits: No acute finding. Other: No significant change since previous study. IMPRESSION: No acute intracranial abnormalities. Chronic atrophy and small vessel ischemic changes. Electronically Signed   By: Lucienne Capers M.D.   On: 02/17/2016 00:09   Dg Chest Port 1 View  Result Date: 02/29/2016 CLINICAL DATA:  Hyperglycemia. EXAM: PORTABLE CHEST 1 VIEW COMPARISON:  06/01/2013 FINDINGS: There are low lung volumes limiting assessment. Bibasilar opacities, favoring atelectasis. Normal heart size and mediastinal contours for technique. No large pleural effusion or pneumothorax. Osseous structures are intact. IMPRESSION: Low lung volumes with bibasilar opacities, likely atelectasis. Electronically Signed   By: Jeb Levering M.D.   On: 02/29/2016 03:49   Dg Abd Portable 1v  Result Date: 02/17/2016 CLINICAL DATA:  Constipation. EXAM: PORTABLE ABDOMEN - 1 VIEW COMPARISON:  None. FINDINGS: There is moderate stool in the transverse colon, descending colon, sigmoid colon and rectum. No all air distended small bowel loops to suggest obstruction. The soft tissue shadows are maintained. An IVC filter is noted. The bony structures are intact. Degenerative changes noted in the lower lumbar spine. IMPRESSION: Moderate stool throughout the colon suggesting constipation. No findings for small bowel obstruction or free air. Electronically Signed   By: Marijo Sanes M.D.   On: 02/17/2016 07:06    Assessment/Plan  Generalized weakness With deconditioning and advanced dementia. Provide supportive care. Get palliative care consult  Dm type 2 with microalbuminuria Monitor cbg, continue lantus 10 u daily with sliding scale humalog. Continue lisinopril 2.5 mg daily. a1c 7.8. Check a1c  Hyperlipidemia Continue atorvastatin 20 mg daily  Chronic depression Continue celexa 5 mg daily  Protein calorie  malnutrition Monitor po intake. Maintain hydration. RD consult. medpass supplement  Alzheimer's dementia Continue statin and namenda xr, supportive care, decline anticipated  Chronic constipation Continue senokot s and miralax  BPH Continue tamsulosin  ckd stage 3 Monitor bmp    Family/ staff Communication: reviewed care plan with patient, his wife and nursing supervisor    Blanchie Serve, MD Internal Medicine [place] [address] [place], [place] [address] Cell Phone (Monday-Friday 8 am - 5 pm): [phone] On Call: [phone] and follow prompts after 5 pm and on weekends Office Phone: [phone] Office Fax: [phone]

## 2016-03-19 LAB — BASIC METABOLIC PANEL
BUN: 6 mg/dL (ref 4–21)
CREATININE: 1 mg/dL (ref 0.6–1.3)
Glucose: 158 mg/dL
Potassium: 3.9 mmol/L (ref 3.4–5.3)
Sodium: 140 mmol/L (ref 137–147)

## 2016-03-19 LAB — CBC AND DIFFERENTIAL
HEMATOCRIT: 36 % — AB (ref 41–53)
Hemoglobin: 12 g/dL — AB (ref 13.5–17.5)
PLATELETS: 204 10*3/uL (ref 150–399)
WBC: 5.6 10*3/mL

## 2016-03-19 LAB — HEMOGLOBIN A1C: HEMOGLOBIN A1C: 11.4

## 2016-04-15 ENCOUNTER — Non-Acute Institutional Stay (SKILLED_NURSING_FACILITY): Payer: Medicare Other | Admitting: Internal Medicine

## 2016-04-15 ENCOUNTER — Encounter: Payer: Self-pay | Admitting: Internal Medicine

## 2016-04-15 DIAGNOSIS — K5909 Other constipation: Secondary | ICD-10-CM | POA: Diagnosis not present

## 2016-04-15 DIAGNOSIS — Z86718 Personal history of other venous thrombosis and embolism: Secondary | ICD-10-CM | POA: Diagnosis not present

## 2016-04-15 DIAGNOSIS — E785 Hyperlipidemia, unspecified: Secondary | ICD-10-CM

## 2016-04-15 NOTE — Progress Notes (Signed)
LOC[facility]   PCP: Blanchie Serve, MD   Code Status: DNR  Goals of care: Advanced Directive information Advanced Directives 02/29/2016  Does patient have an advance directive? Yes  Type of Advance Directive Living will;Out of facility DNR (pink MOST or yellow form)  Does patient want to make changes to advanced directive? -  Copy of advanced directive(s) in chart? -  Would patient like information on creating an advanced directive? -  Pre-existing out of facility DNR order (yellow form or pink MOST form) Yellow form placed in chart (order not valid for inpatient use)       Extended Emergency Contact Information Primary Emergency Contact: Janssens,Sandra Address: [address], [place] of [place] Phone: [phone] Work Phone: [phone] Mobile Phone: [phone] Relation: Spouse Secondary Emergency Contact: Doby,William Address: [address]          [place], [place] [address] Johnnette Litter of [place] Phone: [phone] Mobile Phone: [phone] Relation: Son   No Known Allergies  Chief Complaint  Patient presents with  . Medical Management of Chronic Issues    Routine Visit     HPI:  Patient is a 80 y.o. male seen today for routine visit. He has been at his baseline. No new concern from nursing. He is in the process of being transferred to another facility for long term care. He does not participate in HPI and ROS.   Review of Systems: unable to obtain due to advance dementia.     Past Medical History:  Diagnosis Date  . CKD (chronic kidney disease)   . Dementia   . 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 Diabetes mellitus without complication ([place])   . DVT (deep venous thrombosis) ([facility]) 09/16/2013  . Glaucoma   . Latent tuberculosis 09/16/2013  . Osteoarthritis 09/16/2013  . Prostate cancer ([place] [facility]) 2006   s/p seed radiation  . Pure hypercholesterolemia 10/18/2013   Past Surgical History:    Procedure Laterality Date  . COLONOSCOPY WITH PROPOFOL N/A 01/24/2014   Procedure: COLONOSCOPY WITH PROPOFOL;  Surgeon: Garlan Fair, MD;  [facility];  Service: Endoscopy;  Laterality: N/A;  . ivc filter placement     Social History:   reports that he has never smoked. He has never used smokeless tobacco. He reports that he does not drink alcohol or use drugs.  No family history on file.  Medications:   Medication List       Accurate as of 04/15/16  2:51 PM. Always use your most recent med list.          acetaminophen 500 MG tablet Commonly known as:  TYLENOL Take 500 mg by mouth every 4 (four) hours as needed for mild pain or fever.   atorvastatin 20 MG tablet Commonly known as:  LIPITOR Take 20 mg by mouth daily.   bisacodyl 10 MG suppository Commonly known as:  DULCOLAX Place 10 mg rectally every 3 (three) days.   insulin glargine 100 UNIT/ML injection Commonly known as:  LANTUS Inject 14 Units into the skin at bedtime.   insulin lispro 100 UNIT/ML injection Commonly known as:  HUMALOG Inject 5-10 Units into the skin 4 (four) times daily -  before meals and at bedtime. 0-60= hypoglycemic protocol, 61-149= no insulin, 150-250= 5 units, 251-300= 8 units, 301-350= 10 units, >350= NOTIFY MD   latanoprost 0.005 % ophthalmic solution Commonly known as:  XALATAN Place 1 drop into  both eyes at bedtime. Instill one drop in each eye every night at bedtime for glaucoma. Wait 3-5 minutes between 2 eye med   lisinopril 2.5 MG tablet Commonly known as:  PRINIVIL,ZESTRIL Take 2.5 mg by mouth daily.   NAMENDA XR 28 MG Cp24 24 hr capsule Generic drug:  memantine Take 28 mg by mouth daily.   nystatin cream Commonly known as:  MYCOSTATIN Apply 1 application topically 2 (two) times daily.   sennosides-docusate sodium 8.6-50 MG tablet Commonly known as:  SENOKOT-S Take 2 tablets by mouth at bedtime.   tamsulosin 0.4 MG Caps capsule Commonly known as:   FLOMAX Take 0.4 mg by mouth daily.   UNABLE TO FIND Med Name: Sugar Free Med pass 90 cc by mouth 2 times daily       Immunizations: Immunization History  Administered Date(s) Administered  . Influenza-Unspecified 03/29/2014, 04/14/2015  . PPD Test 12/15/2013, 02/21/2015  . Pneumococcal-Unspecified 04/06/2014     Physical Exam:  Vitals:   04/15/16 1443  BP: 132/66  Pulse: 71  Resp: 18  Temp: 97.4 F (36.3 C)  TempSrc: Oral  Weight: 189 lb 3.2 oz (85.8 kg)  Height: 6' (1.829 m)   Body mass index is 25.66 kg/m.  General- elderly male in no acute distress Head- atraumatic, normocephalic Eyes- no pallor, no icterus Mouth- edentulous, moist mucus membrane Neck- no lymphadenopathy Cardiovascular- normal s1,s2, no murmur Respiratory- bilateral clear to auscultation Abdomen- bowel sounds present, soft, non tender Musculoskeletal- able to move all 4 extremities, no leg edema, lower extremity weakness present, under total care Neurological- alert and oriented to self only, has dementia    Labs reviewed: Basic Metabolic Panel:  Recent Labs  02/29/16 2016 03/01/16 0410 03/02/16 0539 03/03/16 0526 03/19/16  NA  --  147* 146* 143 140  K  --  3.6 3.6 4.3 3.9  CL  --  118* 114* 111  --   CO2  --  23 27 25   --   GLUCOSE  --  195* 190* 278*  --   BUN  --  21* 13 10 6   CREATININE  --  1.37* 1.26* 1.25* 1.0  CALCIUM  --  8.6* 8.6* 8.8*  --   MG 2.1  --   --   --   --    Liver Function Tests:  Recent Labs  09/25/15 02/16/16 2304 02/29/16 0225  AST 20 24 25   ALT 22 23 52  ALKPHOS 90 79 97  BILITOT  --  0.9 0.6  PROT  --  7.5 8.4*  ALBUMIN  --  3.8 4.2   No results for input(s): LIPASE, AMYLASE in the last 8760 hours.  Recent Labs  02/29/16 0538  AMMONIA 13   CBC:  Recent Labs  02/29/16 0225 03/01/16 0410 03/02/16 0539 03/03/16 0526 03/19/16  WBC 13.6* 11.2* 6.6 6.3 5.6  NEUTROABS 11.6*  --   --   --   --   HGB 15.0 13.5 12.9* 12.3* 12.0*  HCT  44.5 40.5 38.6* 37.1* 36*  MCV 91.4 92.3 90.6 92.1  --   PLT 305 261 221 198 204   Cardiac Enzymes:  Recent Labs  02/29/16 0225  TROPONINI 0.17*   BNP: Invalid input(s): POCBNP CBG:  Recent Labs  03/03/16 2216 03/04/16 0741 03/04/16 1152  GLUCAP 186* 207* 302*    Radiological Exams: No results found.  Assessment/Plan  History of DVT With IVC filter placed in 2015. Monitor clinically.   Hyperlipidemia Continue atorvastatin 20 mg  daily  Chronic constipation Continue senokot s and miralax   Family/ staff Communication: reviewed care plan with patient's nursing supervisor    Blanchie Serve, MD Internal Medicine [place], [place] [address] Cell Phone (Monday-Friday 8 am - 5 pm): [phone] On Call: [phone] and follow prompts after 5 pm and on weekends Office Phone: [phone] Office Fax: [phone]

## 2016-04-23 ENCOUNTER — Encounter: Payer: Self-pay | Admitting: Internal Medicine

## 2016-04-23 ENCOUNTER — Non-Acute Institutional Stay (SKILLED_NURSING_FACILITY): Payer: Medicare Other | Admitting: Internal Medicine

## 2016-04-23 DIAGNOSIS — E785 Hyperlipidemia, unspecified: Secondary | ICD-10-CM

## 2016-04-23 DIAGNOSIS — N400 Enlarged prostate without lower urinary tract symptoms: Secondary | ICD-10-CM

## 2016-04-23 DIAGNOSIS — K5909 Other constipation: Secondary | ICD-10-CM | POA: Diagnosis not present

## 2016-04-23 DIAGNOSIS — M159 Polyosteoarthritis, unspecified: Secondary | ICD-10-CM

## 2016-04-23 DIAGNOSIS — H42 Glaucoma in diseases classified elsewhere: Secondary | ICD-10-CM

## 2016-04-23 DIAGNOSIS — E1121 Type 2 diabetes mellitus with diabetic nephropathy: Secondary | ICD-10-CM

## 2016-04-23 DIAGNOSIS — M150 Primary generalized (osteo)arthritis: Secondary | ICD-10-CM

## 2016-04-23 DIAGNOSIS — G308 Other Alzheimer's disease: Secondary | ICD-10-CM

## 2016-04-23 DIAGNOSIS — F0280 Dementia in other diseases classified elsewhere without behavioral disturbance: Secondary | ICD-10-CM

## 2016-04-23 NOTE — Progress Notes (Signed)
LOCATION: [facility]  PCP: Blanchie Serve, MD   Code Status: DNR  Goals of care: Advanced Directive information Advanced Directives 02/29/2016  Does patient have an advance directive? Yes  Type of Advance Directive Living will;Out of facility DNR (pink MOST or yellow form)  Does patient want to make changes to advanced directive? -  Copy of advanced directive(s) in chart? -  Would patient like information on creating an advanced directive? -  Pre-existing out of facility DNR order (yellow form or pink MOST form) Yellow form placed in chart (order not valid for inpatient use)       Extended Emergency Contact Information Primary Emergency Contact: Gullo,Sandra Address: [address], [place] of [place] Phone: [phone] Work Phone: [phone] Mobile Phone: [phone] Relation: Spouse Secondary Emergency Contact: Zehren,William Address: [address]          [place], [place] [address] Johnnette Litter of [place] Phone: [phone] Mobile Phone: [phone] Relation: Son   No Known Allergies  Chief Complaint  Patient presents with  . New Admit To SNF    New Admission Visit     HPI:  Patient is a 80 y.o. male seen today for Admission visit. He he has been transferred to this facility for long-term care from another facility. I have followed this patient at another facility for few years. The reason for the transfer his the facility being closer to home for the spouse. He has been at his baseline and denies any concern. He has minimal participation in history of present illness and review of system. No new concern from the wife.    Review of Systems: Mostly from wife Constitutional: Negative for fever, chills HENT: Negative for headache, congestion, nasal discharge Eyes: Negative for double vision and discharge.  Respiratory: Negative for cough, shortness of breath and wheezing.   Cardiovascular: Negative for  chest pain, leg swelling.  Gastrointestinal: Negative for heartburn, nausea, vomiting, abdominal pain Genitourinary: Negative for dysuria.  Musculoskeletal: Negative for back pain, fall.  Skin: Negative for itching, rash.  Neurological: Negative for dizziness.    Past Medical History:  Diagnosis Date  . CKD (chronic kidney disease)   . Dementia   . 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 Diabetes mellitus without complication ([facility])   . DVT (deep venous thrombosis) ([facility]) 09/16/2013  . Glaucoma   . Latent tuberculosis 09/16/2013  . Osteoarthritis 09/16/2013  . Prostate cancer ([facility]) 2006   s/p seed radiation  . Pure hypercholesterolemia 10/18/2013   Past Surgical History:  Procedure Laterality Date  . COLONOSCOPY WITH PROPOFOL N/A 01/24/2014   Procedure: COLONOSCOPY WITH PROPOFOL;  Surgeon: Garlan Fair, MD;  Location: [facility];  Service: Endoscopy;  Laterality: N/A;  . ivc filter placement     Social History:   reports that he has never smoked. He has never used smokeless tobacco. He reports that he does not drink alcohol or use drugs.  No family history on file.  Medications:   Medication List       Accurate as of 04/23/16  3:33 PM. Always use your most recent med list.          acetaminophen 500 MG tablet Commonly known as:  TYLENOL Take 500 mg by mouth every 4 (four) hours as needed for mild pain or fever.   atorvastatin 20 MG tablet Commonly known as:  LIPITOR Take 20 mg by mouth daily.   bisacodyl 10  MG suppository Commonly known as:  DULCOLAX Place 10 mg rectally every 3 (three) days.   insulin glargine 100 UNIT/ML injection Commonly known as:  LANTUS Inject 14 Units into the skin at bedtime.   insulin lispro 100 UNIT/ML injection Commonly known as:  HUMALOG Inject 5-10 Units into the skin 4 (four) times daily -  before meals and at bedtime. 0-60= hypoglycemic protocol, 61-149= no insulin, 150-250= 5 units, 251-300= 8 units, 301-350= 10 units, >350=  NOTIFY MD   latanoprost 0.005 % ophthalmic solution Commonly known as:  XALATAN Place 1 drop into both eyes at bedtime. Instill one drop in each eye every night at bedtime for glaucoma. Wait 3-5 minutes between 2 eye med   lisinopril 2.5 MG tablet Commonly known as:  PRINIVIL,ZESTRIL Take 2.5 mg by mouth daily.   NAMENDA XR 28 MG Cp24 24 hr capsule Generic drug:  memantine Take 28 mg by mouth daily.   nystatin cream Commonly known as:  MYCOSTATIN Apply 1 application topically 2 (two) times daily.   sennosides-docusate sodium 8.6-50 MG tablet Commonly known as:  SENOKOT-S Take 2 tablets by mouth at bedtime.   tamsulosin 0.4 MG Caps capsule Commonly known as:  FLOMAX Take 0.4 mg by mouth daily.   UNABLE TO FIND Med Name: Sugar Free Med pass 90 cc by mouth 2 times daily       Immunizations: Immunization History  Administered Date(s) Administered  . Influenza-Unspecified 03/29/2014, 04/14/2015  . PPD Test 12/15/2013, 02/21/2015  . Pneumococcal-Unspecified 04/06/2014     Physical Exam: Vitals:   04/23/16 1530  BP: 109/71  Pulse: 77  Resp: 18  Temp: 97.3 F (36.3 C)  TempSrc: Oral  SpO2: 97%  Weight: 189 lb 3.2 oz (85.8 kg)  Height: 6' (1.829 m)   Body mass index is 25.66 kg/m.   General- elderly male in no acute distress Head- atraumatic, normocephalic Eyes- no pallor, no icterus Mouth- edentulous, moist mucus membrane Neck- no lymphadenopathy Cardiovascular- normal s1,s2, no murmur Respiratory- bilateral clear to auscultation Abdomen- bowel sounds present, soft, non tender Musculoskeletal- able to move all 4 extremities, no leg edema, lower extremity weakness present, under total care Neurological- alert and oriented to self only, has dementia      Labs reviewed: Basic Metabolic Panel:  Recent Labs  02/29/16 2016 03/01/16 0410 03/02/16 0539 03/03/16 0526 03/19/16  NA  --  147* 146* 143 140  K  --  3.6 3.6 4.3 3.9  CL  --  118* 114* 111   --   CO2  --  23 27 25   --   GLUCOSE  --  195* 190* 278*  --   BUN  --  21* 13 10 6   CREATININE  --  1.37* 1.26* 1.25* 1.0  CALCIUM  --  8.6* 8.6* 8.8*  --   MG 2.1  --   --   --   --    Liver Function Tests:  Recent Labs  09/25/15 02/16/16 2304 02/29/16 0225  AST 20 24 25   ALT 22 23 52  ALKPHOS 90 79 97  BILITOT  --  0.9 0.6  PROT  --  7.5 8.4*  ALBUMIN  --  3.8 4.2   No results for input(s): LIPASE, AMYLASE in the last 8760 hours.  Recent Labs  02/29/16 0538  AMMONIA 13   CBC:  Recent Labs  02/29/16 0225 03/01/16 0410 03/02/16 0539 03/03/16 0526 03/19/16  WBC 13.6* 11.2* 6.6 6.3 5.6  NEUTROABS 11.6*  --   --   --   --  HGB 15.0 13.5 12.9* 12.3* 12.0*  HCT 44.5 40.5 38.6* 37.1* 36*  MCV 91.4 92.3 90.6 92.1  --   PLT 305 261 221 198 204   Cardiac Enzymes:  Recent Labs  02/29/16 0225  TROPONINI 0.17*   BNP: Invalid input(s): POCBNP CBG:  Recent Labs  03/03/16 2216 03/04/16 0741 03/04/16 1152  GLUCAP 186* 207* 302*     Assessment/Plan  Type 2 diabetes mellitus with microalbuminuria Continue Lantus 14 units at bedtime and Humalog sliding scale insulin with meals. Continue lisinopril  BPH Continue Flomax  Advanced dementia without behavioral disturbance Continue his Namenda  Chronic constipation Continue Dulcolax suppository every 3 day if needed. Continue Senokot-s to tablet daily.  Osteoarthritis Continue Tylenol on a needed basis and monitor  Hyperlipidemia Continue atorvastatin  Glaucoma Continue his latanoprost eyedrops    Goals of care: Long-term care   Labs/tests ordered: None  Family/ staff Communication: reviewed care plan with patient and nursing supervisor    Blanchie Serve, MD Internal Medicine [address] [place], [place] [address] Cell Phone (Monday-Friday 8 am - 5 pm): [phone] On Call: [phone] and follow prompts after 5 pm and on weekends Office  Phone: [phone] Office Fax: [phone]

## 2016-06-03 ENCOUNTER — Encounter: Payer: Self-pay | Admitting: Internal Medicine

## 2016-06-03 ENCOUNTER — Non-Acute Institutional Stay (SKILLED_NURSING_FACILITY): Payer: Medicare Other | Admitting: Internal Medicine

## 2016-06-03 DIAGNOSIS — G308 Other Alzheimer's disease: Secondary | ICD-10-CM | POA: Diagnosis not present

## 2016-06-03 DIAGNOSIS — N400 Enlarged prostate without lower urinary tract symptoms: Secondary | ICD-10-CM

## 2016-06-03 DIAGNOSIS — H42 Glaucoma in diseases classified elsewhere: Secondary | ICD-10-CM | POA: Diagnosis not present

## 2016-06-03 DIAGNOSIS — F0280 Dementia in other diseases classified elsewhere without behavioral disturbance: Secondary | ICD-10-CM | POA: Diagnosis not present

## 2016-06-03 NOTE — Progress Notes (Signed)
LOCATION: [facility]   PCP: Blanchie Serve, MD   Code Status: DNR  Goals of care: Advanced Directive information Advanced Directives 02/29/2016  Does Patient Have a Medical Advance Directive? Yes  Type of Advance Directive Living will;Out of facility DNR (pink MOST or yellow form)  Does patient want to make changes to medical advance directive? -  Copy of [place] in Chart? -  Would patient like information on creating a medical advance directive? -  Pre-existing out of facility DNR order (yellow form or pink MOST form) Yellow form placed in chart (order not valid for inpatient use)       Extended Emergency Contact Information Primary Emergency Contact: Rawles,Sandra Address: [place], [place] of [place] Phone: [phone] Work Phone: [phone] Mobile Phone: [phone] Relation: Spouse Secondary Emergency Contact: Czaplicki,William Address: [address]          [place], [place] [address] Johnnette Litter of [place] Phone: [phone] Mobile Phone: [phone] Relation: Son   No Known Allergies  Chief Complaint  Patient presents with  . Medical Management of Chronic Issues    Routine Visit     HPI:  Patient is a 80 y.o. male seen today for routine visit. He has been at his baseline. He does refuse care at times. He has dementia and does not participate in HPI and ROS.   Review of Systems: unable to obtain due to advance dementia.     Past Medical History:  Diagnosis Date  . CKD (chronic kidney disease)   . Dementia   . 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 Diabetes mellitus without complication ([facility])   . DVT (deep venous thrombosis) ([facility]) 09/16/2013  . Glaucoma   . Latent tuberculosis 09/16/2013  . Osteoarthritis 09/16/2013  . Prostate cancer ([facility]) 2006   s/p seed radiation  . Pure hypercholesterolemia 10/18/2013   Past Surgical History:  Procedure Laterality Date  .  COLONOSCOPY WITH PROPOFOL N/A 01/24/2014   Procedure: COLONOSCOPY WITH PROPOFOL;  Surgeon: Garlan Fair, MD;  [facility];  Service: Endoscopy;  Laterality: N/A;  . ivc filter placement      Medications: Allergies as of 06/03/2016   No Known Allergies     Medication List       Accurate as of 06/03/16  2:53 PM. Always use your most recent med list.          acetaminophen 500 MG tablet Commonly known as:  TYLENOL Take 500 mg by mouth every 4 (four) hours as needed for mild pain or fever.   atorvastatin 20 MG tablet Commonly known as:  LIPITOR Take 20 mg by mouth daily.   bisacodyl 10 MG suppository Commonly known as:  DULCOLAX Place 10 mg rectally every 3 (three) days.   citalopram 10 MG tablet Commonly known as:  CELEXA Take 10 mg by mouth daily.   donepezil 10 MG tablet Commonly known as:  ARICEPT Take 10 mg by mouth at bedtime.   insulin glargine 100 UNIT/ML injection Commonly known as:  LANTUS Inject 14 Units into the skin at bedtime.   insulin lispro 100 UNIT/ML injection Commonly known as:  HUMALOG Inject 5-10 Units into the skin 4 (four) times daily -  before meals and at bedtime. 0-60= hypoglycemic protocol, 61-149= no insulin, 150-250= 5 units, 251-300= 8 units, 301-350= 10 units, >350= NOTIFY MD   latanoprost 0.005 % ophthalmic solution Commonly known as:  XALATAN Place 1 drop into both eyes at bedtime. Instill one drop in each eye every night at bedtime for glaucoma. Wait 3-5 minutes between 2 eye med   lisinopril 2.5 MG tablet Commonly known as:  PRINIVIL,ZESTRIL Take 2.5 mg by mouth daily.   NAMENDA XR 28 MG Cp24 24 hr capsule Generic drug:  memantine Take 28 mg by mouth daily.   sennosides-docusate sodium 8.6-50 MG tablet Commonly known as:  SENOKOT-S Take 2 tablets by mouth at bedtime.   tamsulosin 0.4 MG Caps capsule Commonly known as:  FLOMAX Take 0.4 mg by mouth daily.   UNABLE TO FIND Med Name: Sugar Free Med pass 90 cc  by mouth 2 times daily       Immunizations: Immunization History  Administered Date(s) Administered  . Influenza-Unspecified 03/29/2014, 04/14/2015  . PPD Test 12/15/2013, 02/21/2015  . Pneumococcal-Unspecified 04/06/2014     Physical Exam:  Vitals:   06/03/16 1448  BP: 115/67  Pulse: 73  Resp: 16  Temp: 98.6 F (37 C)  TempSrc: Oral  Weight: 197 lb 11.2 oz (89.7 kg)  Height: 6' (1.829 m)   Body mass index is 26.81 kg/m.  General- elderly male in no acute distress Head- atraumatic, normocephalic Eyes- no pallor, no icterus Mouth- edentulous, moist mucus membrane Neck- no lymphadenopathy Cardiovascular- normal s1,s2, no murmur Respiratory- bilateral clear to auscultation Abdomen- bowel sounds present, soft, non tender Musculoskeletal- able to move all 4 extremities, no leg edema, lower extremity weakness present, under total care Neurological- alert and oriented to self only, has dementia    Labs reviewed: Basic Metabolic Panel:  Recent Labs  02/29/16 2016 03/01/16 0410 03/02/16 0539 03/03/16 0526 03/19/16  NA  --  147* 146* 143 140  K  --  3.6 3.6 4.3 3.9  CL  --  118* 114* 111  --   CO2  --  23 27 25   --   GLUCOSE  --  195* 190* 278*  --   BUN  --  21* 13 10 6   CREATININE  --  1.37* 1.26* 1.25* 1.0  CALCIUM  --  8.6* 8.6* 8.8*  --   MG 2.1  --   --   --   --    Liver Function Tests:  Recent Labs  09/25/15 02/16/16 2304 02/29/16 0225  AST 20 24 25   ALT 22 23 52  ALKPHOS 90 79 97  BILITOT  --  0.9 0.6  PROT  --  7.5 8.4*  ALBUMIN  --  3.8 4.2   No results for input(s): LIPASE, AMYLASE in the last 8760 hours.  Recent Labs  02/29/16 0538  AMMONIA 13   CBC:  Recent Labs  02/29/16 0225 03/01/16 0410 03/02/16 0539 03/03/16 0526 03/19/16  WBC 13.6* 11.2* 6.6 6.3 5.6  NEUTROABS 11.6*  --   --   --   --   HGB 15.0 13.5 12.9* 12.3* 12.0*  HCT 44.5 40.5 38.6* 37.1* 36*  MCV 91.4 92.3 90.6 92.1  --   PLT 305 261 221 198 204    Cardiac Enzymes:  Recent Labs  02/29/16 0225  TROPONINI 0.17*   BNP: Invalid input(s): POCBNP CBG:  Recent Labs  03/03/16 2216 03/04/16 0741 03/04/16 1152  GLUCAP 186* 207* 302*    Radiological Exams: No results found.  Assessment/Plan  BPH Stable, continue his Flomax.  Alzheimer's dementia without behavioral disturbance Stable, provide supportive care, continue Namenda 528 mg daily and Aricept 10 mg daily.  Glaucoma Stable. Continue his  latanoprost eyedrops and monitor.    Family/ staff Communication: reviewed care plan with patient's wife and nursing supervisor    Blanchie Serve, MD Internal Medicine [place], [place] [address] Cell Phone (Monday-Friday 8 am - 5 pm): [phone] On Call: [phone] and follow prompts after 5 pm and on weekends Office Phone: [phone] Office Fax: [phone]

## 2016-06-19 LAB — MICROALBUMIN, URINE: MICROALB UR: 10.9

## 2016-06-24 ENCOUNTER — Encounter: Payer: Self-pay | Admitting: Internal Medicine

## 2016-06-24 ENCOUNTER — Non-Acute Institutional Stay (SKILLED_NURSING_FACILITY): Payer: Medicare Other | Admitting: Internal Medicine

## 2016-06-24 DIAGNOSIS — G4709 Other insomnia: Secondary | ICD-10-CM | POA: Diagnosis not present

## 2016-06-24 DIAGNOSIS — E1121 Type 2 diabetes mellitus with diabetic nephropathy: Secondary | ICD-10-CM | POA: Diagnosis not present

## 2016-06-24 DIAGNOSIS — E785 Hyperlipidemia, unspecified: Secondary | ICD-10-CM | POA: Diagnosis not present

## 2016-06-24 NOTE — Progress Notes (Signed)
LOC[facility]   PCP: Blanchie Serve, MD   Code Status: DNR  Goals of care: Advanced Directive information Advanced Directives 02/29/2016  Does Patient Have a Medical Advance Directive? Yes  Type of Advance Directive Living will;Out of facility DNR (pink MOST or yellow form)  Does patient want to make changes to medical advance directive? -  Copy of [place] in Chart? -  Would patient like information on creating a medical advance directive? -  Pre-existing out of facility DNR order (yellow form or pink MOST form) Yellow form placed in chart (order not valid for inpatient use)       Extended Emergency Contact Information Primary Emergency Contact: Udell,Sandra Address: [place], [place] of [place] Phone: [phone] Work Phone: [phone] Mobile Phone: [phone] Relation: Spouse Secondary Emergency Contact: Cassels,William Address: [address]          [place], [place] [address] Johnnette Litter of [place] Phone: [phone] Mobile Phone: [phone] Relation: Son   No Known Allergies  Chief Complaint  Patient presents with  . Medical Management of Chronic Issues    Routine Visit      HPI:  Patient is a 81 y.o. male seen today for routine visit. He has been at his baseline. He denies any concerns this visit. His wife is present at bedside.  Review of Systems:  Constitutional: Negative for fever, chills HENT: Negative for congestion Respiratory: Negative for cough, shortness of breath  Cardiovascular: Negative for chest pain, palpitation Gastrointestinal: Negative for heartburn, nausea, vomiting, abdominal pain Genitourinary: Negative for dysuria  Musculoskeletal: Negative for back pain, falls.  Skin: Negative for itching and rash.    Past Medical History:  Diagnosis Date  . CKD (chronic kidney disease)   . Dementia   . 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  Diabetes mellitus without complication ([facility])   . DVT (deep venous thrombosis) ([facility]) 09/16/2013  . Glaucoma   . Latent tuberculosis 09/16/2013  . Osteoarthritis 09/16/2013  . Prostate cancer ([facility]) 2006   s/p seed radiation  . Pure hypercholesterolemia 10/18/2013   Past Surgical History:  Procedure Laterality Date  . COLONOSCOPY WITH PROPOFOL N/A 01/24/2014   Procedure: COLONOSCOPY WITH PROPOFOL;  Surgeon: Garlan Fair, MD;  [facility];  Service: Endoscopy;  Laterality: N/A;  . ivc filter placement      Medications: Allergies as of 06/24/2016   No Known Allergies     Medication List       Accurate as of 06/24/16  3:21 PM. Always use your most recent med list.          acetaminophen 500 MG tablet Commonly known as:  TYLENOL Take 500 mg by mouth every 4 (four) hours as needed for mild pain or fever.   atorvastatin 20 MG tablet Commonly known as:  LIPITOR Take 20 mg by mouth daily.   bisacodyl 10 MG suppository Commonly known as:  DULCOLAX Place 10 mg rectally every 3 (three) days.   citalopram 10 MG tablet Commonly known as:  CELEXA Take 10 mg by mouth daily.   donepezil 10 MG tablet Commonly known as:  ARICEPT Take 10 mg by mouth at bedtime.   insulin glargine 100 UNIT/ML injection Commonly known as:  LANTUS Inject 14 Units into the skin at bedtime.   insulin lispro 100 UNIT/ML injection Commonly known as:  HUMALOG Inject 5-10 Units into the skin 4 (four)  times daily -  before meals and at bedtime. 0-60= hypoglycemic protocol, 61-149= no insulin, 150-250= 5 units, 251-300= 8 units, 301-350= 10 units, >350= NOTIFY MD   latanoprost 0.005 % ophthalmic solution Commonly known as:  XALATAN Place 1 drop into both eyes at bedtime. Instill one drop in each eye every night at bedtime for glaucoma. Wait 3-5 minutes between 2 eye med   lisinopril 2.5 MG tablet Commonly known as:  PRINIVIL,ZESTRIL Take 2.5 mg by mouth daily.   Melatonin 3 MG Tabs Take 1 tablet by  mouth at bedtime.   NAMENDA XR 28 MG Cp24 24 hr capsule Generic drug:  memantine Take 28 mg by mouth daily.   saccharomyces boulardii 250 MG capsule Commonly known as:  FLORASTOR Take 250 mg by mouth 2 (two) times daily. Stop date 06/29/16   sennosides-docusate sodium 8.6-50 MG tablet Commonly known as:  SENOKOT-S Take 2 tablets by mouth at bedtime.   tamsulosin 0.4 MG Caps capsule Commonly known as:  FLOMAX Take 0.4 mg by mouth daily.   UNABLE TO FIND Med Name: Sugar Free Med pass 90 cc by mouth 2 times daily       Immunizations: Immunization History  Administered Date(s) Administered  . Influenza-Unspecified 03/29/2014, 04/14/2015  . PPD Test 12/15/2013, 02/21/2015  . Pneumococcal-Unspecified 04/06/2014     Physical Exam:  Vitals:   06/24/16 1516  BP: 116/62  Pulse: 82  Resp: 20  Temp: (!) 96.7 F (35.9 C)  TempSrc: Oral  SpO2: 98%  Weight: 197 lb 11.2 oz (89.7 kg)  Height: 6' (1.829 m)   Body mass index is 26.81 kg/m.  General- elderly male in no acute distress Head- atraumatic, normocephalic Eyes- no pallor, no icterus Mouth- edentulous, moist mucus membrane Neck- no lymphadenopathy Cardiovascular- normal s1,s2, no murmur Respiratory- bilateral clear to auscultation Abdomen- bowel sounds present, soft, non tender Musculoskeletal- able to move all 4 extremities, no leg edema, lower extremity weakness present, on wheelchair Neurological- alert and oriented to self only, has dementia    Labs reviewed: Basic Metabolic Panel:  Recent Labs  02/29/16 2016 03/01/16 0410 03/02/16 0539 03/03/16 0526 03/19/16  NA  --  147* 146* 143 140  K  --  3.6 3.6 4.3 3.9  CL  --  118* 114* 111  --   CO2  --  23 27 25   --   GLUCOSE  --  195* 190* 278*  --   BUN  --  21* 13 10 6   CREATININE  --  1.37* 1.26* 1.25* 1.0  CALCIUM  --  8.6* 8.6* 8.8*  --   MG 2.1  --   --   --   --    Liver Function Tests:  Recent Labs  09/25/15 02/16/16 2304 02/29/16 0225    AST 20 24 25   ALT 22 23 52  ALKPHOS 90 79 97  BILITOT  --  0.9 0.6  PROT  --  7.5 8.4*  ALBUMIN  --  3.8 4.2   No results for input(s): LIPASE, AMYLASE in the last 8760 hours.  Recent Labs  02/29/16 0538  AMMONIA 13   CBC:  Recent Labs  02/29/16 0225 03/01/16 0410 03/02/16 0539 03/03/16 0526 03/19/16  WBC 13.6* 11.2* 6.6 6.3 5.6  NEUTROABS 11.6*  --   --   --   --   HGB 15.0 13.5 12.9* 12.3* 12.0*  HCT 44.5 40.5 38.6* 37.1* 36*  MCV 91.4 92.3 90.6 92.1  --   PLT 305 261  221 198 204   Cardiac Enzymes:  Recent Labs  02/29/16 0225  TROPONINI 0.17*   BNP: Invalid input(s): POCBNP CBG:  Recent Labs  03/03/16 2216 03/04/16 0741 03/04/16 1152  GLUCAP 186* 207* 302*    Radiological Exams: No results found.  Assessment/Plan  Insomnia He has been sleeping well at nighttime. Continue his melatonin.  Type 2 diabetes mellitus Lab Results  Component Value Date   HGBA1C 11.4 03/19/2016    A1c is suggestive of poorly controlled diabetes. CBG on review have blood sugar mostly above 150 but below 270. Currently on Lantus 14 units daily and sliding scale insulin Humalog with meals. Change Lantus to 18 units daily for now. Continue lisinopril 2.5 mg daily because of his microalbuminuria for renal protection. Continue atorvastatin. Monitor blood sugar reading. Check a1c.   Hyperlipidemia Lipid Panel     Component Value Date/Time   CHOL 171 09/25/2015   TRIG 168 (A) 09/25/2015   HDL 33 (A) 09/25/2015   LDLCALC 105 09/25/2015  Check lipid panel. Continue atorvastatin for now.   Family/ staff Communication: reviewed care plan with patient's wife and nursing supervisor    Blanchie Serve, MD Internal Medicine [place], [place] [address] Cell Phone (Monday-Friday 8 am - 5 pm): [phone] On Call: [phone] and follow prompts after 5 pm and on weekends Office Phone: [phone] Office Fax:  [phone]

## 2016-07-05 LAB — LIPID PANEL
Cholesterol: 151 mg/dL (ref 0–200)
HDL: 41 mg/dL (ref 35–70)
LDL CALC: 88 mg/dL
TRIGLYCERIDES: 113 mg/dL (ref 40–160)

## 2016-07-05 LAB — HEMOGLOBIN A1C: Hemoglobin A1C: 8.2

## 2016-09-05 ENCOUNTER — Non-Acute Institutional Stay (SKILLED_NURSING_FACILITY): Payer: Medicare Other | Admitting: Internal Medicine

## 2016-09-05 ENCOUNTER — Encounter: Payer: Self-pay | Admitting: Internal Medicine

## 2016-09-05 DIAGNOSIS — F329 Major depressive disorder, single episode, unspecified: Secondary | ICD-10-CM | POA: Diagnosis not present

## 2016-09-05 DIAGNOSIS — R809 Proteinuria, unspecified: Secondary | ICD-10-CM | POA: Diagnosis not present

## 2016-09-05 DIAGNOSIS — G308 Other Alzheimer's disease: Secondary | ICD-10-CM | POA: Diagnosis not present

## 2016-09-05 DIAGNOSIS — E1129 Type 2 diabetes mellitus with other diabetic kidney complication: Secondary | ICD-10-CM | POA: Diagnosis not present

## 2016-09-05 DIAGNOSIS — F0280 Dementia in other diseases classified elsewhere without behavioral disturbance: Secondary | ICD-10-CM | POA: Diagnosis not present

## 2016-09-05 NOTE — Progress Notes (Signed)
LOCATION: [facility]   PCP: Blanchie Serve, MD   Code Status: DNR  Goals of care: Advanced Directive information Advanced Directives 02/29/2016  Does Patient Have a Medical Advance Directive? Yes  Type of Advance Directive Living will;Out of facility DNR (pink MOST or yellow form)  Does patient want to make changes to medical advance directive? -  Copy of [place] in Chart? -  Would patient like information on creating a medical advance directive? -  Pre-existing out of facility DNR order (yellow form or pink MOST form) Yellow form placed in chart (order not valid for inpatient use)       Extended Emergency Contact Information Primary Emergency Contact: Rhome,Sandra Address: [place], [place] of [place] Phone: [phone] Work Phone: [phone] Mobile Phone: [phone] Relation: Spouse Secondary Emergency Contact: Swicegood,William Address: [address]          [place], [place] [address] Johnnette Litter of [place] Phone: [phone] Mobile Phone: [phone] Relation: Son   No Known Allergies  Chief Complaint  Patient presents with  . Medical Management of Chronic Issues    Routine Visit      HPI:  Patient is a 81 y.o. male seen today for routine visit. He has been at his baseline. He denies any concerns this visit. His wife is present at bedside.  Review of Systems: limited with dementia Constitutional: Negative for fever, chills HENT: Negative for congestion Respiratory: Negative for cough, shortness of breath  Cardiovascular: Negative for chest pain, palpitation Gastrointestinal: Negative for nausea, vomiting, abdominal pain Genitourinary: Negative for dysuria  Musculoskeletal: Negative for back pain, falls.  Skin: Negative for itching and rash.    Past Medical History:  Diagnosis Date  . CKD (chronic kidney disease)   . Dementia   . Depression due to dementia 09/16/2013    . Diabetes mellitus without complication ([facility])   . DVT (deep venous thrombosis) ([facility]) 09/16/2013  . Glaucoma   . Latent tuberculosis 09/16/2013  . Osteoarthritis 09/16/2013  . Prostate cancer ([facility]) 2006   s/p seed radiation  . Pure hypercholesterolemia 10/18/2013   Past Surgical History:  Procedure Laterality Date  . COLONOSCOPY WITH PROPOFOL N/A 01/24/2014   Procedure: COLONOSCOPY WITH PROPOFOL;  Surgeon: Garlan Fair, MD;  [facility];  Service: Endoscopy;  Laterality: N/A;  . ivc filter placement      Medications: Allergies as of 09/05/2016   No Known Allergies     Medication List       Accurate as of 09/05/16  2:52 PM. Always use your most recent med list.          acetaminophen 500 MG tablet Commonly known as:  TYLENOL Take 500 mg by mouth every 4 (four) hours as needed for mild pain or fever.   atorvastatin 20 MG tablet Commonly known as:  LIPITOR Take 20 mg by mouth daily.   bisacodyl 10 MG suppository Commonly known as:  DULCOLAX Place 10 mg rectally every 3 (three) days.   citalopram 10 MG tablet Commonly known as:  CELEXA Take 10 mg by mouth daily.   docusate sodium 100 MG capsule Commonly known as:  COLACE Take 100 mg by mouth at bedtime.   donepezil 10 MG tablet Commonly known as:  ARICEPT Take 10 mg by mouth at bedtime.   insulin glargine 100 UNIT/ML injection Commonly known as:  LANTUS Inject 18 Units into the skin at  bedtime.   insulin lispro 100 UNIT/ML injection Commonly known as:  HUMALOG Inject 5-10 Units into the skin 4 (four) times daily -  before meals and at bedtime. 0-60= hypoglycemic protocol, 61-149= no insulin, 150-250= 5 units, 251-300= 8 units, 301-350= 10 units, >350= NOTIFY MD   latanoprost 0.005 % ophthalmic solution Commonly known as:  XALATAN Place 1 drop into both eyes at bedtime. Instill one drop in each eye every night at bedtime for glaucoma. Wait 3-5 minutes between 2 eye med   lisinopril 2.5 MG  tablet Commonly known as:  PRINIVIL,ZESTRIL Take 2.5 mg by mouth daily.   Melatonin 3 MG Tabs Take 1 tablet by mouth at bedtime.   NAMENDA XR 28 MG Cp24 24 hr capsule Generic drug:  memantine Take 28 mg by mouth daily.   sennosides-docusate sodium 8.6-50 MG tablet Commonly known as:  SENOKOT-S Take 2 tablets by mouth at bedtime.   tamsulosin 0.4 MG Caps capsule Commonly known as:  FLOMAX Take 0.4 mg by mouth daily.       Immunizations: Immunization History  Administered Date(s) Administered  . Influenza-Unspecified 03/29/2014, 04/14/2015, 07/10/2016  . PPD Test 12/15/2013, 02/21/2015  . Pneumococcal-Unspecified 04/06/2014     Physical Exam:  Vitals:   09/05/16 1433  BP: 134/70  Pulse: 63  Resp: 20  Weight: 211 lb (95.7 kg)  Height: 6' (1.829 m)   Body mass index is 28.62 kg/m.  General- elderly male in no acute distress Head- atraumatic, normocephalic Eyes- no pallor, no icterus Mouth- edentulous, moist mucus membrane Neck- no lymphadenopathy Cardiovascular- normal s1,s2, no murmur Respiratory- bilateral clear to auscultation Abdomen- bowel sounds present, soft, non tender Musculoskeletal- able to move all 4 extremities, no leg edema, lower extremity weakness present, on wheelchair Neurological- alert and oriented to self only, has dementia    Labs reviewed: Basic Metabolic Panel:  Recent Labs  02/29/16 2016 03/01/16 0410 03/02/16 0539 03/03/16 0526 03/19/16  NA  --  147* 146* 143 140  K  --  3.6 3.6 4.3 3.9  CL  --  118* 114* 111  --   CO2  --  23 27 25   --   GLUCOSE  --  195* 190* 278*  --   BUN  --  21* 13 10 6   CREATININE  --  1.37* 1.26* 1.25* 1.0  CALCIUM  --  8.6* 8.6* 8.8*  --   MG 2.1  --   --   --   --    Liver Function Tests:  Recent Labs  09/25/15 02/16/16 2304 02/29/16 0225  AST 20 24 25   ALT 22 23 52  ALKPHOS 90 79 97  BILITOT  --  0.9 0.6  PROT  --  7.5 8.4*  ALBUMIN  --  3.8 4.2   No results for input(s): LIPASE,  AMYLASE in the last 8760 hours.  Recent Labs  02/29/16 0538  AMMONIA 13   CBC:  Recent Labs  02/29/16 0225 03/01/16 0410 03/02/16 0539 03/03/16 0526 03/19/16  WBC 13.6* 11.2* 6.6 6.3 5.6  NEUTROABS 11.6*  --   --   --   --   HGB 15.0 13.5 12.9* 12.3* 12.0*  HCT 44.5 40.5 38.6* 37.1* 36*  MCV 91.4 92.3 90.6 92.1  --   PLT 305 261 221 198 204   Cardiac Enzymes:  Recent Labs  02/29/16 0225  TROPONINI 0.17*   BNP: Invalid input(s): POCBNP CBG:  Recent Labs  03/03/16 2216 03/04/16 0741 03/04/16 1152  GLUCAP 186*  207* 302*    Radiological Exams: No results found.  Assessment/Plan  Chronic depression related to dementia. Continue celexa 10 mg daily  Alzheimer's dementia Continue namenda xr and aricept for now. Supportive care.  Microalbuminuria BP holding stable, continue lisinopril for renal protection with his hx of DM   Family/ staff Communication: reviewed care plan with patient's wife and nursing supervisor    Blanchie Serve, MD Internal Medicine [place], [place] [address] Cell Phone (Monday-Friday 8 am - 5 pm): [phone] On Call: [phone] and follow prompts after 5 pm and on weekends Office Phone: [phone] Office Fax: [phone]

## 2016-09-18 LAB — BASIC METABOLIC PANEL
BUN: 11 mg/dL (ref 4–21)
Creatinine: 1.2 mg/dL (ref 0.6–1.3)
GLUCOSE: 116 mg/dL
Potassium: 4.3 mmol/L (ref 3.4–5.3)
Sodium: 142 mmol/L (ref 137–147)

## 2016-09-18 LAB — CBC AND DIFFERENTIAL
HEMATOCRIT: 42 % (ref 41–53)
Hemoglobin: 13.7 g/dL (ref 13.5–17.5)
PLATELETS: 205 10*3/uL (ref 150–399)
WBC: 5.8 10*3/mL

## 2016-09-18 LAB — HEMOGLOBIN A1C: Hemoglobin A1C: 7.2

## 2016-10-09 ENCOUNTER — Encounter: Payer: Self-pay | Admitting: Internal Medicine

## 2016-10-09 ENCOUNTER — Non-Acute Institutional Stay (SKILLED_NURSING_FACILITY): Payer: Medicare Other | Admitting: Internal Medicine

## 2016-10-09 DIAGNOSIS — H42 Glaucoma in diseases classified elsewhere: Secondary | ICD-10-CM

## 2016-10-09 DIAGNOSIS — E785 Hyperlipidemia, unspecified: Secondary | ICD-10-CM | POA: Diagnosis not present

## 2016-10-09 DIAGNOSIS — Z794 Long term (current) use of insulin: Secondary | ICD-10-CM | POA: Diagnosis not present

## 2016-10-09 DIAGNOSIS — E119 Type 2 diabetes mellitus without complications: Secondary | ICD-10-CM | POA: Diagnosis not present

## 2016-10-09 DIAGNOSIS — N400 Enlarged prostate without lower urinary tract symptoms: Secondary | ICD-10-CM

## 2016-10-09 DIAGNOSIS — IMO0001 Reserved for inherently not codable concepts without codable children: Secondary | ICD-10-CM

## 2016-10-09 NOTE — Progress Notes (Signed)
LOCATION: [facility]   PCP: Blanchie Serve, MD   Code Status: DNR  Goals of care: Advanced Directive information Advanced Directives 02/29/2016  Does Patient Have a Medical Advance Directive? Yes  Type of Advance Directive Living will;Out of facility DNR (pink MOST or yellow form)  Does patient want to make changes to medical advance directive? -  Copy of [place] in Chart? -  Would patient like information on creating a medical advance directive? -  Pre-existing out of facility DNR order (yellow form or pink MOST form) Yellow form placed in chart (order not valid for inpatient use)       Extended Emergency Contact Information Primary Emergency Contact: Devin,Sandra Address: [place], [place] of [place] Phone: [phone] Work Phone: [phone] Mobile Phone: [phone] Relation: Spouse Secondary Emergency Contact: Findlay,William Address: [address]          [place], [place] [address] Johnnette Litter of [place] Phone: [phone] Mobile Phone: [phone] Relation: Son   No Known Allergies  Chief Complaint  Patient presents with  . Medical Management of Chronic Issues    Routine Visit      HPI:  Patient is a 81 y.o. male seen today for routine visit. He has been at his baseline. He denies any concerns this visit. He has dementia.  Review of Systems: limited with dementia Constitutional: Negative for fever HENT: Negative for congestion Respiratory: Negative for cough, shortness of breath  Cardiovascular: Negative for chest pain, palpitation Gastrointestinal: Negative for nausea, vomiting, abdominal pain Genitourinary: Negative for dysuria  Musculoskeletal: Negative for back pain, falls.    Past Medical History:  Diagnosis Date  . CKD (chronic kidney disease)   . Dementia   . 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 Diabetes mellitus without complication ([facility])   . DVT  (deep venous thrombosis) ([facility]) 09/16/2013  . Glaucoma   . Latent tuberculosis 09/16/2013  . Osteoarthritis 09/16/2013  . Prostate cancer ([facility]) 2006   s/p seed radiation  . Pure hypercholesterolemia 10/18/2013   Past Surgical History:  Procedure Laterality Date  . COLONOSCOPY WITH PROPOFOL N/A 01/24/2014   Procedure: COLONOSCOPY WITH PROPOFOL;  Surgeon: Garlan Fair, MD;  [facility];  Service: Endoscopy;  Laterality: N/A;  . ivc filter placement      Medications: Allergies as of 10/09/2016   No Known Allergies     Medication List       Accurate as of 10/09/16  1:30 PM. Always use your most recent med list.          acetaminophen 500 MG tablet Commonly known as:  TYLENOL Take 500 mg by mouth every 4 (four) hours as needed for mild pain or fever.   atorvastatin 20 MG tablet Commonly known as:  LIPITOR Take 20 mg by mouth daily.   bisacodyl 10 MG suppository Commonly known as:  DULCOLAX Place 10 mg rectally every 3 (three) days.   Cholecalciferol 1000 units tablet Take 1,000 Units by mouth daily.   citalopram 10 MG tablet Commonly known as:  CELEXA Take 10 mg by mouth daily.   docusate sodium 100 MG capsule Commonly known as:  COLACE Take 100 mg by mouth at bedtime.   donepezil 10 MG tablet Commonly known as:  ARICEPT Take 10 mg by mouth at bedtime.   insulin glargine 100 UNIT/ML injection Commonly known as:  LANTUS Inject 18 Units into the skin at  bedtime.   insulin lispro 100 UNIT/ML injection Commonly known as:  HUMALOG Inject 5-10 Units into the skin 4 (four) times daily -  before meals and at bedtime. 0-60= hypoglycemic protocol, 61-149= no insulin, 150-250= 5 units, 251-300= 8 units, 301-350= 10 units, >350= NOTIFY MD   latanoprost 0.005 % ophthalmic solution Commonly known as:  XALATAN Place 1 drop into both eyes at bedtime. Instill one drop in each eye every night at bedtime for glaucoma. Wait 3-5 minutes between 2 eye med   lisinopril 2.5  MG tablet Commonly known as:  PRINIVIL,ZESTRIL Take 2.5 mg by mouth daily.   Melatonin 3 MG Tabs Take 1 tablet by mouth at bedtime.   NAMENDA XR 28 MG Cp24 24 hr capsule Generic drug:  memantine Take 28 mg by mouth daily.   sennosides-docusate sodium 8.6-50 MG tablet Commonly known as:  SENOKOT-S Take 2 tablets by mouth at bedtime.   tamsulosin 0.4 MG Caps capsule Commonly known as:  FLOMAX Take 0.4 mg by mouth daily.       Immunizations: Immunization History  Administered Date(s) Administered  . Influenza-Unspecified 03/29/2014, 04/14/2015, 07/10/2016  . PPD Test 12/15/2013, 02/21/2015  . Pneumococcal-Unspecified 04/06/2014     Physical Exam:  Vitals:   10/09/16 1319  BP: 113/73  Pulse: 72  Resp: 20  Weight: 206 lb 6.4 oz (93.6 kg)  Height: 6' (1.829 m)   Body mass index is 27.99 kg/m.  General- elderly male in no acute distress Head- atraumatic, normocephalic Eyes- no pallor, no icterus Mouth- edentulous, moist mucus membrane Neck- no lymphadenopathy Cardiovascular- normal s1,s2, no murmur Respiratory- bilateral clear to auscultation Abdomen- bowel sounds present, soft, non tender Musculoskeletal- able to move all 4 extremities, no leg edema, on wheelchair Neurological- alert and oriented to self only, has dementia    Labs reviewed: Basic Metabolic Panel:  Recent Labs  02/29/16 2016 03/01/16 0410 03/02/16 0539 03/03/16 0526 03/19/16 09/18/16  NA  --  147* 146* 143 140 142  K  --  3.6 3.6 4.3 3.9 4.3  CL  --  118* 114* 111  --   --   CO2  --  23 27 25   --   --   GLUCOSE  --  195* 190* 278*  --   --   BUN  --  21* 13 10 6 11   CREATININE  --  1.37* 1.26* 1.25* 1.0 1.2  CALCIUM  --  8.6* 8.6* 8.8*  --   --   MG 2.1  --   --   --   --   --    Liver Function Tests:  Recent Labs  02/16/16 2304 02/29/16 0225  AST 24 25  ALT 23 52  ALKPHOS 79 97  BILITOT 0.9 0.6  PROT 7.5 8.4*  ALBUMIN 3.8 4.2   No results for input(s): LIPASE, AMYLASE  in the last 8760 hours.  Recent Labs  02/29/16 0538  AMMONIA 13   CBC:  Recent Labs  02/29/16 0225 03/01/16 0410 03/02/16 0539 03/03/16 0526 03/19/16 09/18/16  WBC 13.6* 11.2* 6.6 6.3 5.6 5.8  NEUTROABS 11.6*  --   --   --   --   --   HGB 15.0 13.5 12.9* 12.3* 12.0* 13.7  HCT 44.5 40.5 38.6* 37.1* 36* 42  MCV 91.4 92.3 90.6 92.1  --   --   PLT 305 261 221 198 204 205   Cardiac Enzymes:  Recent Labs  02/29/16 0225  TROPONINI 0.17*   BNP: Invalid  input(s): POCBNP CBG:  Recent Labs  03/03/16 2216 03/04/16 0741 03/04/16 1152  GLUCAP 186* 207* 302*     Assessment/Plan  IDDM Lab Results  Component Value Date   HGBA1C 7.2 09/18/2016   a1c and cbg suggestive of controlled diabetes. Currently on lantus 18 u daily and SSI humalog. continue ACEI for renal protection.  BPH No urinary retention, continue flomax  Glaucoma Continue latanoprost eye drops and monitor  Hyperlipidemia Lipid Panel     Component Value Date/Time   CHOL 151 07/05/2016   TRIG 113 07/05/2016   HDL 41 07/05/2016   LDLCALC 88 07/05/2016   LDL at goal. Continue atorvastatin 20 mg daily    Blanchie Serve, MD Internal Medicine [facility] [address] [place], [place] [address] Cell Phone (Monday-Friday 8 am - 5 pm): [phone] On Call: [phone] and follow prompts after 5 pm and on weekends Office Phone: [phone] Office Fax: [phone]

## 2016-11-21 ENCOUNTER — Emergency Department (HOSPITAL_COMMUNITY)
Admission: EM | Admit: 2016-11-21 | Discharge: 2016-11-21 | Disposition: A | Discharge status: Home / Self Care | Payer: Medicare Other | Attending: Emergency Medicine | Admitting: Emergency Medicine

## 2016-11-21 ENCOUNTER — Encounter (HOSPITAL_COMMUNITY): Payer: Self-pay | Admitting: Emergency Medicine

## 2016-11-21 ENCOUNTER — Emergency Department (HOSPITAL_COMMUNITY): Payer: Medicare Other

## 2016-11-21 DIAGNOSIS — R4182 Altered mental status, unspecified: Secondary | ICD-10-CM | POA: Diagnosis present

## 2016-11-21 DIAGNOSIS — R404 Transient alteration of awareness: Secondary | ICD-10-CM | POA: Insufficient documentation

## 2016-11-21 DIAGNOSIS — N189 Chronic kidney disease, unspecified: Secondary | ICD-10-CM | POA: Insufficient documentation

## 2016-11-21 DIAGNOSIS — R69 Illness, unspecified: Secondary | ICD-10-CM

## 2016-11-21 DIAGNOSIS — Z8546 Personal history of malignant neoplasm of prostate: Secondary | ICD-10-CM | POA: Diagnosis not present

## 2016-11-21 DIAGNOSIS — Z794 Long term (current) use of insulin: Secondary | ICD-10-CM | POA: Diagnosis not present

## 2016-11-21 DIAGNOSIS — Z79899 Other long term (current) drug therapy: Secondary | ICD-10-CM | POA: Diagnosis not present

## 2016-11-21 LAB — URINALYSIS, ROUTINE W REFLEX MICROSCOPIC
Bilirubin Urine: NEGATIVE
GLUCOSE, UA: NEGATIVE mg/dL
HGB URINE DIPSTICK: NEGATIVE
KETONES UR: NEGATIVE mg/dL
Leukocytes, UA: NEGATIVE
Nitrite: NEGATIVE
PROTEIN: NEGATIVE mg/dL
Specific Gravity, Urine: 1.013 (ref 1.005–1.030)
pH: 6 (ref 5.0–8.0)

## 2016-11-21 LAB — COMPREHENSIVE METABOLIC PANEL
ALK PHOS: 73 U/L (ref 38–126)
ALT: 31 U/L (ref 17–63)
ANION GAP: 8 (ref 5–15)
AST: 27 U/L (ref 15–41)
Albumin: 3.7 g/dL (ref 3.5–5.0)
BUN: 11 mg/dL (ref 6–20)
CALCIUM: 8.8 mg/dL — AB (ref 8.9–10.3)
CO2: 24 mmol/L (ref 22–32)
CREATININE: 1.29 mg/dL — AB (ref 0.61–1.24)
Chloride: 102 mmol/L (ref 101–111)
GFR, EST AFRICAN AMERICAN: 59 mL/min — AB (ref >60)
GFR, EST NON AFRICAN AMERICAN: 51 mL/min — AB (ref >60)
Glucose, Bld: 141 mg/dL — ABNORMAL HIGH (ref 65–99)
Potassium: 4.6 mmol/L (ref 3.5–5.1)
Sodium: 134 mmol/L — ABNORMAL LOW (ref 135–145)
TOTAL PROTEIN: 7.5 g/dL (ref 6.5–8.1)
Total Bilirubin: 0.8 mg/dL (ref 0.3–1.2)

## 2016-11-21 LAB — CBC WITH DIFFERENTIAL/PLATELET
BASOS ABS: 0 10*3/uL (ref 0.0–0.1)
BASOS PCT: 0 %
EOS ABS: 0 10*3/uL (ref 0.0–0.7)
Eosinophils Relative: 0 %
HCT: 40.6 % (ref 39.0–52.0)
Hemoglobin: 13.3 g/dL (ref 13.0–17.0)
Lymphocytes Relative: 11 %
Lymphs Abs: 1.2 10*3/uL (ref 0.7–4.0)
MCH: 30.1 pg (ref 26.0–34.0)
MCHC: 32.8 g/dL (ref 30.0–36.0)
MCV: 91.9 fL (ref 78.0–100.0)
MONO ABS: 0.4 10*3/uL (ref 0.1–1.0)
Monocytes Relative: 4 %
Neutro Abs: 9.2 10*3/uL — ABNORMAL HIGH (ref 1.7–7.7)
Neutrophils Relative %: 85 %
Platelets: 318 10*3/uL (ref 150–400)
RBC: 4.42 MIL/uL (ref 4.22–5.81)
RDW: 14 % (ref 11.5–15.5)
WBC: 10.8 10*3/uL — AB (ref 4.0–10.5)

## 2016-11-21 LAB — PROTIME-INR
INR: 1.03
PROTHROMBIN TIME: 13.5 s (ref 11.4–15.2)

## 2016-11-21 LAB — I-STAT CG4 LACTIC ACID, ED: LACTIC ACID, VENOUS: 2.13 mmol/L — AB (ref 0.5–1.9)

## 2016-11-21 MED ORDER — SODIUM CHLORIDE 0.9 % IV BOLUS (SEPSIS)
500.0000 mL | Freq: Once | INTRAVENOUS | Status: AC
  Administered 2016-11-21: 500 mL via INTRAVENOUS

## 2016-11-21 NOTE — ED Notes (Signed)
MD notified code sepsis

## 2016-11-21 NOTE — ED Triage Notes (Signed)
Pt from [facility]. PT has MOST form for comfort measures only but wife requests that he have full treatment except intubation and CPR. Hypotensive and altered on scene... Unresponsive on scene and no cap refill. 200cc NS given per EMS and nonrebreather applied. Is more alert. Lung sounds decreased.

## 2016-11-21 NOTE — ED Notes (Signed)
Unable to pull blood off IV and unsuccessful attempt at additional IV access.  Philippa Chester, phlebotomy is coming to draw blood.

## 2016-11-21 NOTE — ED Provider Notes (Signed)
[facility] DEPT Provider Note   CSN: [id number] Arrival date & time: 11/21/16  1420     History   Chief Complaint Chief Complaint  Patient presents with  . Code Sepsis    HPI [facility] is a 81 y.o. male.  HPI The patient's wife reports she was feeding him and giving a little break. She reports she then looked over it and he slumped to the side. He became somewhat diaphoretic and less responsive. This then resolved and when EMS arrived. Patient's wife reports that at baseline and patient is somewhat interactive but mostly will reply "no" everything. Past Medical History:  Diagnosis Date  . CKD (chronic kidney disease)   . Dementia   . 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 Diabetes mellitus without complication ([facility])   . DVT (deep venous thrombosis) ([facility]) 09/16/2013  . Glaucoma   . Latent tuberculosis 09/16/2013  . Osteoarthritis 09/16/2013  . Prostate cancer ([facility]) 2006   s/p seed radiation  . Pure hypercholesterolemia 10/18/2013    Patient Active Problem List   Diagnosis Date Noted  . History of DVT (deep vein thrombosis) 04/15/2016  . Protein-calorie malnutrition, severe 03/01/2016  . Dehydration 02/17/2016  . Major depression, chronic 12/08/2015  . Chronic constipation 11/09/2015  . IDDM (insulin dependent diabetes mellitus) ([facility]) 10/11/2015  . Microalbuminuria due to type 2 diabetes mellitus ([facility]) 09/07/2015  . Type 2 diabetes mellitus with diabetic peripheral angiopathy without gangrene, without long-term current use of insulin ([facility]) 07/14/2015  . BPH without obstruction/lower urinary tract symptoms 10/24/2014  . Hyperlipidemia LDL goal <100 10/24/2014  . Type 2 diabetes with peripheral circulatory disorder, controlled ([facility]) 10/24/2014  . Glaucoma 11/02/2013  . Alzheimer's dementia without behavioral disturbance 09/16/2013  . Osteoarthritis 09/16/2013    Past Surgical History:  Procedure Laterality Date  . COLONOSCOPY WITH PROPOFOL N/A 01/24/2014   Procedure: COLONOSCOPY WITH PROPOFOL;  Surgeon: Garlan Fair, MD;  [facility];  Service: Endoscopy;  Laterality: N/A;  . ivc filter placement         Home Medications    Prior to Admission medications   Medication Sig Start Date End Date Taking? Authorizing Provider  acetaminophen (TYLENOL) 500 MG tablet Take 500 mg by mouth every 4 (four) hours as needed for mild pain or fever.    Yes [provider]  atorvastatin (LIPITOR) 20 MG tablet Take 20 mg by mouth daily.    Yes [provider]  bisacodyl (DULCOLAX) 10 MG suppository Place 10 mg rectally See admin instructions. EVERY 3RD DAY IF NO BM   Yes [provider]  Cholecalciferol 1000 units tablet Take 1,000 Units by mouth daily.   Yes [provider]  citalopram (CELEXA) 10 MG tablet Take 10 mg by mouth daily.   Yes [provider]  docusate sodium (COLACE) 100 MG capsule Take 100 mg by mouth at bedtime.   Yes [provider]  donepezil (ARICEPT) 10 MG tablet Take 10 mg by mouth at bedtime.   Yes [provider]  insulin glargine (LANTUS) 100 UNIT/ML injection Inject 18 Units into the skin at bedtime.    Yes [provider]  insulin lispro (HUMALOG) 100 UNIT/ML injection Inject 5-10 Units into the skin 4 (four) times daily -  before meals and at bedtime. PER SLIDING SCALE/BGL: 0-60 = HYPOGLYCEMIC  PROTOCOL; 61-149 = 0 UNITS; 150-250 = 5 UNITS; 251-300 = 8 UNITS; 301-350 = 10 UNITS; >350 = CALL MD   Yes [provider]  latanoprost (  XALATAN) 0.005 % ophthalmic solution Place 1 drop into both eyes at bedtime. Wait 3-5 minutes between 2 eye meds   Yes [provider]  lisinopril (PRINIVIL,ZESTRIL) 2.5 MG tablet Take 2.5 mg by mouth daily. HOLD IF SYSTOLIC B/P READING IS <401   Yes [provider]  Melatonin 3 MG TABS Take 1 tablet by mouth at bedtime.   Yes [provider]  memantine (NAMENDA XR) 28 MG CP24 24 hr capsule  Take 28 mg by mouth daily.    Yes [provider]  sennosides-docusate sodium (SENOKOT-S) 8.6-50 MG tablet Take 2 tablets by mouth at bedtime. HOLD FOR LOOSE STOOLS   Yes [provider]  tamsulosin (FLOMAX) 0.4 MG CAPS capsule Take 0.4 mg by mouth daily.    Yes [provider]    Family History History reviewed. No pertinent family history.  Social History Social History  Substance Use Topics  . Smoking status: Never Smoker  . Smokeless tobacco: Never Used  . Alcohol use No     Allergies   Patient has no known allergies.   Review of Systems Review of Systems  Cannot obtain review systems level by caveat dementia Physical Exam Updated Vital Signs BP (!) 113/91   Pulse 69   Resp 17   Ht 6' (1.829 m)   Wt 90.7 kg (200 lb)   SpO2 98%   BMI 27.12 kg/m   Physical Exam  Constitutional:  Patient is awake but does not respond verbally. His eyes are open. Respiratory distress. He appears chronically debilitated.  HENT:  Head: Normocephalic and atraumatic.  Mouth/Throat: Oropharynx is clear and moist.  Eyes: EOM are normal.  Cardiovascular: Normal rate, regular rhythm, normal heart sounds and intact distal pulses.   Distant heart sounds.  Pulmonary/Chest: Effort normal and breath sounds normal.  Patient does not make effort for deep inspiration. No gross wheezes rhonchi rale  Abdominal: Soft. He exhibits no distension. There is no tenderness.  Musculoskeletal:  Patient does not follow commands for strength testing. No significant peripheral edema, no erythema or cellulitis of lower extremities.  Neurological:  Patient is awake but does not make verbal responses. This is baseline. He does not follow commands to perform grip strength or move extremities.  Skin: Skin is warm and dry.  Psychiatric:  Patient is calm.     ED Treatments / Results  Labs (all labs ordered are listed, but only abnormal results are displayed) Labs Reviewed    COMPREHENSIVE METABOLIC PANEL - Abnormal; Notable for the following:       Result Value   Sodium 134 (*)    Glucose, Bld 141 (*)    Creatinine, Ser 1.29 (*)    Calcium 8.8 (*)    GFR calc non Af Amer 51 (*)    GFR calc Af Amer 59 (*)    All other components within normal limits  CBC WITH DIFFERENTIAL/PLATELET - Abnormal; Notable for the following:    WBC 10.8 (*)    Neutro Abs 9.2 (*)    All other components within normal limits  I-STAT CG4 LACTIC ACID, ED - Abnormal; Notable for the following:    Lactic Acid, Venous 2.13 (*)    All other components within normal limits  CULTURE, BLOOD (ROUTINE X 2)  CULTURE, BLOOD (ROUTINE X 2)  PROTIME-INR  URINALYSIS, ROUTINE W REFLEX MICROSCOPIC  I-STAT CG4 LACTIC ACID, ED    EKG  EKG Interpretation None       Radiology Dg Chest Port 1  View  Result Date: 11/21/2016 CLINICAL DATA:  Acute unresponsiveness with vomiting, diaphoresis, and loss of bowels. Hx Stage 3 Renal Disease. Dementia, and Diabetes. EXAM: PORTABLE CHEST 1 VIEW COMPARISON:  10/29/2015 FINDINGS: Patient is kyphotic, the chin obscuring the upper portion of the chest. Heart size is normal. Aorta is tortuous. There are no focal consolidations or pleural effusions. No pulmonary edema. IMPRESSION: No active disease. Electronically Signed   By: Nolon Nations M.D.   On: 11/21/2016 15:42    Procedures Procedures (including critical care time)  Medications Ordered in ED Medications  sodium chloride 0.9 % bolus 500 mL (500 mLs Intravenous New Bag/Given 11/21/16 1536)     Initial Impression / Assessment and Plan / ED Course  I have reviewed the triage vital signs and the nursing notes.  Pertinent labs & imaging results that were available during my care of the patient were reviewed by me and considered in my medical decision making (see chart for details).      Final Clinical Impressions(s) / ED Diagnoses   Final diagnoses:  Transient alteration of awareness  Severe  comorbid illness   Wife describes a transient event of the patient becoming slumped over. He now is at baseline. Vital signs have been stable in the emergency department. There is no focal suggestion of infection. Chest x-ray urinalysis are negative. At this time, plan will be for continued observation at his skilled nursing facility. He should have close follow-up with PCP and return of specific symptoms develop. New Prescriptions New Prescriptions   No medications on file     Charlesetta Shanks, MD 11/21/16 1744

## 2016-11-21 NOTE — ED Notes (Signed)
In and out cath complete

## 2016-11-26 LAB — CULTURE, BLOOD (ROUTINE X 2)
Culture: NO GROWTH
Culture: NO GROWTH
SPECIAL REQUESTS: ADEQUATE
Special Requests: ADEQUATE

## 2017-03-11 ENCOUNTER — Encounter: Payer: Self-pay | Admitting: Neurology

## 2017-03-11 ENCOUNTER — Ambulatory Visit (INDEPENDENT_AMBULATORY_CARE_PROVIDER_SITE_OTHER): Payer: Medicare Other | Admitting: Neurology

## 2017-03-11 VITALS — BP 106/69 | HR 78

## 2017-03-11 DIAGNOSIS — G308 Other Alzheimer's disease: Secondary | ICD-10-CM

## 2017-03-11 DIAGNOSIS — G40909 Epilepsy, unspecified, not intractable, without status epilepticus: Secondary | ICD-10-CM | POA: Insufficient documentation

## 2017-03-11 DIAGNOSIS — F0280 Dementia in other diseases classified elsewhere without behavioral disturbance: Secondary | ICD-10-CM | POA: Diagnosis not present

## 2017-03-11 MED ORDER — DIVALPROEX SODIUM 125 MG PO CSDR: 375.0000 mg | DELAYED_RELEASE_CAPSULE | Freq: Two times a day (BID) | ORAL | 11 refills | Status: DC

## 2017-03-11 NOTE — Progress Notes (Signed)
PATIENT: Derrick Rubio DOB: 1936-05-20  Chief Complaint  Patient presents with  . Seizures    He is her with his wife, Derrick Rubio. He is here for further evaluation of three episodes with loss of consciousness, excessive sweating, hand tremors and incontinence.  [place] reports stable vitals, blood sugar in 130's, negative CT head, negative cardiac workup and no infections present.  All three episodes occurred in June 2018 and were witnessed by his wife.  His first event was also witnessed by Derrick Piles, NP.  Marland Kitchen PCP    Derrick Piles, NP now takes care of his primary needs     HISTORICAL  Derrick Rubio is a 81 year old male, nursing home resident at [facility], accompanied by his wife  Derrick Rubio, seen in refer by  his nurse practitioner Derrick Rubio for evaluation of seizure, initial evaluation was on March 11 2017.  I reviewed and summarized the referring note, he has history of type 2 diabetes, hyperlipidemia, hypertension, glaucoma, chronic kidney disease, history of DVT, osteoarthritis, history of prostate cancer, status post radiation, he has advanced dementia, has been a resident of nursing home since 2015.  He had PhD degree, used to work at [employer], he was newly diagnosed with diabetes in 2007, presented with coma, glucose level was more than 1000 per his wife, since that incident, he was noted to have gradual onset memory loss, he was able to go back to work for 3 years, has difficulty handling his job, eventually retired in 2010,  He has strong family history of Alzheimer's disease, his mother and 2 of his elderly siblings also suffered Alzheimer's disease.  He is now needing total care, wheelchair bound, know his wife's name, but could not carry on a conversation, needing dressing, bathing, feeding, bowel and bladder incontinence.  He began to have seizure in June 2018, 3 episode that was witnessed by his wife, set onset increased confusion, right arm  jerking, spreading to whole-body tonic-clonic movement followed by post event confusion, lasting for a few minutes,  we personally reviewed CT head in September 2018, generalized atrophy, no acute abnormality, moderate supratentorium small vessel disease,  Laboratory evaluation in June 2018 showed creatinine 1.29, mild elevated glucose 141, CBC showed no significant abnormality   REVIEW OF SYSTEMS: Full 14 system review of systems performed and notable only for memory loss, confusion, seizure, passing out, tremor, depression  ALLERGIES: No Known Allergies  HOME MEDICATIONS: Current Outpatient Prescriptions  Medication Sig Dispense Refill  . acetaminophen (TYLENOL) 500 MG tablet Take 500 mg by mouth every 4 (four) hours as needed for mild pain or fever.     Marland Kitchen atorvastatin (LIPITOR) 20 MG tablet Take 20 mg by mouth daily.     . bisacodyl (DULCOLAX) 10 MG suppository Place 10 mg rectally See admin instructions. EVERY 3RD DAY IF NO BM    . Cholecalciferol 1000 units tablet Take 2,000 Units by mouth daily.     . citalopram (CELEXA) 10 MG tablet Take 10 mg by mouth daily.    Marland Kitchen docusate sodium (COLACE) 100 MG capsule Take 100 mg by mouth at bedtime.    . donepezil (ARICEPT) 10 MG tablet Take 10 mg by mouth at bedtime.    . insulin glargine (LANTUS) 100 UNIT/ML injection Inject 18 Units into the skin at bedtime.     . insulin lispro (HUMALOG) 100 UNIT/ML injection Inject 5-10 Units into the skin 4 (four) times daily -  before meals and at bedtime. PER SLIDING  SCALE/BGL: 0-60 = HYPOGLYCEMIC  PROTOCOL; 61-149 = 0 UNITS; 150-250 = 5 UNITS; 251-300 = 8 UNITS; 301-350 = 10 UNITS; >350 = CALL MD    . latanoprost (XALATAN) 0.005 % ophthalmic solution Place 1 drop into both eyes at bedtime. Wait 3-5 minutes between 2 eye meds    . lisinopril (PRINIVIL,ZESTRIL) 2.5 MG tablet Take 2.5 mg by mouth daily. HOLD IF SYSTOLIC B/P READING IS <735    . Melatonin 3 MG TABS Take 1 tablet by mouth at bedtime.    .  memantine (NAMENDA XR) 28 MG CP24 24 hr capsule Take 28 mg by mouth daily.     . sennosides-docusate sodium (SENOKOT-S) 8.6-50 MG tablet Take 2 tablets by mouth at bedtime. HOLD FOR LOOSE STOOLS    . tamsulosin (FLOMAX) 0.4 MG CAPS capsule Take 0.4 mg by mouth daily.      No current facility-administered medications for this visit.     PAST MEDICAL HISTORY: Past Medical History:  Diagnosis Date  . Alzheimer's disease   . CKD (chronic kidney disease)   . Dementia   . 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 Diabetes mellitus without complication ([place])   . DVT (deep venous thrombosis) ([place]) 09/16/2013  . Glaucoma   . Hyperlipemia   . Latent tuberculosis 09/16/2013  . Osteoarthritis 09/16/2013  . Prostate cancer ([place]) 2006   s/p seed radiation  . Pure hypercholesterolemia 10/18/2013  . Seizure-like activity ([place])     PAST SURGICAL HISTORY: Past Surgical History:  Procedure Laterality Date  . COLONOSCOPY WITH PROPOFOL N/A 01/24/2014   Procedure: COLONOSCOPY WITH PROPOFOL;  Surgeon: Garlan Fair, MD;  Location: [facility];  Service: Endoscopy;  Laterality: N/A;  . ivc filter placement      FAMILY HISTORY: Family History  Problem Relation Age of Onset  . Heart attack Mother   . Heart attack Father     SOCIAL HISTORY:  Social History   Social History  . Marital status: Married    Spouse name: N/A  . Number of children: 2  . Years of education: PhD   Occupational History  . Retired    Social History Main Topics  . Smoking status: Never Smoker  . Smokeless tobacco: Never Used  . Alcohol use No  . Drug use: No  . Sexual activity: Not Currently   Other Topics Concern  . Not on file   Social History Narrative   Lives at [facility] and [place].   Right-handed.   No caffeine use.     PHYSICAL EXAM   Vitals:   03/11/17 1120  BP: 106/69  Pulse: 78    Not recorded      There is no height or weight on file to calculate BMI.  PHYSICAL EXAMNIATION:  Gen:  NAD, conversant, well nourised, obese, well groomed                     Cardiovascular: Regular rate rhythm, no peripheral edema, warm, nontender. Eyes: Conjunctivae clear without exudates or hemorrhage Neck: Supple, no carotid bruits. Pulmonary: Clear to auscultation bilaterally   NEUROLOGICAL EXAM:  MENTAL STATUS: Speech:    Speech is normal; fluent and spontaneous with normal comprehension.  Cognition:     Orientation to time, place and person     Normal recent and remote memory     Normal Attention span and concentration     Normal Language, naming, repeating,spontaneous speech     Fund of knowledge   CRANIAL NERVES:  CN II: Visual fields are full to confrontation. Fundoscopic exam is normal with sharp discs and no vascular changes. Pupils are round equal and briskly reactive to light. CN III, IV, VI: extraocular movement are normal. No ptosis. CN V: Facial sensation is intact to pinprick in all 3 divisions bilaterally. Corneal responses are intact.  CN VII: Face is symmetric with normal eye closure and smile. CN VIII: Hearing is normal to rubbing fingers CN IX, X: Palate elevates symmetrically. Phonation is normal. CN XI: Head turning and shoulder shrug are intact CN XII: Tongue is midline with normal movements and no atrophy.  MOTOR: There is no pronator drift of out-stretched arms. Muscle bulk and tone are normal. Muscle strength is normal.  REFLEXES: Reflexes are 2+ and symmetric at the biceps, triceps, knees, and ankles. Plantar responses are flexor.  SENSORY: Intact to light touch, pinprick, positional sensation and vibratory sensation are intact in fingers and toes.  COORDINATION: Rapid alternating movements and fine finger movements are intact. There is no dysmetria on finger-to-nose and heel-knee-shin.    GAIT/STANCE: Posture is normal. Gait is steady with normal steps, base, arm swing, and turning. Heel and toe walking are normal. Tandem gait is normal.  Romberg  is absent.   DIAGNOSTIC DATA (LABS, IMAGING, TESTING) - I reviewed patient records, labs, notes, testing and imaging myself where available.   ASSESSMENT AND PLAN  Zayvian Mcmurtry is a 81 y.o. male   Advanced dementia Complex partial seizure with secondary generalization, most recent reported episode was in June 2018  Complete evaluation with EEG  Starting Depakote sprinkle 125 mg 3 tablets twice a day    Marcial Pacas, M.D. Ph.D.  [facility] [address] [place], [place] [address] Ph: [phone] Fax: [phone]  CC: Derrick Rubio, [place]

## 2017-03-24 ENCOUNTER — Other Ambulatory Visit: Payer: Medicare Other

## 2017-03-25 ENCOUNTER — Encounter: Payer: Self-pay | Admitting: Neurology

## 2017-04-09 ENCOUNTER — Ambulatory Visit: Payer: Medicare Other

## 2017-04-17 ENCOUNTER — Ambulatory Visit (INDEPENDENT_AMBULATORY_CARE_PROVIDER_SITE_OTHER): Payer: Medicare Other | Admitting: Neurology

## 2017-04-17 DIAGNOSIS — G308 Other Alzheimer's disease: Principal | ICD-10-CM

## 2017-04-17 DIAGNOSIS — G40909 Epilepsy, unspecified, not intractable, without status epilepticus: Secondary | ICD-10-CM

## 2017-04-17 DIAGNOSIS — F0280 Dementia in other diseases classified elsewhere without behavioral disturbance: Secondary | ICD-10-CM

## 2017-04-17 NOTE — Procedures (Signed)
   HISTORY: 81 years old male, nursing home resident, presented with seizure June 2018. TECHNIQUE:  16 channel EEG was performed based on standard 10-16 international system. One channel was dedicated to EKG, which has demonstrates normal sinus rhythm of 72 beats per minutes.  Upon awakening, the posterior background activity was dysrhythmic, 7Hz , reactive to eye opening and closure.  There was no evidence of epileptiform discharge  Photic stimulation was performed, there was no significant changes on background activities.  Hyperventilation was not performed.  No sleep was achieved.  CONCLUSION: This is an abnormal EEG.  There is evidence of diffuse background slowing, consistent with mild to moderate bilateral hemisphere malfunction, common etiology are advanced central nervous system degenerative disorder, versus metabolic toxic etiology.  Marcial Pacas, M.D. Ph.D.  [facility] [place], [place] [address] Phone: [phone] Fax:      [phone]

## 2017-05-01 ENCOUNTER — Telehealth: Payer: Self-pay | Admitting: Neurology

## 2017-05-01 NOTE — Telephone Encounter (Signed)
Please call patient, EEG showed generalized moderate background slowing, consistent with his history of dementia

## 2017-05-01 NOTE — Telephone Encounter (Signed)
Left message requesting a return call.

## 2017-05-01 NOTE — Telephone Encounter (Signed)
Spoke to patient's wife - she is aware of results and verbalized understanding.

## 2017-05-01 NOTE — Telephone Encounter (Signed)
Pt's wife called for EEG results

## 2017-09-08 ENCOUNTER — Ambulatory Visit: Payer: Medicare Other | Admitting: Nurse Practitioner

## 2018-01-04 ENCOUNTER — Other Ambulatory Visit: Payer: Self-pay

## 2018-01-04 ENCOUNTER — Emergency Department (HOSPITAL_COMMUNITY): Payer: Medicare Other

## 2018-01-04 ENCOUNTER — Encounter (HOSPITAL_COMMUNITY): Payer: Self-pay | Admitting: Emergency Medicine

## 2018-01-04 ENCOUNTER — Inpatient Hospital Stay (HOSPITAL_COMMUNITY)
Admission: EM | Admit: 2018-01-04 | Discharge: 2018-01-07 | DRG: 689 | Disposition: A | Discharge status: Skilled Nursing Facility | Payer: Medicare Other | Attending: Internal Medicine | Admitting: Internal Medicine

## 2018-01-04 DIAGNOSIS — N179 Acute kidney failure, unspecified: Secondary | ICD-10-CM | POA: Diagnosis not present

## 2018-01-04 DIAGNOSIS — N183 Chronic kidney disease, stage 3 unspecified: Secondary | ICD-10-CM | POA: Insufficient documentation

## 2018-01-04 DIAGNOSIS — E7800 Pure hypercholesterolemia, unspecified: Secondary | ICD-10-CM | POA: Diagnosis present

## 2018-01-04 DIAGNOSIS — B951 Streptococcus, group B, as the cause of diseases classified elsewhere: Secondary | ICD-10-CM | POA: Diagnosis not present

## 2018-01-04 DIAGNOSIS — R1310 Dysphagia, unspecified: Secondary | ICD-10-CM | POA: Diagnosis not present

## 2018-01-04 DIAGNOSIS — Z794 Long term (current) use of insulin: Secondary | ICD-10-CM | POA: Diagnosis not present

## 2018-01-04 DIAGNOSIS — M1990 Unspecified osteoarthritis, unspecified site: Secondary | ICD-10-CM | POA: Diagnosis not present

## 2018-01-04 DIAGNOSIS — Z8249 Family history of ischemic heart disease and other diseases of the circulatory system: Secondary | ICD-10-CM

## 2018-01-04 DIAGNOSIS — N400 Enlarged prostate without lower urinary tract symptoms: Secondary | ICD-10-CM | POA: Diagnosis present

## 2018-01-04 DIAGNOSIS — H409 Unspecified glaucoma: Secondary | ICD-10-CM | POA: Diagnosis present

## 2018-01-04 DIAGNOSIS — E1122 Type 2 diabetes mellitus with diabetic chronic kidney disease: Secondary | ICD-10-CM | POA: Diagnosis not present

## 2018-01-04 DIAGNOSIS — Z66 Do not resuscitate: Secondary | ICD-10-CM | POA: Diagnosis present

## 2018-01-04 DIAGNOSIS — F329 Major depressive disorder, single episode, unspecified: Secondary | ICD-10-CM | POA: Diagnosis not present

## 2018-01-04 DIAGNOSIS — J690 Pneumonitis due to inhalation of food and vomit: Secondary | ICD-10-CM | POA: Diagnosis not present

## 2018-01-04 DIAGNOSIS — F0280 Dementia in other diseases classified elsewhere without behavioral disturbance: Secondary | ICD-10-CM | POA: Diagnosis present

## 2018-01-04 DIAGNOSIS — N182 Chronic kidney disease, stage 2 (mild): Secondary | ICD-10-CM | POA: Diagnosis present

## 2018-01-04 DIAGNOSIS — Z79899 Other long term (current) drug therapy: Secondary | ICD-10-CM

## 2018-01-04 DIAGNOSIS — R17 Unspecified jaundice: Secondary | ICD-10-CM | POA: Diagnosis not present

## 2018-01-04 DIAGNOSIS — E860 Dehydration: Secondary | ICD-10-CM | POA: Diagnosis present

## 2018-01-04 DIAGNOSIS — Z8546 Personal history of malignant neoplasm of prostate: Secondary | ICD-10-CM | POA: Diagnosis not present

## 2018-01-04 DIAGNOSIS — E1151 Type 2 diabetes mellitus with diabetic peripheral angiopathy without gangrene: Secondary | ICD-10-CM | POA: Diagnosis present

## 2018-01-04 DIAGNOSIS — G309 Alzheimer's disease, unspecified: Secondary | ICD-10-CM | POA: Diagnosis not present

## 2018-01-04 DIAGNOSIS — R627 Adult failure to thrive: Secondary | ICD-10-CM | POA: Diagnosis present

## 2018-01-04 DIAGNOSIS — Z6825 Body mass index (BMI) 25.0-25.9, adult: Secondary | ICD-10-CM

## 2018-01-04 DIAGNOSIS — G308 Other Alzheimer's disease: Secondary | ICD-10-CM | POA: Diagnosis not present

## 2018-01-04 DIAGNOSIS — Z79890 Hormone replacement therapy (postmenopausal): Secondary | ICD-10-CM

## 2018-01-04 DIAGNOSIS — N390 Urinary tract infection, site not specified: Secondary | ICD-10-CM | POA: Diagnosis present

## 2018-01-04 DIAGNOSIS — R509 Fever, unspecified: Secondary | ICD-10-CM

## 2018-01-04 LAB — I-STAT VENOUS BLOOD GAS, ED
ACID-BASE EXCESS: 3 mmol/L — AB (ref 0.0–2.0)
BICARBONATE: 26.7 mmol/L (ref 20.0–28.0)
O2 Saturation: 88 %
TCO2: 28 mmol/L (ref 22–32)
pCO2, Ven: 35.7 mmHg — ABNORMAL LOW (ref 44.0–60.0)
pH, Ven: 7.482 — ABNORMAL HIGH (ref 7.250–7.430)
pO2, Ven: 51 mmHg — ABNORMAL HIGH (ref 32.0–45.0)

## 2018-01-04 LAB — COMPREHENSIVE METABOLIC PANEL
ALBUMIN: 2.9 g/dL — AB (ref 3.5–5.0)
ALT: 43 U/L (ref 0–44)
ANION GAP: 9 (ref 5–15)
AST: 56 U/L — ABNORMAL HIGH (ref 15–41)
Alkaline Phosphatase: 77 U/L (ref 38–126)
BUN: 9 mg/dL (ref 8–23)
CHLORIDE: 100 mmol/L (ref 98–111)
CO2: 25 mmol/L (ref 22–32)
Calcium: 8.2 mg/dL — ABNORMAL LOW (ref 8.9–10.3)
Creatinine, Ser: 1.2 mg/dL (ref 0.61–1.24)
GFR calc non Af Amer: 55 mL/min — ABNORMAL LOW (ref >60)
GLUCOSE: 128 mg/dL — AB (ref 70–99)
Potassium: 6.4 mmol/L (ref 3.5–5.1)
SODIUM: 134 mmol/L — AB (ref 135–145)
Total Bilirubin: 2.4 mg/dL — ABNORMAL HIGH (ref 0.3–1.2)
Total Protein: 6.3 g/dL — ABNORMAL LOW (ref 6.5–8.1)

## 2018-01-04 LAB — URINALYSIS, ROUTINE W REFLEX MICROSCOPIC
BILIRUBIN URINE: NEGATIVE
Glucose, UA: NEGATIVE mg/dL
KETONES UR: NEGATIVE mg/dL
NITRITE: NEGATIVE
PROTEIN: NEGATIVE mg/dL
Specific Gravity, Urine: 1.014 (ref 1.005–1.030)
pH: 6 (ref 5.0–8.0)

## 2018-01-04 LAB — BASIC METABOLIC PANEL
ANION GAP: 8 (ref 5–15)
BUN: 9 mg/dL (ref 8–23)
CALCIUM: 8.8 mg/dL — AB (ref 8.9–10.3)
CO2: 28 mmol/L (ref 22–32)
Chloride: 100 mmol/L (ref 98–111)
Creatinine, Ser: 1.31 mg/dL — ABNORMAL HIGH (ref 0.61–1.24)
GFR calc Af Amer: 58 mL/min — ABNORMAL LOW (ref >60)
GFR calc non Af Amer: 50 mL/min — ABNORMAL LOW (ref >60)
GLUCOSE: 126 mg/dL — AB (ref 70–99)
Potassium: 3.8 mmol/L (ref 3.5–5.1)
Sodium: 136 mmol/L (ref 135–145)

## 2018-01-04 LAB — CBC WITH DIFFERENTIAL/PLATELET
Abs Immature Granulocytes: 0.1 10*3/uL (ref 0.0–0.1)
BASOS PCT: 0 %
Basophils Absolute: 0 10*3/uL (ref 0.0–0.1)
Eosinophils Absolute: 0.3 10*3/uL (ref 0.0–0.7)
Eosinophils Relative: 2 %
HEMATOCRIT: 37.6 % — AB (ref 39.0–52.0)
Hemoglobin: 12.4 g/dL — ABNORMAL LOW (ref 13.0–17.0)
IMMATURE GRANULOCYTES: 1 %
Lymphocytes Relative: 12 %
Lymphs Abs: 1.4 10*3/uL (ref 0.7–4.0)
MCH: 29.7 pg (ref 26.0–34.0)
MCHC: 33 g/dL (ref 30.0–36.0)
MCV: 90.2 fL (ref 78.0–100.0)
MONOS PCT: 8 %
Monocytes Absolute: 0.9 10*3/uL (ref 0.1–1.0)
NEUTROS PCT: 77 %
Neutro Abs: 9.3 10*3/uL — ABNORMAL HIGH (ref 1.7–7.7)
PLATELETS: 292 10*3/uL (ref 150–400)
RBC: 4.17 MIL/uL — ABNORMAL LOW (ref 4.22–5.81)
RDW: 13.9 % (ref 11.5–15.5)
WBC: 12 10*3/uL — AB (ref 4.0–10.5)

## 2018-01-04 LAB — CBG MONITORING, ED: GLUCOSE-CAPILLARY: 122 mg/dL — AB (ref 70–99)

## 2018-01-04 MED ORDER — LISINOPRIL 5 MG PO TABS
2.5000 mg | ORAL_TABLET | Freq: Every day | ORAL | Status: DC
  Administered 2018-01-05 – 2018-01-06 (×2): 2.5 mg via ORAL
  Filled 2018-01-04 (×2): qty 1

## 2018-01-04 MED ORDER — SODIUM CHLORIDE 0.9 % IV BOLUS
1000.0000 mL | Freq: Once | INTRAVENOUS | Status: AC
Start: 2018-01-04 — End: 2018-01-04
  Administered 2018-01-04: 1000 mL via INTRAVENOUS

## 2018-01-04 MED ORDER — HYDROCODONE-ACETAMINOPHEN 5-325 MG PO TABS: 1.0000 | ORAL_TABLET | Freq: Three times a day (TID) | ORAL | Status: DC | PRN

## 2018-01-04 MED ORDER — TAMSULOSIN HCL 0.4 MG PO CAPS
0.4000 mg | ORAL_CAPSULE | Freq: Every day | ORAL | Status: DC
  Administered 2018-01-05 – 2018-01-06 (×2): 0.4 mg via ORAL
  Filled 2018-01-04 (×2): qty 1

## 2018-01-04 MED ORDER — MEMANTINE HCL ER 28 MG PO CP24
28.0000 mg | ORAL_CAPSULE | Freq: Every day | ORAL | Status: DC
  Administered 2018-01-05 – 2018-01-06 (×2): 28 mg via ORAL
  Filled 2018-01-04 (×3): qty 1

## 2018-01-04 MED ORDER — CITALOPRAM HYDROBROMIDE 20 MG PO TABS
10.0000 mg | ORAL_TABLET | Freq: Every day | ORAL | Status: DC
  Administered 2018-01-05 – 2018-01-07 (×3): 10 mg via ORAL
  Filled 2018-01-04 (×3): qty 1

## 2018-01-04 MED ORDER — HEPARIN SODIUM (PORCINE) 5000 UNIT/ML IJ SOLN
5000.0000 [IU] | Freq: Three times a day (TID) | INTRAMUSCULAR | Status: DC
  Administered 2018-01-05 – 2018-01-07 (×7): 5000 [IU] via SUBCUTANEOUS
  Filled 2018-01-04 (×7): qty 1

## 2018-01-04 MED ORDER — ACETAMINOPHEN 325 MG PO TABS: 650.0000 mg | ORAL_TABLET | Freq: Four times a day (QID) | ORAL | Status: DC | PRN

## 2018-01-04 MED ORDER — ONDANSETRON HCL 4 MG PO TABS: 4.0000 mg | ORAL_TABLET | Freq: Four times a day (QID) | ORAL | Status: DC | PRN

## 2018-01-04 MED ORDER — SODIUM CHLORIDE 0.9 % IV SOLN
1.0000 g | Freq: Once | INTRAVENOUS | Status: AC
  Administered 2018-01-04: 1 g via INTRAVENOUS
  Filled 2018-01-04: qty 10

## 2018-01-04 MED ORDER — INSULIN ASPART 100 UNIT/ML ~~LOC~~ SOLN
0.0000 [IU] | SUBCUTANEOUS | Status: DC
Start: 2018-01-05 — End: 2018-01-07
  Administered 2018-01-05 (×2): 1 [IU] via SUBCUTANEOUS
  Administered 2018-01-05: 2 [IU] via SUBCUTANEOUS
  Administered 2018-01-06: 3 [IU] via SUBCUTANEOUS
  Administered 2018-01-06: 1 [IU] via SUBCUTANEOUS
  Administered 2018-01-06: 3 [IU] via SUBCUTANEOUS
  Administered 2018-01-06 – 2018-01-07 (×6): 1 [IU] via SUBCUTANEOUS

## 2018-01-04 MED ORDER — DONEPEZIL HCL 10 MG PO TABS
10.0000 mg | ORAL_TABLET | Freq: Every day | ORAL | Status: DC
  Administered 2018-01-05 – 2018-01-06 (×2): 10 mg via ORAL
  Filled 2018-01-04 (×2): qty 1

## 2018-01-04 MED ORDER — ATORVASTATIN CALCIUM 20 MG PO TABS
20.0000 mg | ORAL_TABLET | Freq: Every day | ORAL | Status: DC
  Administered 2018-01-05 – 2018-01-06 (×2): 20 mg via ORAL
  Filled 2018-01-04 (×2): qty 1

## 2018-01-04 MED ORDER — SENNOSIDES-DOCUSATE SODIUM 8.6-50 MG PO TABS
2.0000 | ORAL_TABLET | Freq: Every day | ORAL | Status: DC
  Administered 2018-01-05 – 2018-01-06 (×2): 2 via ORAL
  Filled 2018-01-04 (×2): qty 2

## 2018-01-04 MED ORDER — ACETAMINOPHEN 650 MG RE SUPP: 650.0000 mg | Freq: Four times a day (QID) | RECTAL | Status: DC | PRN

## 2018-01-04 MED ORDER — DOCUSATE SODIUM 100 MG PO CAPS
100.0000 mg | ORAL_CAPSULE | Freq: Every day | ORAL | Status: DC
  Administered 2018-01-05 – 2018-01-06 (×2): 100 mg via ORAL
  Filled 2018-01-04 (×2): qty 1

## 2018-01-04 MED ORDER — SODIUM CHLORIDE 0.9 % IV SOLN
1.0000 g | INTRAVENOUS | Status: DC
  Administered 2018-01-05 – 2018-01-06 (×2): 1 g via INTRAVENOUS
  Filled 2018-01-04 (×2): qty 10

## 2018-01-04 MED ORDER — ONDANSETRON HCL 4 MG/2ML IJ SOLN: 4.0000 mg | Freq: Four times a day (QID) | INTRAMUSCULAR | Status: DC | PRN

## 2018-01-04 MED ORDER — LATANOPROST 0.005 % OP SOLN
1.0000 [drp] | Freq: Every day | OPHTHALMIC | Status: DC
  Administered 2018-01-05 – 2018-01-06 (×3): 1 [drp] via OPHTHALMIC
  Filled 2018-01-04: qty 2.5

## 2018-01-04 MED ORDER — SODIUM CHLORIDE 0.9 % IV SOLN
INTRAVENOUS | Status: DC
  Administered 2018-01-05: 02:00:00 via INTRAVENOUS

## 2018-01-04 NOTE — ED Notes (Signed)
ED Provider at bedside. 

## 2018-01-04 NOTE — H&P (Signed)
History and Physical    Derrick Rubio ACZ:[id number] DOB: [birth year] DOA: 01/04/2018  PCP: Crist Infante, FNP   Patient coming from: SNF  Chief Complaint: Lethargy, difficult to rouse  HPI: Derrick Rubio is a 82 y.o. male with medical history significant for Alzheimer dementia, chronic kidney disease stage II-III, depression, and insulin-dependent diabetes mellitus, now presenting to the emergency department from his SNF for evaluation of the lethargy.  Patient is accompanied by his wife who assists with the history.  He has reportedly been more somnolent for more than a week now at the SNF and they had difficulty getting him to respond this evening, prompting transport to the ED for further evaluation.  He has not been complaining of anything per report of his spouse and has not appeared to have any dyspnea or cough.  There has not been any shaking or posturing.  No vomiting or diarrhea reported.  No recent fall or trauma.  He does not use alcohol or illicit substances.  No fevers have been documented.  There was some concern that the patient may have been aspirating at the SNF, but again no dyspnea or cough.  ED Course: Upon arrival to the ED, patient is found to be afebrile, saturating well on room air, and with vitals otherwise stable.  Chest x-ray features stable low volumes and mild bibasilar atelectasis.  Noncontrast head CT is negative for acute intracranial abnormality.  Chemistry panel is notable for a stable CKD and new elevation in total bilirubin to 2.4.  CBC features a leukocytosis to 12,000.  Urinalysis suggest possible infection.  Patient was given a liter of normal saline and 1 g of IV Rocephin in the ED.  Urine will be sent for culture.  Patient has begun to wake up, is making eye contact and answering with "yes" and "no."  He will be observed in the hospital for ongoing evaluation and management of lethargy, likely secondary to UTI.  Review of Systems:  All other systems  reviewed and apart from HPI, are negative.  Past Medical History:  Diagnosis Date  . Alzheimer's disease   . CKD (chronic kidney disease)   . Dementia   . 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 Diabetes mellitus without complication ([place])   . DVT (deep venous thrombosis) ([place]) 09/16/2013  . Glaucoma   . Hyperlipemia   . Latent tuberculosis 09/16/2013  . Osteoarthritis 09/16/2013  . Prostate cancer ([place]) 2006   s/p seed radiation  . Pure hypercholesterolemia 10/18/2013  . Seizure-like activity [facility])     Past Surgical History:  Procedure Laterality Date  . COLONOSCOPY WITH PROPOFOL N/A 01/24/2014   Procedure: COLONOSCOPY WITH PROPOFOL;  Surgeon: Garlan Fair, MD;  Location: [facility];  Service: Endoscopy;  Laterality: N/A;  . ivc filter placement       reports that he has never smoked. He has never used smokeless tobacco. He reports that he does not drink alcohol or use drugs.  No Known Allergies  Family History  Problem Relation Age of Onset  . Heart attack Mother   . Heart attack Father      Prior to Admission medications   Medication Sig Start Date End Date Taking? Authorizing Provider  acetaminophen (TYLENOL) 500 MG tablet Take 500 mg by mouth every 4 (four) hours as needed for mild pain or fever.    Yes [provider]  atorvastatin (LIPITOR) 20 MG tablet Take 20 mg by mouth daily at 6 PM.    Yes [provider]  chlorproMAZINE (THORAZINE) 25 MG tablet Take 25 mg by mouth daily.   Yes [provider]  Cholecalciferol (VITAMIN D-3) 5000 units TABS Take 50,000 Units by mouth every 30 (thirty) days.   Yes [provider]  citalopram (CELEXA) 10 MG tablet Take 10 mg by mouth daily.   Yes [provider]  docusate sodium (COLACE) 100 MG capsule Take 100 mg by mouth at bedtime.   Yes [provider]  donepezil (ARICEPT) 10 MG tablet Take 10 mg by mouth at bedtime.   Yes [provider]  HYDROcodone-acetaminophen  (NORCO/VICODIN) 5-325 MG tablet Take 1 tablet by mouth every 8 (eight) hours as needed for moderate pain.   Yes [provider]  insulin glargine (LANTUS) 100 UNIT/ML injection Inject 18 Units into the skin at bedtime.    Yes [provider]  insulin lispro (HUMALOG) 100 UNIT/ML injection Inject 0-9 Units into the skin 2 (two) times daily. Per sliding scale: Blood sugar 0-200=0U;  201-250=5U; 251-300=7U; 301-350=9U; >350 call MD.   Yes [provider]  latanoprost (XALATAN) 0.005 % ophthalmic solution Place 1 drop into both eyes at bedtime.    Yes [provider]  lisinopril (PRINIVIL,ZESTRIL) 2.5 MG tablet Take 2.5 mg by mouth daily.    Yes [provider]  Melatonin 3 MG TABS Take 3 mg by mouth at bedtime.    Yes [provider]  memantine (NAMENDA XR) 28 MG CP24 24 hr capsule Take 28 mg by mouth daily at 6 PM.    Yes [provider]  promethazine 25 mg in sodium chloride 0.9 % 1,000 mL Inject 12.5 mg into the vein every 6 (six) hours as needed (nausea).   Yes [provider]  sennosides-docusate sodium (SENOKOT-S) 8.6-50 MG tablet Take 2 tablets by mouth at bedtime. HOLD FOR LOOSE STOOLS   Yes [provider]  tamsulosin (FLOMAX) 0.4 MG CAPS capsule Take 0.4 mg by mouth daily at 6 PM.    Yes [provider]    Physical Exam: Vitals:   01/04/18 2245 01/04/18 2300 01/04/18 2315 01/04/18 2330  BP: 126/65 119/68 116/68 121/74  Pulse: 72 77 77 74  Resp: 12 15 18 15   Temp:      TempSrc:      SpO2: 100% 100% 100% 98%      Constitutional: NAD, calm  Eyes: PERTLA, lids and conjunctivae normal ENMT: Mucous membranes are moist. Posterior pharynx clear of any exudate or lesions.   Neck: normal, supple, no masses, no thyromegaly Respiratory: clear to auscultation bilaterally, no wheezing, no crackles. Normal respiratory effort.  Cardiovascular: S1 & S2 heard, regular rate and rhythm. No extremity edema.     Abdomen: No distension, no tenderness, soft. Bowel sounds normal.  Musculoskeletal: no clubbing / cyanosis. No joint deformity upper and lower extremities.  Skin: no significant rashes, lesions, ulcers. Warm, dry, well-perfused. Neurologic: No facial asymmetry. Patellar DTRs normal. Sleeping, easily roused but remains lethargic.      Labs on Admission: I have personally reviewed following labs and imaging studies  CBC: Recent Labs  Lab 01/04/18 1917  WBC 12.0*  NEUTROABS 9.3*  HGB 12.4*  HCT 37.6*  MCV 90.2  PLT 470   Basic Metabolic Panel: Recent Labs  Lab 01/04/18 1745 01/04/18 1917  NA 134* 136  K 6.4* 3.8  CL 100 100  CO2 25 28  GLUCOSE 128* 126*  BUN 9 9  CREATININE 1.20 1.31*  CALCIUM 8.2* 8.8*   GFR:  CrCl cannot be calculated (Unknown ideal weight.). Liver Function Tests: Recent Labs  Lab 01/04/18 1745  AST 56*  ALT 43  ALKPHOS 77  BILITOT 2.4*  PROT 6.3*  ALBUMIN 2.9*   No results for input(s): LIPASE, AMYLASE in the last 168 hours. No results for input(s): AMMONIA in the last 168 hours. Coagulation Profile: No results for input(s): INR, PROTIME in the last 168 hours. Cardiac Enzymes: No results for input(s): CKTOTAL, CKMB, CKMBINDEX, TROPONINI in the last 168 hours. BNP (last 3 results) No results for input(s): PROBNP in the last 8760 hours. HbA1C: No results for input(s): HGBA1C in the last 72 hours. CBG: Recent Labs  Lab 01/04/18 1803  GLUCAP 122*   Lipid Profile: No results for input(s): CHOL, HDL, LDLCALC, TRIG, CHOLHDL, LDLDIRECT in the last 72 hours. Thyroid Function Tests: No results for input(s): TSH, T4TOTAL, FREET4, T3FREE, THYROIDAB in the last 72 hours. Anemia Panel: No results for input(s): VITAMINB12, FOLATE, FERRITIN, TIBC, IRON, RETICCTPCT in the last 72 hours. Urine analysis:    Component Value Date/Time   COLORURINE YELLOW 01/04/2018 1725   APPEARANCEUR HAZY (A) 01/04/2018 1725   APPEARANCEUR Clear 05/14/2014 2030    LABSPEC 1.014 01/04/2018 1725   LABSPEC 1.021 05/14/2014 2030   PHURINE 6.0 01/04/2018 [place] 01/04/2018 1725   GLUCOSEU Negative 05/14/2014 2030   HGBUR SMALL (A) 01/04/2018 1725   BILIRUBINUR NEGATIVE 01/04/2018 1725   BILIRUBINUR Negative 05/14/2014 2030   KETONESUR NEGATIVE 01/04/2018 1725   PROTEINUR NEGATIVE 01/04/2018 1725   NITRITE NEGATIVE 01/04/2018 1725   LEUKOCYTESUR LARGE (A) 01/04/2018 1725   LEUKOCYTESUR Negative 05/14/2014 2030   Sepsis Labs: @LABRCNTIP (procalcitonin:4,lacticidven:4) )No results found for this or any previous visit (from the past 240 hour(s)).   Radiological Exams on Admission: Ct Head Wo Contrast  Result Date: 01/04/2018 CLINICAL DATA:  Altered level of consciousness EXAM: CT HEAD WITHOUT CONTRAST TECHNIQUE: Contiguous axial images were obtained from the base of the skull through the vertex without intravenous contrast. COMPARISON:  None. FINDINGS: Brain: No acute intracranial hemorrhage. No focal mass lesion. No CT evidence of acute infarction. No midline shift or mass effect. No hydrocephalus. Basilar cisterns are patent. There are periventricular and subcortical white matter hypodensities. Generalized cortical atrophy. Ventricular dilatation proportional atrophy. Vascular: No hyperdense vessel or unexpected calcification. Skull: Normal. Negative for fracture or focal lesion. Sinuses/Orbits: No acute finding. Other: None. IMPRESSION: 1. No acute intracranial findings. 2. Chronic atrophy and white matter microvascular disease. 3. No change from 02/29/2016 Electronically Signed   By: Suzy Bouchard M.D.   On: 01/04/2018 18:32   Dg Chest Portable 1 View  Result Date: 01/04/2018 CLINICAL DATA:  Coughing, choking, and hiccuping for 1 week. Altered mental status beginning today. EXAM: PORTABLE CHEST 1 VIEW COMPARISON:  11/21/2016 FINDINGS: The heart size and mediastinal contours are within normal limits. Aortic atherosclerosis. Low lung  volumes and bibasilar atelectasis again seen. No evidence of pulmonary consolidation or edema. No evidence of pneumothorax or pleural effusion. The visualized skeletal structures are unremarkable. IMPRESSION: No significant change in low lung volumes and mild bibasilar atelectasis. Electronically Signed   By: Earle Gell M.D.   On: 01/04/2018 17:42    EKG: Not performed.   Assessment/Plan  1. UTI  - Presents from SNF with increased lethargy for more than a week  - ED workup suggests possible UTI  - He was treated with 1 g IV Rocephin in ED and urine will be sent for culture  - Continue  current antibiotic while following culture and clinical course    2. Lethargy  - Presents from SNF with lethargy for more than a week, was reportedly difficult to rouse at the SNF, but now waking up, making eye-contact, and responding with "yes" and "no" after 1 liter NS and Rocephin in ED  - Likely secondary to UTI and/or advancing dementia  - Check ammonia given the elevated bilirubin, continue treatment of UTI as above    3. Dementia  - Wife reports that patient typically answers simple questions, but has been somnolent and not speaking last several days  - Resume Namenda and Aricept when he is more appropriate for a diet    4. CKD stage II-III  - SCr is 1.31 on admission, similar to priors  - Renally-dose medications, continue gentle IVF hydration until PO intake improves    5. Type II DM  - A1c 7.2% in April 2018  - Managed at the SNF with Lantus 18 units qHS and Humalog 0-9 units BID  - He has been somnolent and not eating  - Hold basal insulin initially, check CBG's, and use a SSI only to start    6. Hyperbilirubinemia  - Total bilirubin elevated to 2.4 on admission, previously normal  - There is no RUQ tenderness  - Fractionate bilirubin, repeat LFT's in am    7. ?Dysphagia - There was concern at the SNF that patient could be aspirating  - Keep NPO for now and request SLP eval    DVT  prophylaxis: sq heparin  Code Status: DNR  Family Communication: Wife updated at bedside Consults called: None Admission status: Observation     Vianne Bulls, MD [facility] Pager [phone]  If 7PM-7AM, please contact night-coverage [email or web address] Password [facility]  01/04/2018, 11:53 PM

## 2018-01-04 NOTE — ED Notes (Signed)
Patient transported to CT 

## 2018-01-04 NOTE — ED Provider Notes (Signed)
[place] EMERGENCY DEPARTMENT Provider Note   CSN: [id number] Arrival date & time: 01/04/18  1638  History   Chief Complaint Chief Complaint  Patient presents with  . Altered Mental Status   HPI  Patient is an 82 year old male with history of Alzheimer's dementia, CKD, and DM presenting to the ED from SNF for altered mental status. History limited by dementia with nonverbal status. Wife is present providing history is very involved in his care. She states that he has had progressively declining speech with pickups over the last 8-9 days. She states his baseline is that he shows very slow mentation pole usually respond in 1-2 word phrases, but recently has only been mumbling. He has been allowing her to feed him less but has been tolerating some Pedialyte. Today, he will not open his eyes or speak at all, and he has not been tolerating any PO intake. There has been concern that he has been choking on PO intake recently. No fever, vomiting, diarrhea, or known recent injuries. He is nonambulatory at baseline and requires total ADL support.  Past Medical History:  Diagnosis Date  . Alzheimer's disease   . CKD (chronic kidney disease)   . Dementia   . 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 Diabetes mellitus without complication ([place])   . DVT (deep venous thrombosis) ([place]) 09/16/2013  . Glaucoma   . Hyperlipemia   . Latent tuberculosis 09/16/2013  . Osteoarthritis 09/16/2013  . Prostate cancer ([place]) 2006   s/p seed radiation  . Pure hypercholesterolemia 10/18/2013  . Seizure-like activity [facility])     Patient Active Problem List   Diagnosis Date Noted  . CKD (chronic kidney disease), stage III ([place]) 01/04/2018  . Acute lower UTI 01/04/2018  . UTI (urinary tract infection) 01/04/2018  . History of DVT (deep vein thrombosis) 04/15/2016  . Protein-calorie malnutrition, severe 03/01/2016  . Dehydration 02/17/2016  . Major depression, chronic 12/08/2015  . Chronic  constipation 11/09/2015  . Microalbuminuria due to type 2 diabetes mellitus ([place]) 09/07/2015  . BPH without obstruction/lower urinary tract symptoms 10/24/2014  . Hyperlipidemia LDL goal <100 10/24/2014  . Type 2 diabetes with peripheral circulatory disorder, controlled ([place]) 10/24/2014  . Glaucoma 11/02/2013  . Alzheimer's dementia without behavioral disturbance 09/16/2013  . Osteoarthritis 09/16/2013    Past Surgical History:  Procedure Laterality Date  . COLONOSCOPY WITH PROPOFOL N/A 01/24/2014   Procedure: COLONOSCOPY WITH PROPOFOL;  Surgeon: Garlan Fair, MD;  Location: [facility] END[facility]COPY;  Service: Endoscopy;  Laterality: N/A;  . ivc filter placement          Home Medications    Prior to Admission medications   Medication Sig Start Date End Date Taking? Authorizing Provider  acetaminophen (TYLENOL) 500 MG tablet Take 500 mg by mouth every 4 (four) hours as needed for mild pain or fever.    Yes [provider]  atorvastatin (LIPITOR) 20 MG tablet Take 20 mg by mouth daily at 6 PM.    Yes [provider]  chlorproMAZINE (THORAZINE) 25 MG tablet Take 25 mg by mouth daily.   Yes [provider]  Cholecalciferol (VITAMIN D-3) 5000 units TABS Take 50,000 Units by mouth every 30 (thirty) days.   Yes [provider]  citalopram (CELEXA) 10 MG tablet Take 10 mg by mouth daily.   Yes [provider]  docusate sodium (COLACE) 100 MG capsule Take 100 mg by mouth at bedtime.   Yes [provider]  donepezil (ARICEPT) 10 MG  tablet Take 10 mg by mouth at bedtime.   Yes [provider]  HYDROcodone-acetaminophen (NORCO/VICODIN) 5-325 MG tablet Take 1 tablet by mouth every 8 (eight) hours as needed for moderate pain.   Yes [provider]  insulin glargine (LANTUS) 100 UNIT/ML injection Inject 18 Units into the skin at bedtime.    Yes [provider]  insulin lispro (HUMALOG) 100 UNIT/ML injection Inject 0-9 Units  into the skin 2 (two) times daily. Per sliding scale: Blood sugar 0-200=0U;  201-250=5U; 251-300=7U; 301-350=9U; >350 call MD.   Yes [provider]  latanoprost (XALATAN) 0.005 % ophthalmic solution Place 1 drop into both eyes at bedtime.    Yes [provider]  lisinopril (PRINIVIL,ZESTRIL) 2.5 MG tablet Take 2.5 mg by mouth daily.    Yes [provider]  Melatonin 3 MG TABS Take 3 mg by mouth at bedtime.    Yes [provider]  memantine (NAMENDA XR) 28 MG CP24 24 hr capsule Take 28 mg by mouth daily at 6 PM.    Yes [provider]  promethazine 25 mg in sodium chloride 0.9 % 1,000 mL Inject 12.5 mg into the vein every 6 (six) hours as needed (nausea).   Yes [provider]  sennosides-docusate sodium (SENOKOT-S) 8.6-50 MG tablet Take 2 tablets by mouth at bedtime. HOLD FOR LOOSE STOOLS   Yes [provider]  tamsulosin (FLOMAX) 0.4 MG CAPS capsule Take 0.4 mg by mouth daily at 6 PM.    Yes [provider]    Family History Family History  Problem Relation Age of Onset  . Heart attack Mother   . Heart attack Father     Social History Social History   Tobacco Use  . Smoking status: Never Smoker  . Smokeless tobacco: Never Used  Substance Use Topics  . Alcohol use: No  . Drug use: No     Allergies   Patient has no known allergies.   Review of Systems Review of Systems  Unable to perform ROS: Dementia     Physical Exam Updated Vital Signs BP 121/74   Pulse 74   Temp 97.9 F (36.6 C) (Oral)   Resp 15   SpO2 98%   Physical Exam  Constitutional: He appears listless. No distress.  HENT:  Head: Normocephalic and atraumatic.  Mouth/Throat: Oropharynx is clear and moist.  Eyes:  Patient keeps eyes closed and forcibly squeezes his eyes shut when attempting to open them  Neck: Neck supple. No JVD present.  Cardiovascular: Normal rate, regular rhythm, normal heart sounds and intact distal pulses.    No murmur heard. Pulmonary/Chest: Breath sounds normal. No respiratory distress. He has no wheezes. He has no rales.  Abdominal: Soft. He exhibits no distension and no mass. There is no tenderness. There is no guarding.  Musculoskeletal: He exhibits no edema.  Neurological: He appears listless.  Keeps eyes closed and does not allow me to open them. Turns his head in response to his name but gives no verbal response. Grimaces with painful stimulus such as trapezius squeeze or sternal rub. Normal tone in extremities.  Skin: Skin is warm and dry.  Psychiatric: He has a normal mood and affect. His behavior is normal.  Nursing note and vitals reviewed.    ED Treatments / Results  Labs (all labs ordered are listed, but only abnormal results are displayed) Labs Reviewed  COMPREHENSIVE METABOLIC PANEL - Abnormal; Notable for the following components:      Result Value  Sodium 134 (*)    Potassium 6.4 (*)    Glucose, Bld 128 (*)    Calcium 8.2 (*)    Total Protein 6.3 (*)    Albumin 2.9 (*)    AST 56 (*)    Total Bilirubin 2.4 (*)    GFR calc non Af Amer 55 (*)    All other components within normal limits  URINALYSIS, ROUTINE W REFLEX MICROSCOPIC - Abnormal; Notable for the following components:   APPearance HAZY (*)    Hgb urine dipstick SMALL (*)    Leukocytes, UA LARGE (*)    WBC, UA >50 (*)    Bacteria, UA MANY (*)    All other components within normal limits  CBC WITH DIFFERENTIAL/PLATELET - Abnormal; Notable for the following components:   WBC 12.0 (*)    RBC 4.17 (*)    Hemoglobin 12.4 (*)    HCT 37.6 (*)    Neutro Abs 9.3 (*)    All other components within normal limits  BASIC METABOLIC PANEL - Abnormal; Notable for the following components:   Glucose, Bld 126 (*)    Creatinine, Ser 1.31 (*)    Calcium 8.8 (*)    GFR calc non Af Amer 50 (*)    GFR calc Af Amer 58 (*)    All other components within normal limits  CBG MONITORING, ED - Abnormal; Notable for the  following components:   Glucose-Capillary 122 (*)    All other components within normal limits  I-STAT VENOUS BLOOD GAS, ED - Abnormal; Notable for the following components:   pH, Ven 7.482 (*)    pCO2, Ven 35.7 (*)    pO2, Ven 51.0 (*)    Acid-Base Excess 3.0 (*)    All other components within normal limits  CBG MONITORING, ED - Abnormal; Notable for the following components:   Glucose-Capillary 109 (*)    All other components within normal limits  URINE CULTURE  CBC WITH DIFFERENTIAL/PLATELET  CBC WITH DIFFERENTIAL/PLATELET  COMPREHENSIVE METABOLIC PANEL  AMMONIA    EKG None  Radiology Ct Head Wo Contrast  Result Date: 01/04/2018 CLINICAL DATA:  Altered level of consciousness EXAM: CT HEAD WITHOUT CONTRAST TECHNIQUE: Contiguous axial images were obtained from the base of the skull through the vertex without intravenous contrast. COMPARISON:  None. FINDINGS: Brain: No acute intracranial hemorrhage. No focal mass lesion. No CT evidence of acute infarction. No midline shift or mass effect. No hydrocephalus. Basilar cisterns are patent. There are periventricular and subcortical white matter hypodensities. Generalized cortical atrophy. Ventricular dilatation proportional atrophy. Vascular: No hyperdense vessel or unexpected calcification. Skull: Normal. Negative for fracture or focal lesion. Sinuses/Orbits: No acute finding. Other: None. IMPRESSION: 1. No acute intracranial findings. 2. Chronic atrophy and white matter microvascular disease. 3. No change from 02/29/2016 Electronically Signed   By: Suzy Bouchard M.D.   On: 01/04/2018 18:32   Dg Chest Portable 1 View  Result Date: 01/04/2018 CLINICAL DATA:  Coughing, choking, and hiccuping for 1 week. Altered mental status beginning today. EXAM: PORTABLE CHEST 1 VIEW COMPARISON:  11/21/2016 FINDINGS: The heart size and mediastinal contours are within normal limits. Aortic atherosclerosis. Low lung volumes and bibasilar atelectasis again  seen. No evidence of pulmonary consolidation or edema. No evidence of pneumothorax or pleural effusion. The visualized skeletal structures are unremarkable. IMPRESSION: No significant change in low lung volumes and mild bibasilar atelectasis. Electronically Signed   By: Earle Gell M.D.   On: 01/04/2018 17:42    Procedures Procedures (  including critical care time)  Medications Ordered in ED Medications  atorvastatin (LIPITOR) tablet 20 mg (has no administration in time range)  citalopram (CELEXA) tablet 10 mg (has no administration in time range)  docusate sodium (COLACE) capsule 100 mg (has no administration in time range)  donepezil (ARICEPT) tablet 10 mg (has no administration in time range)  HYDROcodone-acetaminophen (NORCO/VICODIN) 5-325 MG per tablet 1 tablet (has no administration in time range)  latanoprost (XALATAN) 0.005 % ophthalmic solution 1 drop (has no administration in time range)  lisinopril (PRINIVIL,ZESTRIL) tablet 2.5 mg (has no administration in time range)  memantine (NAMENDA XR) 24 hr capsule 28 mg (has no administration in time range)  senna-docusate (Senokot-S) tablet 2 tablet (has no administration in time range)  tamsulosin (FLOMAX) capsule 0.4 mg (has no administration in time range)  insulin aspart (novoLOG) injection 0-9 Units (0 Units Subcutaneous Not Given 01/05/18 0014)  heparin injection 5,000 Units (has no administration in time range)  acetaminophen (TYLENOL) tablet 650 mg (has no administration in time range)    Or  acetaminophen (TYLENOL) suppository 650 mg (has no administration in time range)  ondansetron (ZOFRAN) tablet 4 mg (has no administration in time range)    Or  ondansetron (ZOFRAN) injection 4 mg (has no administration in time range)  cefTRIAXone (ROCEPHIN) 1 g in sodium chloride 0.9 % 100 mL IVPB (has no administration in time range)  0.9 %  sodium chloride infusion (has no administration in time range)  sodium chloride 0.9 % bolus 1,000 mL  (0 mLs Intravenous Stopped 01/04/18 2220)  cefTRIAXone (ROCEPHIN) 1 g in sodium chloride 0.9 % 100 mL IVPB (0 g Intravenous Stopped 01/04/18 1955)     Initial Impression / Assessment and Plan / ED Course  I have reviewed the triage vital signs and the nursing notes.  Pertinent labs & imaging results that were available during my care of the patient were reviewed by me and considered in my medical decision making (see chart for details).  This is an 82 year old male with history of Alzheimer's dementia presenting to the ED for altered mental status and poor p.o. intake as above.  Clinical picture was consistent with UTI.  UA shows signs of infection.  Patient given IV fluids and Rocephin.  CT head shows no acute findings.  With his swallowing difficulty and depressed mental status, patient admitted to hospitalist for parenteral antibiotics and hydration.  Final Clinical Impressions(s) / ED Diagnoses   Final diagnoses:  Urinary tract infection without hematuria, site unspecified     Prescilla Sours, MD 01/05/18 0144    Deno Etienne, DO 01/05/18 1107

## 2018-01-04 NOTE — ED Triage Notes (Signed)
Per EMS: pt from [facility] with c/o AMS.  Staff reported LSN 8-9 days ago, though could not report a cognitive baseline.  Pt responds to painful stimuli. Reported lethargic for several days with incomprehensible speech.  HX of dementia and DM.

## 2018-01-05 ENCOUNTER — Other Ambulatory Visit: Payer: Self-pay

## 2018-01-05 DIAGNOSIS — F0280 Dementia in other diseases classified elsewhere without behavioral disturbance: Secondary | ICD-10-CM | POA: Diagnosis not present

## 2018-01-05 DIAGNOSIS — N390 Urinary tract infection, site not specified: Principal | ICD-10-CM

## 2018-01-05 DIAGNOSIS — E1151 Type 2 diabetes mellitus with diabetic peripheral angiopathy without gangrene: Secondary | ICD-10-CM

## 2018-01-05 DIAGNOSIS — G308 Other Alzheimer's disease: Secondary | ICD-10-CM

## 2018-01-05 LAB — CBC WITH DIFFERENTIAL/PLATELET
Abs Immature Granulocytes: 0.1 10*3/uL (ref 0.0–0.1)
BASOS ABS: 0.1 10*3/uL (ref 0.0–0.1)
BASOS PCT: 1 %
Eosinophils Absolute: 0.3 10*3/uL (ref 0.0–0.7)
Eosinophils Relative: 3 %
HCT: 34.6 % — ABNORMAL LOW (ref 39.0–52.0)
HEMOGLOBIN: 11.5 g/dL — AB (ref 13.0–17.0)
Immature Granulocytes: 1 %
Lymphocytes Relative: 12 %
Lymphs Abs: 1.2 10*3/uL (ref 0.7–4.0)
MCH: 29.7 pg (ref 26.0–34.0)
MCHC: 33.2 g/dL (ref 30.0–36.0)
MCV: 89.4 fL (ref 78.0–100.0)
MONO ABS: 0.9 10*3/uL (ref 0.1–1.0)
Monocytes Relative: 9 %
Neutro Abs: 7.8 10*3/uL — ABNORMAL HIGH (ref 1.7–7.7)
Neutrophils Relative %: 76 %
PLATELETS: 294 10*3/uL (ref 150–400)
RBC: 3.87 MIL/uL — ABNORMAL LOW (ref 4.22–5.81)
RDW: 14 % (ref 11.5–15.5)
WBC: 10.3 10*3/uL (ref 4.0–10.5)

## 2018-01-05 LAB — COMPREHENSIVE METABOLIC PANEL
ALK PHOS: 62 U/L (ref 38–126)
ALT: 33 U/L (ref 0–44)
ANION GAP: 8 (ref 5–15)
AST: 28 U/L (ref 15–41)
Albumin: 2.8 g/dL — ABNORMAL LOW (ref 3.5–5.0)
BUN: 9 mg/dL (ref 8–23)
CALCIUM: 8.3 mg/dL — AB (ref 8.9–10.3)
CO2: 24 mmol/L (ref 22–32)
Chloride: 104 mmol/L (ref 98–111)
Creatinine, Ser: 1.06 mg/dL (ref 0.61–1.24)
GFR calc non Af Amer: >60 mL/min (ref >60)
Glucose, Bld: 102 mg/dL — ABNORMAL HIGH (ref 70–99)
Potassium: 3.6 mmol/L (ref 3.5–5.1)
SODIUM: 136 mmol/L (ref 135–145)
Total Bilirubin: 0.7 mg/dL (ref 0.3–1.2)
Total Protein: 6.8 g/dL (ref 6.5–8.1)

## 2018-01-05 LAB — CBG MONITORING, ED: GLUCOSE-CAPILLARY: 109 mg/dL — AB (ref 70–99)

## 2018-01-05 LAB — GLUCOSE, CAPILLARY
GLUCOSE-CAPILLARY: 146 mg/dL — AB (ref 70–99)
Glucose-Capillary: 100 mg/dL — ABNORMAL HIGH (ref 70–99)
Glucose-Capillary: 100 mg/dL — ABNORMAL HIGH (ref 70–99)
Glucose-Capillary: 128 mg/dL — ABNORMAL HIGH (ref 70–99)
Glucose-Capillary: 171 mg/dL — ABNORMAL HIGH (ref 70–99)

## 2018-01-05 LAB — MRSA PCR SCREENING: MRSA BY PCR: NEGATIVE

## 2018-01-05 LAB — AMMONIA: AMMONIA: 38 umol/L — AB (ref 9–35)

## 2018-01-05 MED ORDER — SODIUM CHLORIDE 0.9 % IV SOLN
INTRAVENOUS | Status: AC
  Administered 2018-01-05: 11:00:00 via INTRAVENOUS

## 2018-01-05 NOTE — Progress Notes (Signed)
Nutrition Brief Note  Patient identified on the Malnutrition Screening Tool (MST) Report  Wt Readings from Last 15 Encounters:  01/05/18 195 lb 12.3 oz (88.8 kg)  11/21/16 200 lb (90.7 kg)  10/09/16 206 lb 6.4 oz (93.6 kg)  09/05/16 211 lb (95.7 kg)  06/24/16 197 lb 11.2 oz (89.7 kg)  06/03/16 197 lb 11.2 oz (89.7 kg)  04/23/16 189 lb 3.2 oz (85.8 kg)  04/15/16 189 lb 3.2 oz (85.8 kg)  03/06/16 188 lb 14.4 oz (85.7 kg)  03/01/16 188 lb 15 oz (85.7 kg)  02/22/16 180 lb 3.2 oz (81.7 kg)  02/17/16 176 lb 4.8 oz (80 kg)  02/08/16 203 lb 3.2 oz (92.2 kg)  01/08/16 198 lb 12.8 oz (90.2 kg)  12/08/15 208 lb 6.4 oz (94.5 kg)   Derrick Rubio is a 82 y.o. male with medical history significant for Alzheimer dementia, chronic kidney disease stage II-III, depression, and insulin-dependent diabetes mellitus, now presenting to the emergency department from his SNF for evaluation of the lethargy.  Pt admitted with lethargy and UTI. PTA was residing at [facility].   7/22- s/p BSE- advanced to dysphagia 1 diet with thin liquids  Spoke with pt wife and son at bedside. Both report pt typically has a great appetite (on a mechanical soft diet PTA). They report decreased intake over the past week secondary to vomiting- they share that pt was placed on a liquid diet and transitioned back to solid foods on 01/02/18.   Pt wife unsure of UBW but estimates that pt lost "a few pounds" over the past few months. She reports UBW around 190#.   Pt wife is very attentive; feeds pt daily at SNF and has been doing so here as well.   Last Hgb A1c: 7.2 (09/18/2016). PTA DM medications 18 units insulin glargine q HS and 0-9 units insulin lispro TID and q HS. Pt wife confirms that CBGS are well controlled PTA- typically range between 100-150 (noted readings range between 93-218 over the past month).   Labs reviewed: CBGS: 100-109 (inpatient orders for glycemic control are 0-9 units insulin aspart every 4  hours).  Nutrition-Focused physical exam completed. Findings are mild fat depletion, no muscle depletion, and mild edema.   Body mass index is 25.83 kg/m. Patient meets criteria for overweight  based on current BMI.   Current diet order is dysphagia 1 with thin liquids, patient is consuming approximately 100% of meals at this time. Labs and medications reviewed.   No nutrition interventions warranted at this time. If nutrition issues arise, please consult RD.   Amoreena Neubert A. Jimmye Norman, RD, LDN, CDE Pager: [phone] After hours Pager: [phone]

## 2018-01-05 NOTE — Evaluation (Signed)
Clinical/Bedside Swallow Evaluation Patient Details  Name: Derrick Rubio MRN: [id number] Date of Birth: [birth year]  Today's Date: 01/05/2018 Time: SLP Start Time (ACUTE ONLY): 0855 SLP Stop Time (ACUTE ONLY): 0908 SLP Time Calculation (min) (ACUTE ONLY): 13 min  Past Medical History:  Past Medical History:  Diagnosis Date  . Alzheimer's disease   . CKD (chronic kidney disease)   . Dementia   . 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 Diabetes mellitus without complication ([place])   . DVT (deep venous thrombosis) ([place]) 09/16/2013  . Glaucoma   . Hyperlipemia   . Latent tuberculosis 09/16/2013  . Osteoarthritis 09/16/2013  . Prostate cancer ([place]) 2006   s/p seed radiation  . Pure hypercholesterolemia 10/18/2013  . Seizure-like activity [facility])    Past Surgical History:  Past Surgical History:  Procedure Laterality Date  . COLONOSCOPY WITH PROPOFOL N/A 01/24/2014   Procedure: COLONOSCOPY WITH PROPOFOL;  Surgeon: Garlan Fair, MD;  Location: [facility];  Service: Endoscopy;  Laterality: N/A;  . ivc filter placement     HPI:  Patient is an 82 year old male with history of Alzheimer's dementia, CKD, and DM presenting to the ED from SNF for AMS and concern for aspiration. CT Head negative, CXR without acute infection. AMS suspected to be from UTI and/or advancing dementia. Pt had two BSEs in September 2017, both recommending Dys 1 diet and thin liquids due to cognitively-based oral dysphagia. These were also noted to be his baseline diet textures at SNF at that time.   Assessment / Plan / Recommendation Clinical Impression  Pt needed Mod cues to inititate bolus acceptance with first trial, then opening his mouth spontaneously to anticipate future boluses. Min cues were needed for intermittent, mild oral holding. His vocal quality remained clear across trials, but then about one minute after POs had stopped he had an episode of delayed coughing. Oral suction yielded minimal amounts of thin  secretions, without evidence of applesauce consumed. CXR on admission was clear. Question if this could be reflective of an esophageal component. Recommend restarting Dys 1 diet and thin liquids as has previously been reported as his baseline diet. Would keep his HOB elevated after meals and alternate solids/liquids. SLP will f/u for tolerance. SLP Visit Diagnosis: Dysphagia, unspecified (R13.10)    Aspiration Risk  Mild aspiration risk;Moderate aspiration risk    Diet Recommendation Dysphagia 1 (Puree);Thin liquid   Liquid Administration via: Straw Medication Administration: Crushed with puree Supervision: Staff to assist with self feeding;Full supervision/cueing for compensatory strategies Compensations: Minimize environmental distractions;Slow rate;Small sips/bites;Follow solids with liquid Postural Changes: Seated upright at 90 degrees;Remain upright for at least 30 minutes after po intake    Other  Recommendations Oral Care Recommendations: Oral care BID Other Recommendations: Have oral suction available   Follow up Recommendations Skilled Nursing facility      Frequency and Duration min 2x/week  2 weeks       Prognosis Prognosis for Safe Diet Advancement: (baseline diet)      Swallow Study   General HPI: Patient is an 82 year old male with history of Alzheimer's dementia, CKD, and DM presenting to the ED from SNF for AMS and concern for aspiration. CT Head negative, CXR without acute infection. AMS suspected to be from UTI and/or advancing dementia. Pt had two BSEs in September 2017, both recommending Dys 1 diet and thin liquids due to cognitively-based oral dysphagia. These were also noted to be his baseline diet textures at SNF at that time. Type of Study: Bedside Swallow Evaluation  Previous Swallow Assessment: see HPI Diet Prior to this Study: NPO Temperature Spikes Noted: No Respiratory Status: Room air History of Recent Intubation: No Behavior/Cognition:  Alert;Cooperative;Requires cueing Oral Cavity Assessment: Other (comment)(white-ish coating on tongue? doesn't open mouth to command) Oral Care Completed by SLP: No Oral Cavity - Dentition: (? missing dentition) Vision: (keeps eyes closed) Self-Feeding Abilities: Total assist Patient Positioning: Upright in bed Baseline Vocal Quality: Low vocal intensity Volitional Cough: Cognitively unable to elicit Volitional Swallow: Able to elicit    Oral/Motor/Sensory Function Overall Oral Motor/Sensory Function: (doesn't follow commands consistently but appears WFL)   Ice Chips Ice chips: Not tested   Thin Liquid Thin Liquid: Impaired Presentation: Straw Oral Phase Impairments: Poor awareness of bolus Oral Phase Functional Implications: Oral holding Pharyngeal  Phase Impairments: Cough - Delayed    Nectar Thick Nectar Thick Liquid: Not tested   Honey Thick Honey Thick Liquid: Not tested   Puree Puree: Impaired Presentation: Spoon Oral Phase Impairments: Poor awareness of bolus Pharyngeal Phase Impairments: Cough - Delayed   Solid   GO   Solid: Not tested        Germain Osgood 01/05/2018,9:24 AM  Germain Osgood, M.A. CCC-SLP [phone]

## 2018-01-05 NOTE — Social Work (Signed)
CSW acknowledging pts arrival from SNF at [facility], will follow to support disposition.   Alexander [place], [place] Work [phone]

## 2018-01-05 NOTE — Progress Notes (Signed)
Progress Note    Derrick Rubio  JSE:[id number] DOB: 1936-06-07  DOA: 01/04/2018 PCP: Crist Infante, FNP    Brief Narrative:    Medical records reviewed and are as summarized below:  Derrick Rubio is an 82 y.o. male with medical history significant for Alzheimer dementia, chronic kidney disease stage II-III, depression, and insulin-dependent diabetes mellitus, now presenting to the emergency department from his SNF for evaluation of the lethargy.  Patient is accompanied by his wife who assists with the history.  He has reportedly been more somnolent for more than a week now at the SNF and they had difficulty getting him to respond this evening, prompting transport to the ED for further evaluation.  He has not been complaining of anything per report of his spouse and has not appeared to have any dyspnea or cough.  There has not been any shaking or posturing.  No vomiting or diarrhea reported.  No recent fall or trauma.  He does not use alcohol or illicit substances.  No fevers have been documented.  There was some concern that the patient may have been aspirating at the facility, but again no dyspnea or cough.    Assessment/Plan:   Principal Problem:   Acute lower UTI Active Problems:   Alzheimer's dementia without behavioral disturbance   Type 2 diabetes with peripheral circulatory disorder, controlled (HCC)   Major depression, chronic   CKD (chronic kidney disease), stage III (HCC)   UTI (urinary tract infection)   UTI  - Presents from SNF with increased lethargy for more than a week  - ED workup suggests possible UTI  - He was treated with 1 g IV Rocephin in ED -urine still not sent and abx have been given - Continue current antibiotic-- no other cultures in epic to help steer abx -post void residual -leukocytosis resolved  Lethargy  - Presents from SNF with lethargy for more than a week, was reportedly difficult to rouse at the SNF, -after 1 liter NS and Rocephin  in ED he was improving - Likely secondary to UTI and/or advancing dementia  - treat UTI  Dementia  - Wife reports that patient typically answers simple questions, but has been somnolent and not speaking last several days  - Resume Namenda and Aricept when he is d/c'd  AKI - SCr is 1.31 on admission- improved with IVF to <1 -IVF for 12 hours   Type II DM  - A1c 7.2% in April 2018  - Managed at the SNF with Lantus 18 units qHS and Humalog 0-9 units BID  - SSI for now  Hyperbilirubinemia  - resolved -? From dehydration  Dysphagia - There was concern at the SNF that patient could be aspirating  - DYS diet 1 with aspiration precautions  Patient from long term facility and does not walk at baseline, staff dresses and help with ALDs per wife     Family Communication/Anticipated D/C date and plan/Code Status   DVT prophylaxis: heparin. Code Status: DNR Family Communication: spoke with wife on phone Disposition Plan: back to facility 24-48 hours  Medical Consultants:    None.    Subjective:   Will awaken and answer yes and no to questions  Objective:    Vitals:   01/04/18 2330 01/05/18 0100 01/05/18 0253 01/05/18 0425  BP: 121/74 121/62 118/66 120/64  Pulse: 74 76 76 72  Resp: 15 14 15 14   Temp:  98.8 F (37.1 C) 98.8 F (37.1 C) 98.6 F (37 C)  TempSrc:  Oral Oral Oral  SpO2: 98% 100% 98% 100%  Weight:  88.8 kg (195 lb 12.3 oz)    Height:  6\' 1"  (1.854 m)      Intake/Output Summary (Last 24 hours) at 01/05/2018 0951 Last data filed at 01/05/2018 5643 Gross per 24 hour  Intake 1333.63 ml  Output 150 ml  Net 1183.63 ml   Filed Weights   01/05/18 0100  Weight: 88.8 kg (195 lb 12.3 oz)    Exam: In bed, NAD Will open eyes and respond with yes and no answers +BS, soft, NT No rashes or lesions rrr  Data Reviewed:   I have personally reviewed following labs and imaging studies:  Labs: Labs show the following:   Basic Metabolic  Panel: Recent Labs  Lab 01/04/18 1745 01/04/18 1917 01/05/18 0549  NA 134* 136 136  K 6.4* 3.8 3.6  CL 100 100 104  CO2 25 28 24   GLUCOSE 128* 126* 102*  BUN 9 9 9   CREATININE 1.20 1.31* 1.06  CALCIUM 8.2* 8.8* 8.3*   GFR Estimated Creatinine Clearance: 62.8 mL/min (by C-G formula based on SCr of 1.06 mg/dL). Liver Function Tests: Recent Labs  Lab 01/04/18 1745 01/05/18 0549  AST 56* 28  ALT 43 33  ALKPHOS 77 62  BILITOT 2.4* 0.7  PROT 6.3* 6.8  ALBUMIN 2.9* 2.8*   No results for input(s): LIPASE, AMYLASE in the last 168 hours. Recent Labs  Lab 01/05/18 0549  AMMONIA 38*   Coagulation profile No results for input(s): INR, PROTIME in the last 168 hours.  CBC: Recent Labs  Lab 01/04/18 1917 01/05/18 0549  WBC 12.0* 10.3  NEUTROABS 9.3* 7.8*  HGB 12.4* 11.5*  HCT 37.6* 34.6*  MCV 90.2 89.4  PLT 292 294   Cardiac Enzymes: No results for input(s): CKTOTAL, CKMB, CKMBINDEX, TROPONINI in the last 168 hours. BNP (last 3 results) No results for input(s): PROBNP in the last 8760 hours. CBG: Recent Labs  Lab 01/04/18 1803 01/05/18 0008 01/05/18 0508 01/05/18 0859  GLUCAP 122* 109* 100* 100*   D-Dimer: No results for input(s): DDIMER in the last 72 hours. Hgb A1c: No results for input(s): HGBA1C in the last 72 hours. Lipid Profile: No results for input(s): CHOL, HDL, LDLCALC, TRIG, CHOLHDL, LDLDIRECT in the last 72 hours. Thyroid function studies: No results for input(s): TSH, T4TOTAL, T3FREE, THYROIDAB in the last 72 hours.  Invalid input(s): FREET3 Anemia work up: No results for input(s): VITAMINB12, FOLATE, FERRITIN, TIBC, IRON, RETICCTPCT in the last 72 hours. Sepsis Labs: Recent Labs  Lab 01/04/18 1917 01/05/18 0549  WBC 12.0* 10.3    Microbiology Recent Results (from the past 240 hour(s))  MRSA PCR Screening     Status: None   Collection Time: 01/05/18  1:50 AM  Result Value Ref Range Status   MRSA by PCR NEGATIVE NEGATIVE Final     Comment:        The GeneXpert MRSA Assay (FDA approved for NASAL specimens only), is one component of a comprehensive MRSA colonization surveillance program. It is not intended to diagnose MRSA infection nor to guide or monitor treatment for MRSA infections. Performed at [facility], [place] [address], [place], [place] [address]     Procedures and diagnostic studies:  Ct Head Wo Contrast  Result Date: 01/04/2018 CLINICAL DATA:  Altered level of consciousness EXAM: CT HEAD WITHOUT CONTRAST TECHNIQUE: Contiguous axial images were obtained from the base of the skull through the vertex without intravenous contrast. COMPARISON:  None. FINDINGS: Brain: No acute intracranial hemorrhage. No focal mass lesion. No CT evidence of acute infarction. No midline shift or mass effect. No hydrocephalus. Basilar cisterns are patent. There are periventricular and subcortical white matter hypodensities. Generalized cortical atrophy. Ventricular dilatation proportional atrophy. Vascular: No hyperdense vessel or unexpected calcification. Skull: Normal. Negative for fracture or focal lesion. Sinuses/Orbits: No acute finding. Other: None. IMPRESSION: 1. No acute intracranial findings. 2. Chronic atrophy and white matter microvascular disease. 3. No change from 02/29/2016 Electronically Signed   By: Suzy Bouchard M.D.   On: 01/04/2018 18:32   Dg Chest Portable 1 View  Result Date: 01/04/2018 CLINICAL DATA:  Coughing, choking, and hiccuping for 1 week. Altered mental status beginning today. EXAM: PORTABLE CHEST 1 VIEW COMPARISON:  11/21/2016 FINDINGS: The heart size and mediastinal contours are within normal limits. Aortic atherosclerosis. Low lung volumes and bibasilar atelectasis again seen. No evidence of pulmonary consolidation or edema. No evidence of pneumothorax or pleural effusion. The visualized skeletal structures are unremarkable. IMPRESSION: No significant change in low lung volumes and mild bibasilar  atelectasis. Electronically Signed   By: Earle Gell M.D.   On: 01/04/2018 17:42    Medications:   . atorvastatin  20 mg Oral q1800  . citalopram  10 mg Oral Daily  . docusate sodium  100 mg Oral QHS  . donepezil  10 mg Oral QHS  . heparin  5,000 Units Subcutaneous Q8H  . insulin aspart  0-9 Units Subcutaneous Q4H  . latanoprost  1 drop Both Eyes QHS  . lisinopril  2.5 mg Oral Daily  . memantine  28 mg Oral q1800  . senna-docusate  2 tablet Oral QHS  . tamsulosin  0.4 mg Oral q1800   Continuous Infusions: . sodium chloride 100 mL/hr at 01/05/18 0143  . cefTRIAXone (ROCEPHIN)  IV       LOS: 0 days   Geradine Girt  [facility]   *Please refer to [email or web address], password [email or web address] to get updated schedule on who will round on this patient, as hospitalists switch teams weekly. If 7PM-7AM, please contact night-coverage at [email or web address], password [email or web address] for any overnight needs.  01/05/2018, 9:51 AM

## 2018-01-05 NOTE — NC FL2 (Signed)
[place] MEDICAID FL2 LEVEL OF CARE SCREENING TOOL     IDENTIFICATION  Patient Name: Derrick Rubio Birthdate: April 23, 1936 Sex: male Admission Date (Current Location): 01/04/2018  [facility] and [place] Number:  Herbalist and Address:  [facility], [place] [address] [place], [place] [address]      Provider Number: [id number]  Attending Physician Name and Address:  Geradine Girt, DO  Relative Name and Phone Number:  Soua Caltagirone; wife; [phone]    Current Level of Care: Hospital Recommended Level of Care: [place] Prior Approval Number:    Date Approved/Denied:   PASRR Number: [id number]  Discharge Plan: SNF    Current Diagnoses: Patient Active Problem List   Diagnosis Date Noted  . CKD (chronic kidney disease), stage III ([place]) 01/04/2018  . Acute lower UTI 01/04/2018  . UTI (urinary tract infection) 01/04/2018  . History of DVT (deep vein thrombosis) 04/15/2016  . Protein-calorie malnutrition, severe 03/01/2016  . Dehydration 02/17/2016  . Major depression, chronic 12/08/2015  . Chronic constipation 11/09/2015  . Microalbuminuria due to type 2 diabetes mellitus ([place]) 09/07/2015  . BPH without obstruction/lower urinary tract symptoms 10/24/2014  . Hyperlipidemia LDL goal <100 10/24/2014  . Type 2 diabetes with peripheral circulatory disorder, controlled ([place]) 10/24/2014  . Glaucoma 11/02/2013  . Alzheimer's dementia without behavioral disturbance 09/16/2013  . Osteoarthritis 09/16/2013    Orientation RESPIRATION BLADDER Height & Weight     Self  Normal Incontinent Weight: 195 lb 12.3 oz (88.8 kg) Height:  6\' 1"  (185.4 cm)  BEHAVIORAL SYMPTOMS/MOOD NEUROLOGICAL BOWEL NUTRITION STATUS      Continent Diet(see discharge summary)  AMBULATORY STATUS COMMUNICATION OF NEEDS Skin   Total Care Verbally Normal                       Personal Care Assistance Level of Assistance  Bathing, Feeding,  Dressing Bathing Assistance: Maximum assistance Feeding assistance: Maximum assistance Dressing Assistance: Maximum assistance     Functional Limitations Info  Hearing, Speech, Sight Sight Info: Adequate Hearing Info: Adequate Speech Info: Adequate    SPECIAL CARE FACTORS FREQUENCY                       Contractures Contractures Info: Not present    Additional Factors Info  Code Status, Allergies, Psychotropic, Insulin Sliding Scale Code Status Info: DNR Allergies Info: No Known Allergies Psychotropic Info: citalopram (CELEXA) tablet 10 mg daily PO; donepezil (ARICEPT) tablet 10 mg daily at bedtime PO; memantine (NAMENDA XR) 24 hr capsule 28 mg daily @ 18:00 PO Insulin Sliding Scale Info: insulin aspart (novoLOG) injection 0-9 Units every 4 hours       Current Medications (01/05/2018):  This is the current hospital active medication list Current Facility-Administered Medications  Medication Dose Route Frequency Provider Last Rate Last Dose  . 0.9 %  sodium chloride infusion   Intravenous Continuous Eulogio Bear U, DO 100 mL/hr at 01/05/18 1032    . acetaminophen (TYLENOL) tablet 650 mg  650 mg Oral Q6H PRN Opyd, Ilene Qua, MD       Or  . acetaminophen (TYLENOL) suppository 650 mg  650 mg Rectal Q6H PRN Opyd, Ilene Qua, MD      . atorvastatin (LIPITOR) tablet 20 mg  20 mg Oral q1800 Opyd, Ilene Qua, MD      . cefTRIAXone (ROCEPHIN) 1 g in sodium chloride 0.9 % 100 mL IVPB  1 g Intravenous Q24H  Vianne Bulls, MD      . citalopram (CELEXA) tablet 10 mg  10 mg Oral Daily Opyd, Ilene Qua, MD   10 mg at 01/05/18 1033  . docusate sodium (COLACE) capsule 100 mg  100 mg Oral QHS Opyd, Timothy S, MD      . donepezil (ARICEPT) tablet 10 mg  10 mg Oral QHS Opyd, Ilene Qua, MD      . heparin injection 5,000 Units  5,000 Units Subcutaneous Q8H Opyd, Ilene Qua, MD   5,000 Units at 01/05/18 0740  . HYDROcodone-acetaminophen (NORCO/VICODIN) 5-325 MG per tablet 1 tablet  1 tablet Oral  Q8H PRN Opyd, Ilene Qua, MD      . insulin aspart (novoLOG) injection 0-9 Units  0-9 Units Subcutaneous Q4H Opyd, Timothy S, MD      . latanoprost (XALATAN) 0.005 % ophthalmic solution 1 drop  1 drop Both Eyes QHS Opyd, Ilene Qua, MD   1 drop at 01/05/18 0202  . lisinopril (PRINIVIL,ZESTRIL) tablet 2.5 mg  2.5 mg Oral Daily Opyd, Ilene Qua, MD   2.5 mg at 01/05/18 1032  . memantine (NAMENDA XR) 24 hr capsule 28 mg  28 mg Oral q1800 Opyd, Ilene Qua, MD      . ondansetron (ZOFRAN) tablet 4 mg  4 mg Oral Q6H PRN Opyd, Ilene Qua, MD       Or  . ondansetron (ZOFRAN) injection 4 mg  4 mg Intravenous Q6H PRN Opyd, Ilene Qua, MD      . senna-docusate (Senokot-S) tablet 2 tablet  2 tablet Oral QHS Opyd, Ilene Qua, MD      . tamsulosin (FLOMAX) capsule 0.4 mg  0.4 mg Oral q1800 Opyd, Ilene Qua, MD         Discharge Medications: Please see discharge summary for a list of discharge medications.  Relevant Imaging Results:  Relevant Lab Results:   Additional Information [id number] [place]  [place], [place]

## 2018-01-06 ENCOUNTER — Observation Stay (HOSPITAL_COMMUNITY): Payer: Medicare Other

## 2018-01-06 ENCOUNTER — Encounter (HOSPITAL_COMMUNITY): Payer: Self-pay

## 2018-01-06 DIAGNOSIS — Z79890 Hormone replacement therapy (postmenopausal): Secondary | ICD-10-CM | POA: Diagnosis not present

## 2018-01-06 DIAGNOSIS — R627 Adult failure to thrive: Secondary | ICD-10-CM | POA: Diagnosis not present

## 2018-01-06 DIAGNOSIS — R509 Fever, unspecified: Secondary | ICD-10-CM

## 2018-01-06 DIAGNOSIS — J690 Pneumonitis due to inhalation of food and vomit: Secondary | ICD-10-CM | POA: Diagnosis not present

## 2018-01-06 DIAGNOSIS — Z66 Do not resuscitate: Secondary | ICD-10-CM | POA: Diagnosis not present

## 2018-01-06 DIAGNOSIS — Z8249 Family history of ischemic heart disease and other diseases of the circulatory system: Secondary | ICD-10-CM | POA: Diagnosis not present

## 2018-01-06 DIAGNOSIS — H409 Unspecified glaucoma: Secondary | ICD-10-CM | POA: Diagnosis not present

## 2018-01-06 DIAGNOSIS — E860 Dehydration: Secondary | ICD-10-CM | POA: Diagnosis not present

## 2018-01-06 DIAGNOSIS — G308 Other Alzheimer's disease: Secondary | ICD-10-CM | POA: Diagnosis not present

## 2018-01-06 DIAGNOSIS — N179 Acute kidney failure, unspecified: Secondary | ICD-10-CM | POA: Diagnosis not present

## 2018-01-06 DIAGNOSIS — Z6825 Body mass index (BMI) 25.0-25.9, adult: Secondary | ICD-10-CM | POA: Diagnosis not present

## 2018-01-06 DIAGNOSIS — E1151 Type 2 diabetes mellitus with diabetic peripheral angiopathy without gangrene: Secondary | ICD-10-CM | POA: Diagnosis not present

## 2018-01-06 DIAGNOSIS — E7800 Pure hypercholesterolemia, unspecified: Secondary | ICD-10-CM | POA: Diagnosis not present

## 2018-01-06 DIAGNOSIS — B951 Streptococcus, group B, as the cause of diseases classified elsewhere: Secondary | ICD-10-CM | POA: Diagnosis not present

## 2018-01-06 DIAGNOSIS — M1990 Unspecified osteoarthritis, unspecified site: Secondary | ICD-10-CM | POA: Diagnosis not present

## 2018-01-06 DIAGNOSIS — Z79899 Other long term (current) drug therapy: Secondary | ICD-10-CM | POA: Diagnosis not present

## 2018-01-06 DIAGNOSIS — Z8546 Personal history of malignant neoplasm of prostate: Secondary | ICD-10-CM | POA: Diagnosis not present

## 2018-01-06 DIAGNOSIS — R17 Unspecified jaundice: Secondary | ICD-10-CM | POA: Diagnosis not present

## 2018-01-06 DIAGNOSIS — N182 Chronic kidney disease, stage 2 (mild): Secondary | ICD-10-CM | POA: Diagnosis not present

## 2018-01-06 DIAGNOSIS — F0280 Dementia in other diseases classified elsewhere without behavioral disturbance: Secondary | ICD-10-CM | POA: Diagnosis present

## 2018-01-06 DIAGNOSIS — N400 Enlarged prostate without lower urinary tract symptoms: Secondary | ICD-10-CM | POA: Diagnosis not present

## 2018-01-06 DIAGNOSIS — E1122 Type 2 diabetes mellitus with diabetic chronic kidney disease: Secondary | ICD-10-CM | POA: Diagnosis not present

## 2018-01-06 DIAGNOSIS — N390 Urinary tract infection, site not specified: Secondary | ICD-10-CM | POA: Diagnosis present

## 2018-01-06 DIAGNOSIS — Z794 Long term (current) use of insulin: Secondary | ICD-10-CM | POA: Diagnosis not present

## 2018-01-06 DIAGNOSIS — F329 Major depressive disorder, single episode, unspecified: Secondary | ICD-10-CM | POA: Diagnosis not present

## 2018-01-06 DIAGNOSIS — R1310 Dysphagia, unspecified: Secondary | ICD-10-CM | POA: Diagnosis not present

## 2018-01-06 DIAGNOSIS — G309 Alzheimer's disease, unspecified: Secondary | ICD-10-CM | POA: Diagnosis not present

## 2018-01-06 LAB — GLUCOSE, CAPILLARY
GLUCOSE-CAPILLARY: 133 mg/dL — AB (ref 70–99)
GLUCOSE-CAPILLARY: 156 mg/dL — AB (ref 70–99)
GLUCOSE-CAPILLARY: 230 mg/dL — AB (ref 70–99)
Glucose-Capillary: 136 mg/dL — ABNORMAL HIGH (ref 70–99)
Glucose-Capillary: 143 mg/dL — ABNORMAL HIGH (ref 70–99)
Glucose-Capillary: 149 mg/dL — ABNORMAL HIGH (ref 70–99)
Glucose-Capillary: 158 mg/dL — ABNORMAL HIGH (ref 70–99)
Glucose-Capillary: 179 mg/dL — ABNORMAL HIGH (ref 70–99)

## 2018-01-06 LAB — URINE CULTURE: Culture: >=100000 — AB

## 2018-01-06 MED ORDER — SODIUM CHLORIDE 0.9 % IV SOLN
500.0000 mg | INTRAVENOUS | Status: DC
  Administered 2018-01-06: 500 mg via INTRAVENOUS
  Filled 2018-01-06 (×2): qty 500

## 2018-01-06 MED ORDER — ACETAMINOPHEN 325 MG PO TABS
650.0000 mg | ORAL_TABLET | Freq: Four times a day (QID) | ORAL | Status: DC
  Administered 2018-01-07 (×3): 650 mg via ORAL
  Filled 2018-01-06 (×3): qty 2

## 2018-01-06 NOTE — Care Management Note (Signed)
Case Management Note  Patient Details  Name: Derrick Rubio MRN: [id number] Date of Birth: [birth year]  Subjective/Objective:                 Spoke w patient's wife at bedside, plan is to return to [facility]. CSW aware and following.   Action/Plan:   Expected Discharge Date:                  Expected Discharge Plan:  Skilled Nursing Facility  In-House Referral:  Clinical Social Work  Discharge planning Services  CM Consult  Post Acute Care Choice:    Choice offered to:     DME Arranged:    DME Agency:     HH Arranged:    [place] Agency:     Status of Service:  Completed, signed off  If discussed at [employer], dates discussed:    Additional Comments:  Carles Collet, RN 01/06/2018, 9:57 AM

## 2018-01-06 NOTE — Care Management Obs Status (Signed)
[facility] NOTIFICATION   Patient Details  Name: Derrick Rubio MRN: [id number] Date of Birth: 1936/01/26   Medicare Observation Status Notification Given:  Yes    Carles Collet, RN 01/06/2018, 9:57 AM

## 2018-01-06 NOTE — Clinical Social Work Note (Signed)
Clinical Social Work Assessment  Patient Details  Name: Derrick Rubio MRN: [id number] Date of Birth: [birth year]  Date of referral:  01/06/18               Reason for consult:  Facility Placement                Permission sought to share information with:  Facility Sport and exercise psychologist, Family Supports Permission granted to share information::  No(pt only oriented to self)  Name::     Paulene Floor  Agency::  [facility]  Relationship::  wife  Contact Information:  [phone]  Housing/Transportation Living arrangements for the past 2 months:  [place] of Information:  Spouse Patient Interpreter Needed:  None Criminal Activity/Legal Involvement Pertinent to Current Situation/Hospitalization:  No - Comment as needed Significant Relationships:  Adult Children, Other Family Members, Spouse Lives with:  Facility Resident Do you feel safe going back to the place where you live?  Yes Need for family participation in patient care:  Yes (Comment)  Care giving concerns: Pt LTC resident at [facility], will return, pt wife unable to meet all of pt care needs at home.    Social Worker assessment / plan: CSW spoke with pt wife Katharine Look on the phone, pt wife states that pt has lived at [facility] for about 4 years. She would like pt to return at discharge. Pt also has two sons, one in [place] and one stationed in the [employer] in [place]. All questions answered. Will continue to follow to support d/c back to [facility] when medically appropriate.   Employment status:  Retired Nurse, adult PT Recommendations:  Not assessed at this time Information / Referral to community resources:  [place]  Patient/Family's Response to care:  Pt wife amenable to CSW support and return to [facility].  Patient/Family's Understanding of and Emotional Response to Diagnosis, Current Treatment, and Prognosis:  Pt wife states understanding  of diagnosis, current treatment, and prognosis. Pt wife is supportive and realistic with pt care needs and need for support and supervision. Pt wife sounded emotionally appropriate on phone, all questions answered.   Emotional Assessment Appearance:  Appears stated age Attitude/Demeanor/Rapport:  Unable to Assess Affect (typically observed):  Unable to Assess Orientation:  Oriented to Self Alcohol / Substance use:  Not Applicable Psych involvement (Current and /or in the community):  No (Comment)  Discharge Needs  Concerns to be addressed:  Care Coordination Readmission within the last 30 days:  No Current discharge risk:  Cognitively Impaired, Dependent with Mobility Barriers to Discharge:  Continued Medical Work up   [employer], [place] 01/06/2018, 12:22 PM

## 2018-01-06 NOTE — Progress Notes (Signed)
Progress Note    Derrick Rubio  PRF:[id number] DOB: 07-08-1935  DOA: 01/04/2018 PCP: Crist Infante, FNP    Brief Narrative:    Medical records reviewed and are as summarized below:  Derrick Rubio is an 82 y.o. male with medical history significant for Alzheimer dementia, chronic kidney disease stage II-III, depression, and insulin-dependent diabetes mellitus, now presenting to the emergency department from his SNF for evaluation of the lethargy.  Patient is accompanied by his wife who assists with the history.  He has reportedly been more somnolent for more than a week now at the SNF and they had difficulty getting him to respond this evening, prompting transport to the ED for further evaluation.  He has not been complaining of anything per report of his spouse and has not appeared to have any dyspnea or cough.  There has not been any shaking or posturing.  No vomiting or diarrhea reported.  No recent fall or trauma.  He does not use alcohol or illicit substances.  No fevers have been documented.  There was some concern that the patient may have been aspirating at the facility, but again no dyspnea or cough.    Assessment/Plan:   Principal Problem:   Acute lower UTI Active Problems:   Alzheimer's dementia without behavioral disturbance   Type 2 diabetes with peripheral circulatory disorder, controlled (HCC)   Major depression, chronic   CKD (chronic kidney disease), stage III (HCC)   UTI (urinary tract infection)   UTI  - Presents from SNF with increased lethargy for more than a week  - ED workup suggests possible UTI  - He was treated with 1 g IV Rocephin in ED -urine pending but think was sent after abx - Continue current antibiotic with addition of zithromycin for fever last PM-- ? Aspiration and may need change of abx -xray with atelectasis- incentive spirometry but doubt he can follow commands -leukocytosis resolved  Lethargy  - Presents from SNF with lethargy  for more than a week, was reportedly difficult to rouse at the SNF, -after 1 liter NS and Rocephin in ED he was improving - Likely secondary to UTI and/or advancing dementia  - treat UTI -seems improved-- spoke with wife at bedside  Dementia  - Wife reports that patient typically answers simple questions, but has been somnolent and not speaking last several days  - Resume Namenda and Aricept   AKI - SCr is 1.31 on admission- improved with IVF to <1   Type II DM  - A1c 7.2% in April 2018  - Managed at the SNF with Lantus 18 units qHS and Humalog 0-9 units BID  - SSI for now  Hyperbilirubinemia  - resolved -? From dehydration  Dysphagia - There was concern at the SNF that patient could be aspirating  - DYS diet 1 with aspiration precautions -may need abx changed to cover  Patient from long term facility and does not walk at baseline, staff dresses and help with ALDs per wife     Family Communication/Anticipated D/C date and plan/Code Status   DVT prophylaxis: heparin. Code Status: DNR Family Communication: spoke with wife at bedside Disposition Plan: back to facility in AM if fever free  Medical Consultants:    None.    Subjective:   Appears to have temperature last PM  Objective:    Vitals:   01/05/18 0425 01/05/18 1506 01/05/18 2147 01/06/18 1028  BP: 120/64 (!) 149/92 117/62 120/78  Pulse: 72 91 82   Resp:  14 20    Temp: 98.6 F (37 C) 99.2 F (37.3 C) (!) 100.6 F (38.1 C)   TempSrc: Oral Axillary Oral   SpO2: 100% 97% 95%   Weight:      Height:        Intake/Output Summary (Last 24 hours) at 01/06/2018 1215 Last data filed at 01/06/2018 4034 Gross per 24 hour  Intake 780.01 ml  Output 600 ml  Net 180.01 ml   Filed Weights   01/05/18 0100  Weight: 88.8 kg (195 lb 12.3 oz)    Exam: In bed-- opens eyes when asked but otherwise keeps them close while eating rrr No increased work of breathing No LE edema Will answer simple  questions-- not oriented to time/place  Data Reviewed:   I have personally reviewed following labs and imaging studies:  Labs: Labs show the following:   Basic Metabolic Panel: Recent Labs  Lab 01/04/18 1745 01/04/18 1917 01/05/18 0549  NA 134* 136 136  K 6.4* 3.8 3.6  CL 100 100 104  CO2 25 28 24   GLUCOSE 128* 126* 102*  BUN 9 9 9   CREATININE 1.20 1.31* 1.06  CALCIUM 8.2* 8.8* 8.3*   GFR Estimated Creatinine Clearance: 62.8 mL/min (by C-G formula based on SCr of 1.06 mg/dL). Liver Function Tests: Recent Labs  Lab 01/04/18 1745 01/05/18 0549  AST 56* 28  ALT 43 33  ALKPHOS 77 62  BILITOT 2.4* 0.7  PROT 6.3* 6.8  ALBUMIN 2.9* 2.8*   No results for input(s): LIPASE, AMYLASE in the last 168 hours. Recent Labs  Lab 01/05/18 0549  AMMONIA 38*   Coagulation profile No results for input(s): INR, PROTIME in the last 168 hours.  CBC: Recent Labs  Lab 01/04/18 1917 01/05/18 0549  WBC 12.0* 10.3  NEUTROABS 9.3* 7.8*  HGB 12.4* 11.5*  HCT 37.6* 34.6*  MCV 90.2 89.4  PLT 292 294   Cardiac Enzymes: No results for input(s): CKTOTAL, CKMB, CKMBINDEX, TROPONINI in the last 168 hours. BNP (last 3 results) No results for input(s): PROBNP in the last 8760 hours. CBG: Recent Labs  Lab 01/06/18 0005 01/06/18 0106 01/06/18 0425 01/06/18 0438 01/06/18 0821  GLUCAP 158* 143* 149* 136* 133*   D-Dimer: No results for input(s): DDIMER in the last 72 hours. Hgb A1c: No results for input(s): HGBA1C in the last 72 hours. Lipid Profile: No results for input(s): CHOL, HDL, LDLCALC, TRIG, CHOLHDL, LDLDIRECT in the last 72 hours. Thyroid function studies: No results for input(s): TSH, T4TOTAL, T3FREE, THYROIDAB in the last 72 hours.  Invalid input(s): FREET3 Anemia work up: No results for input(s): VITAMINB12, FOLATE, FERRITIN, TIBC, IRON, RETICCTPCT in the last 72 hours. Sepsis Labs: Recent Labs  Lab 01/04/18 1917 01/05/18 0549  WBC 12.0* 10.3     Microbiology Recent Results (from the past 240 hour(s))  Urine culture     Status: Abnormal (Preliminary result)   Collection Time: 01/04/18  5:25 PM  Result Value Ref Range Status   Specimen Description URINE, RANDOM  Final   Special Requests NONE  Final   Culture (A)  Final    >=100,000 COLONIES/mL UNIDENTIFIED ORGANISM Performed at [facility], [place] [address], [place], [place] [address]    Report Status PENDING  Incomplete  MRSA PCR Screening     Status: None   Collection Time: 01/05/18  1:50 AM  Result Value Ref Range Status   MRSA by PCR NEGATIVE NEGATIVE Final    Comment:  The GeneXpert MRSA Assay (FDA approved for NASAL specimens only), is one component of a comprehensive MRSA colonization surveillance program. It is not intended to diagnose MRSA infection nor to guide or monitor treatment for MRSA infections. Performed at [facility], [place] [address], [place], [place] [address]     Procedures and diagnostic studies:  Ct Head Wo Contrast  Result Date: 01/04/2018 CLINICAL DATA:  Altered level of consciousness EXAM: CT HEAD WITHOUT CONTRAST TECHNIQUE: Contiguous axial images were obtained from the base of the skull through the vertex without intravenous contrast. COMPARISON:  None. FINDINGS: Brain: No acute intracranial hemorrhage. No focal mass lesion. No CT evidence of acute infarction. No midline shift or mass effect. No hydrocephalus. Basilar cisterns are patent. There are periventricular and subcortical white matter hypodensities. Generalized cortical atrophy. Ventricular dilatation proportional atrophy. Vascular: No hyperdense vessel or unexpected calcification. Skull: Normal. Negative for fracture or focal lesion. Sinuses/Orbits: No acute finding. Other: None. IMPRESSION: 1. No acute intracranial findings. 2. Chronic atrophy and white matter microvascular disease. 3. No change from 02/29/2016 Electronically Signed   By: Suzy Bouchard M.D.    On: 01/04/2018 18:32   Dg Chest Port 1 View  Result Date: 01/06/2018 CLINICAL DATA:  Fever. History of chronic kidney disease, diabetes and seizures. EXAM: PORTABLE CHEST 1 VIEW COMPARISON:  01/04/2018; 11/21/2016 FINDINGS: Grossly unchanged cardiac silhouette and mediastinal contours given reduced lung volumes. Minimal bibasilar heterogeneous opacities favored to represent atelectasis. Mild pulmonary venous congestion without frank evidence of edema. There is minimal pleuroparenchymal thickening about the peripheral aspect the right minor fissure, unchanged. No definite pleural effusion or pneumothorax. No acute osseus abnormalities. IMPRESSION: Pulmonary venous congestion and bibasilar atelectasis without definitive superimposed acute cardiopulmonary disease on this hypoventilated AP portable examination. Further evaluation with a PA and lateral chest radiograph may be obtained as clinically indicated. Electronically Signed   By: Sandi Mariscal M.D.   On: 01/06/2018 09:55   Dg Chest Portable 1 View  Result Date: 01/04/2018 CLINICAL DATA:  Coughing, choking, and hiccuping for 1 week. Altered mental status beginning today. EXAM: PORTABLE CHEST 1 VIEW COMPARISON:  11/21/2016 FINDINGS: The heart size and mediastinal contours are within normal limits. Aortic atherosclerosis. Low lung volumes and bibasilar atelectasis again seen. No evidence of pulmonary consolidation or edema. No evidence of pneumothorax or pleural effusion. The visualized skeletal structures are unremarkable. IMPRESSION: No significant change in low lung volumes and mild bibasilar atelectasis. Electronically Signed   By: Earle Gell M.D.   On: 01/04/2018 17:42    Medications:   . atorvastatin  20 mg Oral q1800  . citalopram  10 mg Oral Daily  . docusate sodium  100 mg Oral QHS  . donepezil  10 mg Oral QHS  . heparin  5,000 Units Subcutaneous Q8H  . insulin aspart  0-9 Units Subcutaneous Q4H  . latanoprost  1 drop Both Eyes QHS  .  lisinopril  2.5 mg Oral Daily  . memantine  28 mg Oral q1800  . senna-docusate  2 tablet Oral QHS  . tamsulosin  0.4 mg Oral q1800   Continuous Infusions: . azithromycin    . cefTRIAXone (ROCEPHIN)  IV Stopped (01/05/18 1815)     LOS: 0 days   Geradine Girt  [facility]   *Please refer to [email or web address], password [email or web address] to get updated schedule on who will round on this patient, as hospitalists switch teams weekly. If 7PM-7AM, please contact night-coverage at [email or web address], password [email or web address] for any overnight needs.  01/06/2018,  12:15 PM

## 2018-01-06 NOTE — Progress Notes (Signed)
SLP Cancellation Note  Patient Details Name: Derrick Rubio MRN: [id number] DOB: 08/02/1935   Cancelled treatment:       Reason Eval/Treat Not Completed: Other (comment) Pt getting cleaned up by NT at the moment. Will f/u as able.   Germain Osgood 01/06/2018, 9:44 AM  Germain Osgood, M.A. CCC-SLP [phone]

## 2018-01-07 DIAGNOSIS — F0280 Dementia in other diseases classified elsewhere without behavioral disturbance: Secondary | ICD-10-CM | POA: Diagnosis not present

## 2018-01-07 DIAGNOSIS — N390 Urinary tract infection, site not specified: Secondary | ICD-10-CM | POA: Diagnosis not present

## 2018-01-07 DIAGNOSIS — E1151 Type 2 diabetes mellitus with diabetic peripheral angiopathy without gangrene: Secondary | ICD-10-CM | POA: Diagnosis not present

## 2018-01-07 DIAGNOSIS — G308 Other Alzheimer's disease: Secondary | ICD-10-CM | POA: Diagnosis not present

## 2018-01-07 LAB — GLUCOSE, CAPILLARY
GLUCOSE-CAPILLARY: 125 mg/dL — AB (ref 70–99)
GLUCOSE-CAPILLARY: 122 mg/dL — AB (ref 70–99)
Glucose-Capillary: 147 mg/dL — ABNORMAL HIGH (ref 70–99)
Glucose-Capillary: 142 mg/dL — ABNORMAL HIGH (ref 70–99)

## 2018-01-07 MED ORDER — INSULIN LISPRO 100 UNIT/ML ~~LOC~~ SOLN: 0.0000 [IU] | Freq: Three times a day (TID) | SUBCUTANEOUS | 11 refills | Status: AC

## 2018-01-07 MED ORDER — AMOXICILLIN-POT CLAVULANATE 250-62.5 MG/5ML PO SUSR: 500.0000 mg | Freq: Two times a day (BID) | ORAL | 0 refills | Status: DC

## 2018-01-07 MED ORDER — AMOXICILLIN-POT CLAVULANATE 250-62.5 MG/5ML PO SUSR
500.0000 mg | Freq: Two times a day (BID) | ORAL | Status: DC
  Administered 2018-01-07: 500 mg via ORAL
  Filled 2018-01-07: qty 10

## 2018-01-07 NOTE — Progress Notes (Signed)
  Speech Language Pathology Treatment: Dysphagia  Patient Details Name: Derrick Rubio MRN: [id number] DOB: 10-01-1935 Today's Date: 01/07/2018 Time: 1224-4975 SLP Time Calculation (min) (ACUTE ONLY): 8 min  Assessment / Plan / Recommendation Clinical Impression  Pt was seen consuming thin liquids with no overt signs of aspiration. Min verbal cues were needed for acceptance and to facilitate brief oral holding. Pt does a good job self-regulating bolus size via straw. His wife is present today and says that he has been doing well with him current diet with minimal oral holding. She does say that he had been on a mechanical soft diet PTA. Discussed trying advanced textures today but she prefers reassessment at SNF. Will defer additional diet recommendations to f/u SLP there.   HPI HPI: Patient is an 82 year old male with history of Alzheimer's dementia, CKD, and DM presenting to the ED from SNF for AMS and concern for aspiration. CT Head negative, CXR without acute infection. AMS suspected to be from UTI and/or advancing dementia. Pt had two BSEs in September 2017, both recommending Dys 1 diet and thin liquids due to cognitively-based oral dysphagia. These were also noted to be his baseline diet textures at SNF at that time.      SLP Plan  Continue with current plan of care       Recommendations  Diet recommendations: Dysphagia 1 (puree);Thin liquid Liquids provided via: Cup;Straw Medication Administration: Crushed with puree Supervision: Staff to assist with self feeding;Full supervision/cueing for compensatory strategies Compensations: Minimize environmental distractions;Slow rate;Small sips/bites;Follow solids with liquid Postural Changes and/or Swallow Maneuvers: Seated upright 90 degrees                Oral Care Recommendations: Oral care BID Follow up Recommendations: Skilled Nursing facility SLP Visit Diagnosis: Dysphagia, unspecified (R13.10) Plan: Continue with current  plan of care       GO                Germain Osgood 01/07/2018, 1:42 PM  Germain Osgood, M.A. CCC-SLP [phone]

## 2018-01-07 NOTE — Social Work (Signed)
CSW spoke with pt wife at bedside, she is aware pt is being discharged today. Will update pt and pt wife when [facility] states they are ready for pt to return.   Continuing to follow. Signed DNR placed on pt chart.  Alexander Mt, [place] Work [phone]

## 2018-01-07 NOTE — Clinical Social Work Placement (Signed)
   CLINICAL SOCIAL WORK PLACEMENT  NOTE [facility]  Date:  01/07/2018  Patient Details  Name: Derrick Rubio MRN: [id number] Date of Birth: 04-Jul-1935  Clinical Social Work is seeking post-discharge placement for this patient at the [place] level of care (*CSW will initial, date and re-position this form in  chart as items are completed):      Patient/family provided with [place] Work Department's list of facilities offering this level of care within the geographic area requested by the patient (or if unable, by the patient's family).  Yes   Patient/family informed of their freedom to choose among providers that offer the needed level of care, that participate in Medicare, Medicaid or managed care program needed by the patient, have an available bed and are willing to accept the patient.      Patient/family informed of 's ownership interest in [facility] and [facility], as well as of the fact that they are under no obligation to receive care at these facilities.  PASRR submitted to EDS on       PASRR number received on       Existing PASRR number confirmed on 01/05/18     FL2 transmitted to all facilities in geographic area requested by pt/family on 01/05/18     FL2 transmitted to all facilities within larger geographic area on       Patient informed that his/her managed care company has contracts with or will negotiate with certain facilities, including the following:        Yes   Patient/family informed of bed offers received.  Patient chooses bed at [facility]     Physician recommends and patient chooses bed at      Patient to be transferred to [facility] on 01/07/18.  Patient to be transferred to facility by [facility]     Patient family notified on 01/07/18 of transfer.  Name of family member notified:  wife, Katharine Look     PHYSICIAN Please prepare prescriptions, Please sign DNR     Additional Comment:     _______________________________________________ Alexander Mt, [place] 01/07/2018, 9:52 AM

## 2018-01-07 NOTE — Social Work (Signed)
Clinical Social Worker facilitated patient discharge including contacting patient family and facility to confirm patient discharge plans.  Clinical information faxed to facility and family agreeable with plan.  CSW arranged ambulance transport via [facility] to [place] [facility] RN to call report to [phone] prior to discharge.  Clinical Social Worker will sign off for now as social work intervention is no longer needed. Please consult [place] again if new need arises.  Alexander Mt, [place] Social Worker [phone]

## 2018-01-07 NOTE — Discharge Summary (Addendum)
Physician Discharge Summary  Derrick Rubio YDX:[id number] DOB: [birth year] DOA: 01/04/2018  PCP: Crist Infante, FNP  Admit date: 01/04/2018 Discharge date: 01/07/2018  Admitted From: SNF Discharge disposition: SNF   Recommendations for Outpatient Follow-Up:   1. DNR 2. Have held long-acting insulin as blood sugars have been stable w/o-- with ? PO intake, may have been having low blood sugars 3. Liquid augmentin through 7/29 4. Oral care BID 5.  Dysphagia 1 (Puree);Thin liquid  Liquid Administration via: Straw Medication Administration: Crushed with puree Supervision: Staff to assist with self feeding;Full supervision/cueing for compensatory strategies Compensations: Minimize environmental distractions;Slow rate;Small sips/bites;Follow solids with liquid Postural Changes: Seated upright at 90 degrees;Remain upright for at least 30 minutes after po intake  6.  Consider having palliative care continue to follow patient at facility for worsening dementia  Discharge Diagnosis:   Principal Problem:   Acute lower UTI Active Problems:   Alzheimer's dementia without behavioral disturbance   Type 2 diabetes with peripheral circulatory disorder, controlled (HCC)   Major depression, chronic   UTI (urinary tract infection)    Discharge Condition: stable  Wound care: None.  Code status: Full.   History of Present Illness:   Derrick Rubio is a 82 y.o. male with medical history significant for Alzheimer dementia, chronic kidney disease stage II-III, depression, and insulin-dependent diabetes mellitus, now presenting to the emergency department from his SNF for evaluation of the lethargy.  Patient is accompanied by his wife who assists with the history.  He has reportedly been more somnolent for more than a week now at the SNF and they had difficulty getting him to respond this evening, prompting transport to the ED for further evaluation.  He has not been complaining of  anything per report of his spouse and has not appeared to have any dyspnea or cough.  There has not been any shaking or posturing.  No vomiting or diarrhea reported.  No recent fall or trauma.  He does not use alcohol or illicit substances.  No fevers have been documented.  There was some concern that the patient may have been aspirating at the SNF, but again no dyspnea or cough.     Hospital Course by Problem:   Lethargy due to UTIplus PNA (aspiration suspected) -Presents from SNF with increased lethargy for more than a week -ED workup suggests possible UTI -urine culture with group B strep -? Aspiration pneumonia -will change abx to liquid augmentin to finish course   Dementia -Wife reports that patient typically answers simple questions, but has been somnolent and not speaking last several days -Resume Namenda and Aricept  -? Palliative care to follow at facility -patient does have MOST form on file  AKI -SCr is 1.31 on admission- improved with IVF to  Type II DM -A1c 7.2% in April 2018 -Managed at the SNF with Lantus 18 units qHS and Humalog 0-9 units BID -SSI for now as intake is ? And would like to avoid hypoglycemia -resume lantus if needed  Hyperbilirubinemia -resolved -? From dehydration  Dysphagia - There was concern at the SNF that patient could be aspirating  - DYS diet 1 with aspiration precautions per SLP        Medical Consultants:      Discharge Exam:   Vitals:   01/06/18 2022 01/07/18 0418  BP: (!) 112/59 91/62  Pulse: 79 67  Resp: 20 16  Temp: 98 F (36.7 C) 98.6 F (37 C)  SpO2: 100% 98%  Vitals:   01/06/18 1028 01/06/18 1503 01/06/18 2022 01/07/18 0418  BP: 120/78  (!) 112/59 91/62  Pulse:  77 79 67  Resp:   20 16  Temp:  99.2 F (37.3 C) 98 F (36.7 C) 98.6 F (37 C)  TempSrc:  Oral Axillary Oral  SpO2:  98% 100% 98%  Weight:      Height:        General exam: will awaken to wife's voice and  answer simple questions with a yes or no-- wife feels patient is more alert and like himself  The results of significant diagnostics from this hospitalization (including imaging, microbiology, ancillary and laboratory) are listed below for reference.     Procedures and Diagnostic Studies:   Ct Head Wo Contrast  Result Date: 01/04/2018 CLINICAL DATA:  Altered level of consciousness EXAM: CT HEAD WITHOUT CONTRAST TECHNIQUE: Contiguous axial images were obtained from the base of the skull through the vertex without intravenous contrast. COMPARISON:  None. FINDINGS: Brain: No acute intracranial hemorrhage. No focal mass lesion. No CT evidence of acute infarction. No midline shift or mass effect. No hydrocephalus. Basilar cisterns are patent. There are periventricular and subcortical white matter hypodensities. Generalized cortical atrophy. Ventricular dilatation proportional atrophy. Vascular: No hyperdense vessel or unexpected calcification. Skull: Normal. Negative for fracture or focal lesion. Sinuses/Orbits: No acute finding. Other: None. IMPRESSION: 1. No acute intracranial findings. 2. Chronic atrophy and white matter microvascular disease. 3. No change from 02/29/2016 Electronically Signed   By: Suzy Bouchard M.D.   On: 01/04/2018 18:32   Dg Chest Portable 1 View  Result Date: 01/04/2018 CLINICAL DATA:  Coughing, choking, and hiccuping for 1 week. Altered mental status beginning today. EXAM: PORTABLE CHEST 1 VIEW COMPARISON:  11/21/2016 FINDINGS: The heart size and mediastinal contours are within normal limits. Aortic atherosclerosis. Low lung volumes and bibasilar atelectasis again seen. No evidence of pulmonary consolidation or edema. No evidence of pneumothorax or pleural effusion. The visualized skeletal structures are unremarkable. IMPRESSION: No significant change in low lung volumes and mild bibasilar atelectasis. Electronically Signed   By: Earle Gell M.D.   On: 01/04/2018 17:42      Labs:   Basic Metabolic Panel: Recent Labs  Lab 01/04/18 1745 01/04/18 1917 01/05/18 0549  NA 134* 136 136  K 6.4* 3.8 3.6  CL 100 100 104  CO2 25 28 24   GLUCOSE 128* 126* 102*  BUN 9 9 9   CREATININE 1.20 1.31* 1.06  CALCIUM 8.2* 8.8* 8.3*   GFR Estimated Creatinine Clearance: 62.8 mL/min (by C-G formula based on SCr of 1.06 mg/dL). Liver Function Tests: Recent Labs  Lab 01/04/18 1745 01/05/18 0549  AST 56* 28  ALT 43 33  ALKPHOS 77 62  BILITOT 2.4* 0.7  PROT 6.3* 6.8  ALBUMIN 2.9* 2.8*   No results for input(s): LIPASE, AMYLASE in the last 168 hours. Recent Labs  Lab 01/05/18 0549  AMMONIA 38*   Coagulation profile No results for input(s): INR, PROTIME in the last 168 hours.  CBC: Recent Labs  Lab 01/04/18 1917 01/05/18 0549  WBC 12.0* 10.3  NEUTROABS 9.3* 7.8*  HGB 12.4* 11.5*  HCT 37.6* 34.6*  MCV 90.2 89.4  PLT 292 294   Cardiac Enzymes: No results for input(s): CKTOTAL, CKMB, CKMBINDEX, TROPONINI in the last 168 hours. BNP: Invalid input(s): POCBNP CBG: Recent Labs  Lab 01/06/18 1616 01/06/18 2019 01/07/18 0126 01/07/18 0418 01/07/18 0815  GLUCAP 230* 179* 147* 142* 125*   D-Dimer No results for  input(s): DDIMER in the last 72 hours. Hgb A1c No results for input(s): HGBA1C in the last 72 hours. Lipid Profile No results for input(s): CHOL, HDL, LDLCALC, TRIG, CHOLHDL, LDLDIRECT in the last 72 hours. Thyroid function studies No results for input(s): TSH, T4TOTAL, T3FREE, THYROIDAB in the last 72 hours.  Invalid input(s): FREET3 Anemia work up No results for input(s): VITAMINB12, FOLATE, FERRITIN, TIBC, IRON, RETICCTPCT in the last 72 hours. Microbiology Recent Results (from the past 240 hour(s))  Urine culture     Status: Abnormal   Collection Time: 01/04/18  5:25 PM  Result Value Ref Range Status   Specimen Description URINE, RANDOM  Final   Special Requests NONE  Final   Culture (A)  Final    >=100,000 COLONIES/mL  GROUP B STREP(S.AGALACTIAE)ISOLATED TESTING AGAINST S. AGALACTIAE NOT ROUTINELY PERFORMED DUE TO PREDICTABILITY OF AMP/PEN/VAN SUSCEPTIBILITY. Performed at [facility], [place] [address] [place], [place] [address]    Report Status 01/06/2018 FINAL  Final  MRSA PCR Screening     Status: None   Collection Time: 01/05/18  1:50 AM  Result Value Ref Range Status   MRSA by PCR NEGATIVE NEGATIVE Final    Comment:        The GeneXpert MRSA Assay (FDA approved for NASAL specimens only), is one component of a comprehensive MRSA colonization surveillance program. It is not intended to diagnose MRSA infection nor to guide or monitor treatment for MRSA infections. Performed at [facility], [place] [address], [place], [place] [address]      Discharge Instructions:   Discharge Instructions    Increase activity slowly   Complete by:  As directed      Allergies as of 01/07/2018   No Known Allergies     Medication List    STOP taking these medications   chlorproMAZINE 25 MG tablet Commonly known as:  THORAZINE   HYDROcodone-acetaminophen 5-325 MG tablet Commonly known as:  NORCO/VICODIN   insulin glargine 100 UNIT/ML injection Commonly known as:  LANTUS   lisinopril 2.5 MG tablet Commonly known as:  PRINIVIL,ZESTRIL   promethazine 25 mg in sodium chloride 0.9 % 1,000 mL     TAKE these medications   acetaminophen 500 MG tablet Commonly known as:  TYLENOL Take 500 mg by mouth every 4 (four) hours as needed for mild pain or fever.   amoxicillin-clavulanate 250-62.5 MG/5ML suspension Commonly known as:  AUGMENTIN Take 10 mLs (500 mg total) by mouth every 12 (twelve) hours.   atorvastatin 20 MG tablet Commonly known as:  LIPITOR Take 20 mg by mouth daily at 6 PM.   citalopram 10 MG tablet Commonly known as:  CELEXA Take 10 mg by mouth daily.   docusate sodium 100 MG capsule Commonly known as:  COLACE Take 100 mg by mouth at bedtime.   donepezil 10 MG  tablet Commonly known as:  ARICEPT Take 10 mg by mouth at bedtime.   insulin lispro 100 UNIT/ML injection Commonly known as:  HUMALOG Inject 0-0.09 mLs (0-9 Units total) into the skin 3 (three) times daily with meals. Per sliding scale: Blood sugar 0-200=0U;  201-250=5U; 251-300=7U; 301-350=9U; >350 call MD. What changed:  when to take this   latanoprost 0.005 % ophthalmic solution Commonly known as:  XALATAN Place 1 drop into both eyes at bedtime.   Melatonin 3 MG Tabs Take 3 mg by mouth at bedtime.   NAMENDA XR 28 MG Cp24 24 hr capsule Generic drug:  memantine Take 28 mg  by mouth daily at 6 PM.   sennosides-docusate sodium 8.6-50 MG tablet Commonly known as:  SENOKOT-S Take 2 tablets by mouth at bedtime. HOLD FOR LOOSE STOOLS   tamsulosin 0.4 MG Caps capsule Commonly known as:  FLOMAX Take 0.4 mg by mouth daily at 6 PM.   Vitamin D-3 5000 units Tabs Take 50,000 Units by mouth every 30 (thirty) days.         Time coordinating discharge: 35 min  Signed:  Geradine Girt  [facility] 01/07/2018, 9:09 AM

## 2018-01-07 NOTE — Progress Notes (Signed)
Pt d/c to [facility], report called to receiving RN, family at [facility], family verbalized understanding of d/c, all questions answered

## 2018-02-13 ENCOUNTER — Other Ambulatory Visit: Payer: Self-pay

## 2018-02-13 DIAGNOSIS — I7090 Unspecified atherosclerosis: Secondary | ICD-10-CM

## 2018-02-20 ENCOUNTER — Encounter: Payer: Medicare Other | Admitting: Vascular Surgery

## 2018-02-20 ENCOUNTER — Encounter (HOSPITAL_COMMUNITY): Payer: Medicare Other

## 2018-03-10 ENCOUNTER — Encounter (HOSPITAL_COMMUNITY): Payer: Medicare Other

## 2018-03-10 ENCOUNTER — Encounter: Payer: Medicare Other | Admitting: Vascular Surgery

## 2018-08-26 ENCOUNTER — Emergency Department (HOSPITAL_COMMUNITY): Payer: Medicare Other

## 2018-08-26 ENCOUNTER — Encounter (HOSPITAL_COMMUNITY): Payer: Self-pay | Admitting: Emergency Medicine

## 2018-08-26 ENCOUNTER — Emergency Department (HOSPITAL_COMMUNITY)
Admission: EM | Admit: 2018-08-26 | Discharge: 2018-08-26 | Disposition: A | Discharge status: Home / Self Care | Payer: Medicare Other | Attending: Emergency Medicine | Admitting: Emergency Medicine

## 2018-08-26 ENCOUNTER — Other Ambulatory Visit: Payer: Self-pay

## 2018-08-26 DIAGNOSIS — R569 Unspecified convulsions: Secondary | ICD-10-CM

## 2018-08-26 LAB — CBC
HCT: 46.1 % (ref 39.0–52.0)
Hemoglobin: 14.8 g/dL (ref 13.0–17.0)
MCH: 29.4 pg (ref 26.0–34.0)
MCHC: 32.1 g/dL (ref 30.0–36.0)
MCV: 91.7 fL (ref 80.0–100.0)
Platelets: 227 10*3/uL (ref 150–400)
RBC: 5.03 MIL/uL (ref 4.22–5.81)
RDW: 14.5 % (ref 11.5–15.5)
WBC: 5.7 10*3/uL (ref 4.0–10.5)
nRBC: 0 % (ref 0.0–0.2)

## 2018-08-26 LAB — BASIC METABOLIC PANEL
Anion gap: 5 (ref 5–15)
BUN: 8 mg/dL (ref 8–23)
CO2: 27 mmol/L (ref 22–32)
CREATININE: 1.34 mg/dL — AB (ref 0.61–1.24)
Calcium: 9.3 mg/dL (ref 8.9–10.3)
Chloride: 104 mmol/L (ref 98–111)
GFR calc Af Amer: 57 mL/min — ABNORMAL LOW (ref >60)
GFR, EST NON AFRICAN AMERICAN: 49 mL/min — AB (ref >60)
Glucose, Bld: 127 mg/dL — ABNORMAL HIGH (ref 70–99)
Potassium: 4.6 mmol/L (ref 3.5–5.1)
Sodium: 136 mmol/L (ref 135–145)

## 2018-08-26 LAB — MAGNESIUM: Magnesium: 2.1 mg/dL (ref 1.7–2.4)

## 2018-08-26 LAB — CBG MONITORING, ED: Glucose-Capillary: 117 mg/dL — ABNORMAL HIGH (ref 70–99)

## 2018-08-26 LAB — PHOSPHORUS: Phosphorus: 4 mg/dL (ref 2.5–4.6)

## 2018-08-26 MED ORDER — LEVETIRACETAM IN NACL 500 MG/100ML IV SOLN: 500.0000 mg | Freq: Two times a day (BID) | INTRAVENOUS | Status: DC

## 2018-08-26 MED ORDER — GADOBUTROL 1 MMOL/ML IV SOLN
8.0000 mL | Freq: Once | INTRAVENOUS | Status: AC | PRN
  Administered 2018-08-26: 8 mL via INTRAVENOUS

## 2018-08-26 MED ORDER — LEVETIRACETAM 500 MG PO TABS: 500.0000 mg | ORAL_TABLET | Freq: Two times a day (BID) | ORAL | 2 refills | Status: AC

## 2018-08-26 MED ORDER — LEVETIRACETAM IN NACL 1000 MG/100ML IV SOLN
1000.0000 mg | Freq: Once | INTRAVENOUS | Status: AC
  Administered 2018-08-26: 1000 mg via INTRAVENOUS
  Filled 2018-08-26: qty 100

## 2018-08-26 NOTE — Progress Notes (Signed)
Portable EEG completed, results pending. 

## 2018-08-26 NOTE — ED Provider Notes (Signed)
Patient accepted from Dr. Kathrynn Humble at shift change.  Patient had new onset seizure with history of dementia.  Patient has had EEG per recommendation of neurology.  Patient will have MRI.  Reviewed results of MRI with neurology and discuss potential discharge on Keppra.  Patient will need reassessment.  Patient is alert in no distress.  Mental status is pleasantly interactive.  He wishes something to eat.  His wife is at bedside.  We reviewed the plan of starting Keppra twice daily on outpatient basis.  As per reviewed with Dr. Cheral Marker.   Charlesetta Shanks, MD 08/26/18 1900

## 2018-08-26 NOTE — ED Triage Notes (Signed)
Pt BIB [facility] for new onset seizures. Staff and family advised the pt has NO hx of seizures. Staff and family advised EMS that the seizure lasted 1-2 minutes in duration with a right sided gaze. EMS advised there was no gaze upon their arrival and pt seemed to be postictal however EMS is not able to discern if that is normal. EMS advised vital signs stable as noted with a CBG of 145, 12 lead unremarkable. EMS advised IV attempted however same was unsuccessful. EMS advised pt opened his eyes and answered yes to his name at registration.

## 2018-08-26 NOTE — ED Notes (Signed)
[facility] called for transport.  

## 2018-08-26 NOTE — ED Notes (Signed)
Pt's sps. in ED lobby

## 2018-08-26 NOTE — ED Notes (Signed)
Pt returned from MRI °

## 2018-08-26 NOTE — Procedures (Signed)
ELECTROENCEPHALOGRAM REPORT   Patient: Derrick Rubio       Room #: [facility] EEG No. ID: [id number] Age: 83 y.o.        Sex: male Referring Physician: Pfeiffer Report Date:  08/26/2018        Interpreting Physician: Alexis Goodell  History: Derrick Rubio is an 83 y.o. male with new onset seizures  Medications:  Keppra  Conditions of Recording:  This is a 21 channel routine scalp EEG performed with bipolar and monopolar montages arranged in accordance to the international 10/20 system of electrode placement. One channel was dedicated to EKG recording.  The patient is in the poorly responsive state.  Description:  Artifact is prominent during the recording often obscuring the background rhythm. When able to be visualized the background is slow and poorly organized.   It consists of a low voltage, polymorphic delta rhythm that is diffusely distributed and continuous No epileptiform activity is noted.   Hyperventilation and intermittent photic stimulation were not performed.   IMPRESSION: This is an abnormal EEG secondary to general background slowing.  This finding may be seen with a diffuse disturbance that is etiologically nonspecific, but may include a metabolic encephalopathy, among other possibilities.  No epileptiform activity was noted.     Alexis Goodell, MD Neurology [phone] 08/26/2018, 6:10 PM

## 2018-08-26 NOTE — Consult Note (Signed)
NEURO HOSPITALIST CONSULT NOTE   Requestig physician: Dr. Kathrynn Humble  Reason for Consult: New onset GTC seizure  History obtained from:  Patient's Wife and Chart     HPI:                                                                                                                                          Derrick Rubio is an 83 y.o. male presenting to the ED via EMS with new onset GTC seizure. The seizure lasted for 1-2 minutes with generalized jerking movements and right sided gaze, per informants to EMS at home. When EMS arrived, the gaze deviation had resolved and the patient seemed to be postictal. His VS were stable on EMS arrival. CBG was 145 and 12 lead EKG was unremarkable. On arrival to the bridge, the patient was able to open his eyes and say yes to his name.   His wife states that he had a similar event about 2 years ago.   BP 126/82, O2 sat 100%, HR 85, temp 98.9  Na, Ca and Mg are normal. No leukocytosis or anemia. BUN/Cr 8/1.34. Serum CO2 normal.   CT head: No acute abnormality. Severe atrophy. Moderate chronic microvascular ischemic change in the white matter.  Past Medical History:  Diagnosis Date  . Alzheimer's disease ([place])   . CKD (chronic kidney disease)   . Dementia ([place])   . Depression due to dementia ([place]) 09/16/2013  . Diabetes mellitus without complication ([place])   . DVT (deep venous thrombosis) ([place]) 09/16/2013  . Glaucoma   . Hyperlipemia   . Latent tuberculosis 09/16/2013  . Osteoarthritis 09/16/2013  . Prostate cancer ([place]) 2006   s/p seed radiation  . Pure hypercholesterolemia 10/18/2013  . Seizure-like activity [facility])     Past Surgical History:  Procedure Laterality Date  . COLONOSCOPY WITH PROPOFOL N/A 01/24/2014   Procedure: COLONOSCOPY WITH PROPOFOL;  Surgeon: Garlan Fair, MD;  Location: [facility] ENDOSCOPY;  Service: Endoscopy;  Laterality: N/A;  . ivc filter placement      Family History  Problem Relation Age of Onset  .  Heart attack Mother   . Heart attack Father               Social History:  reports that he has never smoked. He has never used smokeless tobacco. He reports that he does not drink alcohol or use drugs.  No Known Allergies  HOME MEDICATIONS:  ROS:                                                                                                                                       Limited by patient's dementia. The patient shakes no to the following: Headache, neck pain, chest pain, abdominal pain and limb pain. Unable to obtain additional ROS.    Blood pressure 105/69, pulse 63, temperature 98.9 F (37.2 C), temperature source Oral, resp. rate 16, height 6' (1.829 m), weight 81.6 kg, SpO2 99 %.   General Examination:                                                                                                       Physical Exam  HEENT-  [place]/AT  Lungs- Respirations unlabored Extremities- Warm and well perfused  Neurological Examination Mental Status: Awake. Refuses to communicate verbally but will shake head (baseline per wife is maximum of 7-word sentences). Keeps eyes closed for most of the exam. Decreased ability to follow verbal commands.  Cranial Nerves: II: Pupils appeared symmetric when eyes were transiently opened. He fixated briefly on examiner's face.   III,IV, VI: Eyes conjugate when briefly opened.  V: Closes eyes tightly when examiner touches eyelids prior to attempting to passively raise them VII: Face is symmetric. Does not smile to command.  VIII: responds to some commands IX,X: Unable to assess XI: Head is midline XII: Does not protrude tongue to command Motor: Extremities are rigid x 4 without asymmetry, both in flexion and extension. The patient apparently refuses to move them for motor testing, but will partially withdraw BLE with 4-/5  strength to plantar stimulation.  Sensory: Not cooperative with sensory exam but withdraws to plantar stimulation.  Deep Tendon Reflexes: Trace reflexes in the context of rigidity Plantars: Right: Equivocal  Left: Equivocal Cerebellar: Not cooperative with exam.  Gait: Unable to assess   Lab Results: Basic Metabolic Panel: Recent Labs  Lab 08/26/18 1145  NA 136  K 4.6  CL 104  CO2 27  GLUCOSE 127*  BUN 8  CREATININE 1.34*  CALCIUM 9.3  MG 2.1  PHOS 4.0    CBC: Recent Labs  Lab 08/26/18 1145  WBC 5.7  HGB 14.8  HCT 46.1  MCV 91.7  PLT 227    Cardiac Enzymes: No results for input(s): CKTOTAL, CKMB, CKMBINDEX, TROPONINI in the last 168 hours.  Lipid Panel: No results for input(s): CHOL, TRIG, HDL, CHOLHDL, VLDL, LDLCALC in the last 168 hours.  Imaging:  Ct Head Wo Contrast  Result Date: 08/26/2018 CLINICAL DATA:  Seizure EXAM: CT HEAD WITHOUT CONTRAST TECHNIQUE: Contiguous axial images were obtained from the base of the skull through the vertex without intravenous contrast. COMPARISON:  CT head 01/04/2018 FINDINGS: Brain: Advanced atrophy. Severe atrophy in the temporal lobes and hippocampus bilaterally compatible with dementia. Moderate ventricular enlargement compatible with atrophy is unchanged. Moderate chronic white matter changes. Negative for acute infarct, hemorrhage, or mass. No fluid collection or midline shift. Vascular: Negative for hyperdense vessel Skull: Negative Sinuses/Orbits: Negative Other: None IMPRESSION: No acute abnormality Severe atrophy. Moderate chronic microvascular ischemic change in the white matter. Electronically Signed   By: Franchot Gallo M.D.   On: 08/26/2018 12:58    Assessment: 83 year old male with Alzheimer's dementia, presenting with new-onset GTC seizure. 1. EEG completed with official read pending.  2. CT head shows no acute abnormality. There is severe atrophy, worse in the temporal lobes and hippocampi. Also noted are moderate  chronic microvascular ischemic changes. Images personally reviewed.  3. MRI brain with and without contrast is pending.   Recommendations: 1. Seizure precautions.  2. Given the severe temporal lobe atrophy on CT head, this may be considered a positive finding with regards to structural assessment for potentially epileptogenic lesion. This would be an indication for starting an anticonvulsant in the setting of first time seizure given that there are no precipitating factors seen on his initial labs and he is afebrile without signs/symptoms of intercurrent illness. A second peak for epilepsy incidence occurs in old age, with increasing probability in the setting of dementia, as well as with increasing age.   3. Keppra 1000 mg load has been ordered, followed by 500 mg IV BID.  4. Await official MRI and EEG results   Electronically signed: Dr. Kerney Elbe  08/26/2018, 1:24 PM

## 2018-08-26 NOTE — ED Notes (Signed)
Patient transported to MRI 

## 2018-08-26 NOTE — Discharge Instructions (Addendum)
1.  Start taking Keppra 500 mg twice a day. 2.  Schedule a recheck with the neurologist as soon as possible, at least within the next several weeks. 3.  Avoid any activities that could result in injury if patient has another seizure.

## 2018-10-27 NOTE — ED Provider Notes (Signed)
[facility] EMERGENCY DEPARTMENT Provider Note   CSN: [id number] Arrival date & time: 08/26/18  1129    History   Chief Complaint Chief Complaint  Patient presents with  . Seizures    HPI Derrick Rubio is a 83 y.o. male.     HPI Level 5 caveat for dementia.  83 year old male presented to the ER with chief complaint of new onset seizure. Patient has history of Alzheimer's dementia, CKD, diabetes, DVT.  He is unable to provide any meaningful history surrounding his seizure-like activity.  According to EMS, patient has no history of seizures.  Patient will had a witnessed episode that lasted 1 to 2-minutes where patient was having generalized tonic-clonic movement.  Patient also had right-sided gaze.   Past Medical History:  Diagnosis Date  . Alzheimer's disease ([place])   . CKD (chronic kidney disease)   . Dementia ([place])   . Depression due to dementia ([place]) 09/16/2013  . Diabetes mellitus without complication ([place])   . DVT (deep venous thrombosis) ([place]) 09/16/2013  . Glaucoma   . Hyperlipemia   . Latent tuberculosis 09/16/2013  . Osteoarthritis 09/16/2013  . Prostate cancer ([place]) 2006   s/p seed radiation  . Pure hypercholesterolemia 10/18/2013  . Seizure-like activity [facility])     Patient Active Problem List   Diagnosis Date Noted  . CKD (chronic kidney disease), stage III ([place]) 01/04/2018  . Acute lower UTI 01/04/2018  . UTI (urinary tract infection) 01/04/2018  . History of DVT (deep vein thrombosis) 04/15/2016  . Protein-calorie malnutrition, severe 03/01/2016  . Dehydration 02/17/2016  . Major depression, chronic 12/08/2015  . Chronic constipation 11/09/2015  . Microalbuminuria due to type 2 diabetes mellitus ([place]) 09/07/2015  . BPH without obstruction/lower urinary tract symptoms 10/24/2014  . Hyperlipidemia LDL goal <100 10/24/2014  . Type 2 diabetes with peripheral circulatory disorder, controlled ([place]) 10/24/2014  . Glaucoma 11/02/2013  .  Alzheimer's dementia without behavioral disturbance ([place]) 09/16/2013  . Osteoarthritis 09/16/2013    Past Surgical History:  Procedure Laterality Date  . COLONOSCOPY WITH PROPOFOL N/A 01/24/2014   Procedure: COLONOSCOPY WITH PROPOFOL;  Surgeon: Garlan Fair, MD;  Location: [facility];  Service: Endoscopy;  Laterality: N/A;  . ivc filter placement          Home Medications    Prior to Admission medications   Medication Sig Start Date End Date Taking? Authorizing Provider  acetaminophen (TYLENOL) 500 MG tablet Take 500 mg by mouth every 4 (four) hours as needed for mild pain or fever.    Yes [provider]  atorvastatin (LIPITOR) 20 MG tablet Take 20 mg by mouth daily at 6 PM.    Yes [provider]  cholecalciferol (VITAMIN D) 25 MCG (1000 UT) tablet Take 1,000 Units by mouth daily.   Yes [provider]  Cholecalciferol (VITAMIN D-3) 5000 units TABS Take 50,000 Units by mouth every 30 (thirty) days.   Yes [provider]  citalopram (CELEXA) 10 MG tablet Take 10 mg by mouth daily.   Yes [provider]  docusate sodium (COLACE) 100 MG capsule Take 100 mg by mouth at bedtime.   Yes [provider]  donepezil (ARICEPT) 10 MG tablet Take 10 mg by mouth at bedtime.   Yes [provider]  insulin glargine (LANTUS) 100 UNIT/ML injection Inject 14 Units into the skin at bedtime.   Yes [provider]  insulin lispro (HUMALOG) 100 UNIT/ML injection Inject 0-0.09 mLs (0-9 Units total) into the skin  3 (three) times daily with meals. Per sliding scale: Blood sugar 0-200=0U;  201-250=5U; 251-300=7U; 301-350=9U; >350 call MD. Patient taking differently: Inject 2 Units into the skin 3 (three) times daily as needed for high blood sugar (only give if CBG is >200).  01/07/18  Yes Vann, Jessica U, DO  latanoprost (XALATAN) 0.005 % ophthalmic solution Place 1 drop into both eyes at bedtime.    Yes [provider]   Melatonin 3 MG TABS Take 3 mg by mouth at bedtime.    Yes [provider]  memantine (NAMENDA XR) 28 MG CP24 24 hr capsule Take 28 mg by mouth daily at 6 PM.    Yes [provider]  oseltamivir (TAMIFLU) 30 MG capsule Take 30 mg by mouth daily. Started on 08-12-18 Ends on 08-26-18 (Z20.828 Contact with and suspected exposure)   Yes [provider]  sennosides-docusate sodium (SENOKOT-S) 8.6-50 MG tablet Take 2 tablets by mouth at bedtime. HOLD FOR LOOSE STOOLS   Yes [provider]  tamsulosin (FLOMAX) 0.4 MG CAPS capsule Take 0.4 mg by mouth daily at 6 PM.    Yes [provider]  levETIRAcetam (KEPPRA) 500 MG tablet Take 1 tablet (500 mg total) by mouth 2 (two) times daily. 08/26/18   Charlesetta Shanks, MD    Family History Family History  Problem Relation Age of Onset  . Heart attack Mother   . Heart attack Father     Social History Social History   Tobacco Use  . Smoking status: Never Smoker  . Smokeless tobacco: Never Used  Substance Use Topics  . Alcohol use: No  . Drug use: No     Allergies   Patient has no known allergies.   Review of Systems Review of Systems  Unable to perform ROS: Dementia  Constitutional: Positive for activity change.  Neurological: Positive for seizures.     Physical Exam Updated Vital Signs BP 116/78   Pulse 78   Temp 98.9 F (37.2 C) (Oral)   Resp 18   Ht 6' (1.829 m)   Wt 81.6 kg   SpO2 95%   BMI 24.41 kg/m   Physical Exam Vitals signs and nursing note reviewed.  Constitutional:      Appearance: He is well-developed.  HENT:     Head: Atraumatic.  Eyes:     Extraocular Movements: Extraocular movements intact.     Pupils: Pupils are equal, round, and reactive to light.  Neck:     Musculoskeletal: Neck supple.  Cardiovascular:     Rate and Rhythm: Normal rate.  Pulmonary:     Effort: Pulmonary effort is normal.  Skin:    General: Skin is warm.  Neurological:     Mental Status:  He is alert. Mental status is at baseline.      ED Treatments / Results  Labs (all labs ordered are listed, but only abnormal results are displayed) Labs Reviewed  BASIC METABOLIC PANEL - Abnormal; Notable for the following components:      Result Value   Glucose, Bld 127 (*)    Creatinine, Ser 1.34 (*)    GFR calc non Af Amer 49 (*)    GFR calc Af Amer 57 (*)    All other components within normal limits  CBG MONITORING, ED - Abnormal; Notable for the following components:   Glucose-Capillary 117 (*)    All other components within normal limits  CBC  MAGNESIUM  PHOSPHORUS    EKG EKG Interpretation  Date/Time:  Wednesday August 26 2018 11:36:59 EDT Ventricular Rate:  60 PR Interval:    QRS Duration: 96 QT Interval:  430 QTC Calculation: 434 R Axis:   -14 Text Interpretation:  Atrial fibrillation Low voltage, extremity and precordial leads Anteroseptal infarct, old No acute changes No significant change since last tracing Confirmed by Varney Biles [phone]) on 08/26/2018 12:52:04 PM   Radiology No results found.  Procedures Procedures (including critical care time)  Medications Ordered in ED Medications  levETIRAcetam (KEPPRA) IVPB 1000 mg/100 mL premix (0 mg Intravenous Stopped 08/26/18 1408)  gadobutrol (GADAVIST) 1 MMOL/ML injection 8 mL (8 mLs Intravenous Contrast Given 08/26/18 1644)     Initial Impression / Assessment and Plan / ED Course  I have reviewed the triage vital signs and the nursing notes.  Pertinent labs & imaging results that were available during my care of the patient were reviewed by me and considered in my medical decision making (see chart for details).        DDx: -Seizure disorder -Meningitis -Trauma -ICH -Electrolyte abnormality -Metabolic derangement -Stroke -Toxin induced seizures -Medication side effects -Hypoxia -Hypoglycemia  Elderly male without any history of seizure, but history of dementia, CKD, DVT brought into  the ER after witnessed seizure-like activity. Neurology was consulted for the first seizure-like activity for a patient who is over the age of 36.  CT scan of the head is negative and they have recommended the patient get further diagnostic work-up.  Appropriate studies ordered and patient's care signed out to Dr. Vallery Ridge. Disposition pending results of the MRI.  Final Clinical Impressions(s) / ED Diagnoses   Final diagnoses:  Seizure [facility])    ED Discharge Orders         Ordered    levETIRAcetam (KEPPRA) 500 MG tablet  2 times daily     08/26/18 1858           Varney Biles, MD 10/27/18 [phone]

## 2019-06-08 IMAGING — CT CT HEAD WITHOUT CONTRAST
3 series · 17 of 37 positions shown, 19 images · non-contrast
Comparison: CT head 01/04/2018

CLINICAL DATA: Seizure

EXAM:
CT HEAD WITHOUT CONTRAST
TECHNIQUE: Contiguous axial images were obtained from the base of the skull
through the vertex without intravenous contrast.

[Series 3: head wo · axial · 0.46mm/px · z∈[-222,-72]mm · 7 of 40 slices shown, 9 images]
[im 5/40  brain]
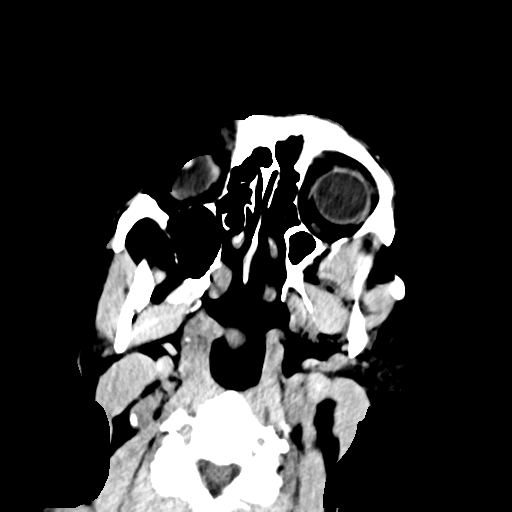
[im 5/40  bone]
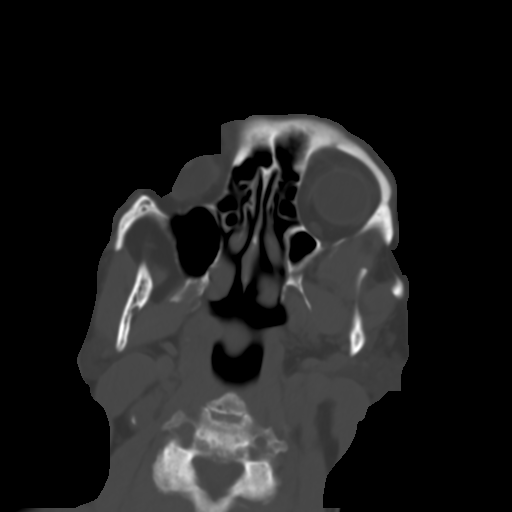
[im 10/40  brain]
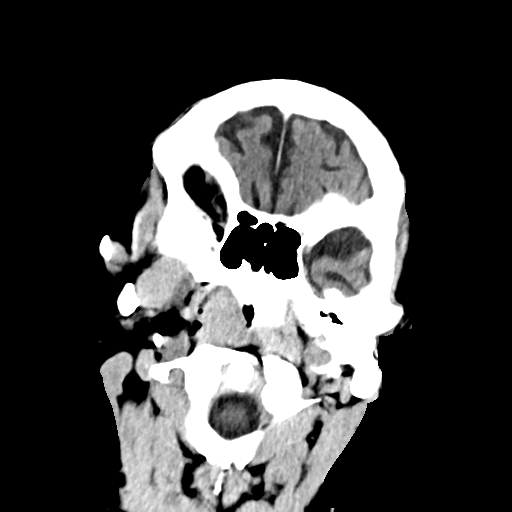
[im 15/40  brain]
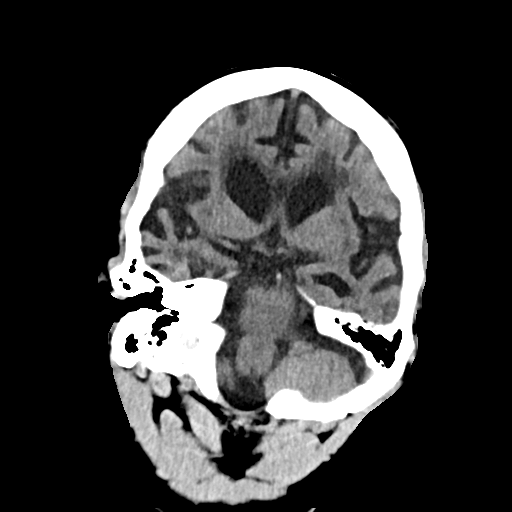
[im 20/40  brain]
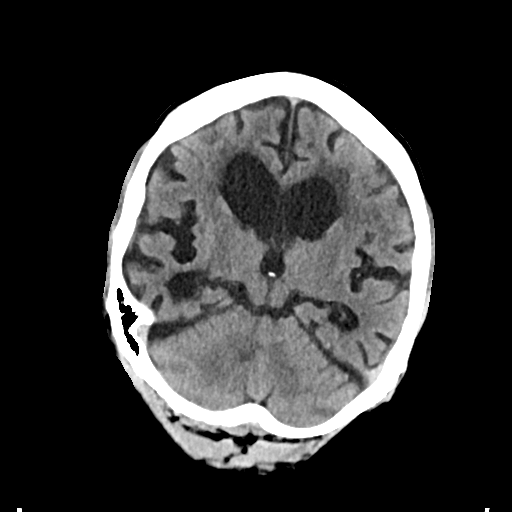
[im 25/40  brain]
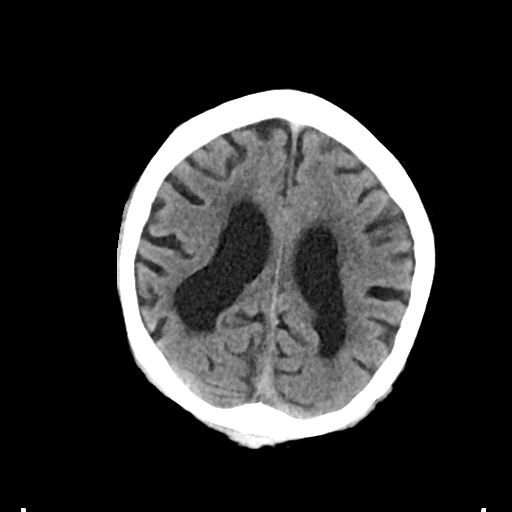
[im 25/40  bone]
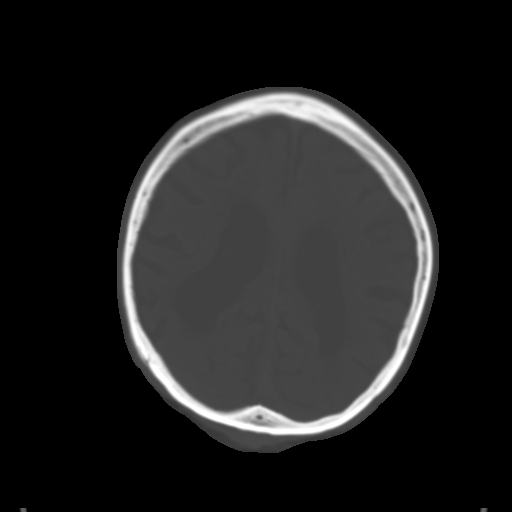
[im 30/40  brain]
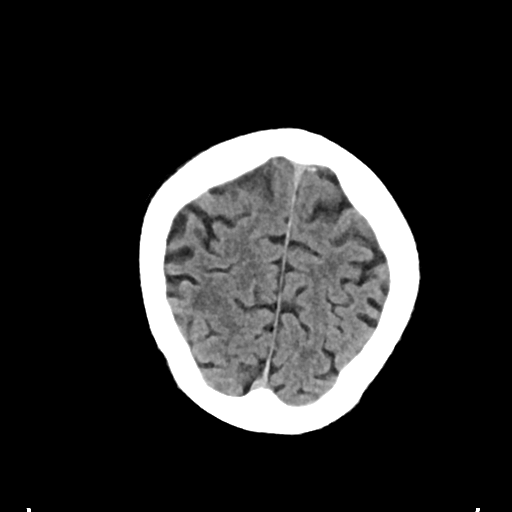
[im 35/40  brain]
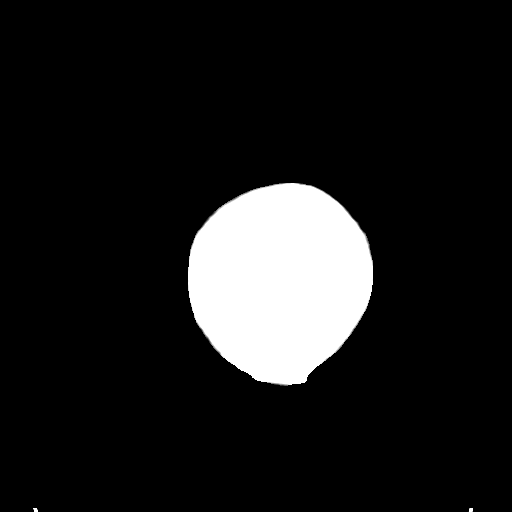

[Series 4: head bone · axial · 0.46mm/px · z∈[-224,-88]mm · 7 of 98 slices shown]
[im 10/98  bone]
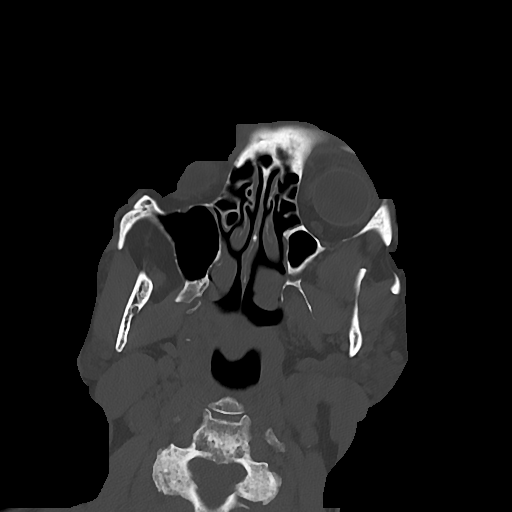
[im 20/98  bone]
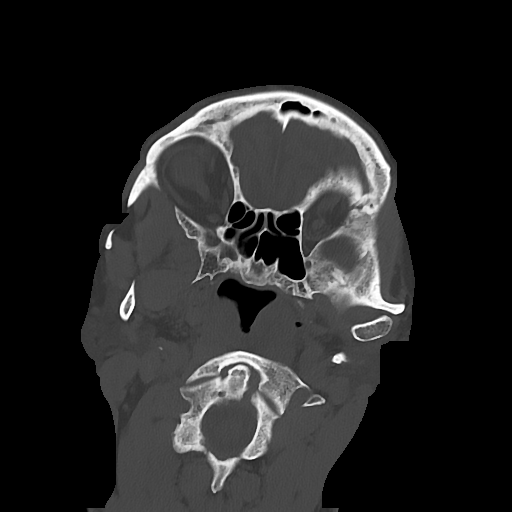
[im 30/98  bone]
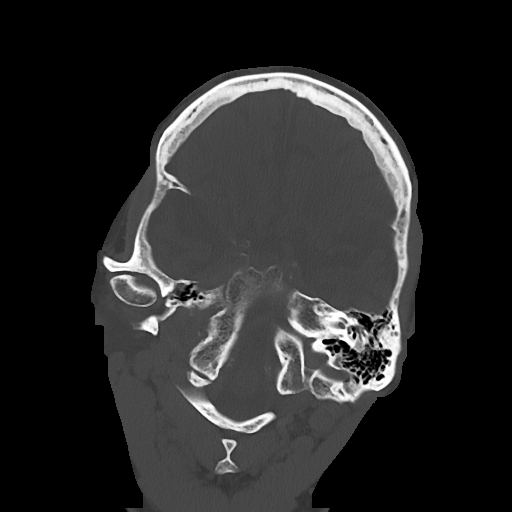
[im 44/98  bone]
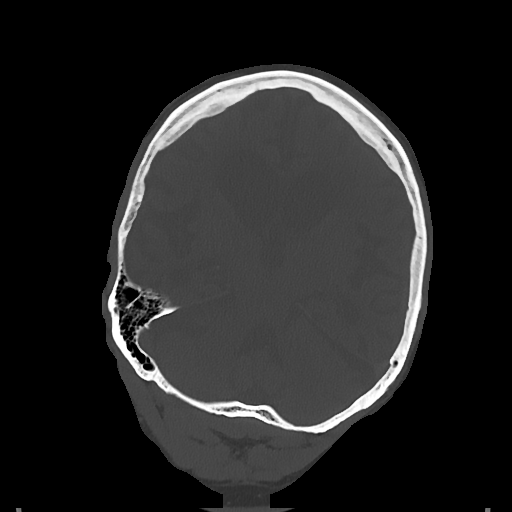
[im 54/98  bone]
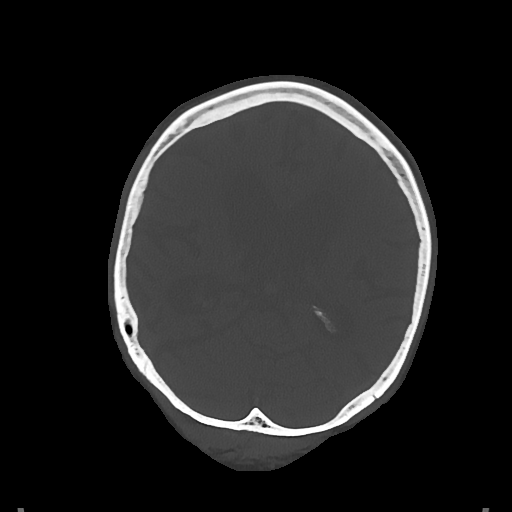
[im 68/98  bone]
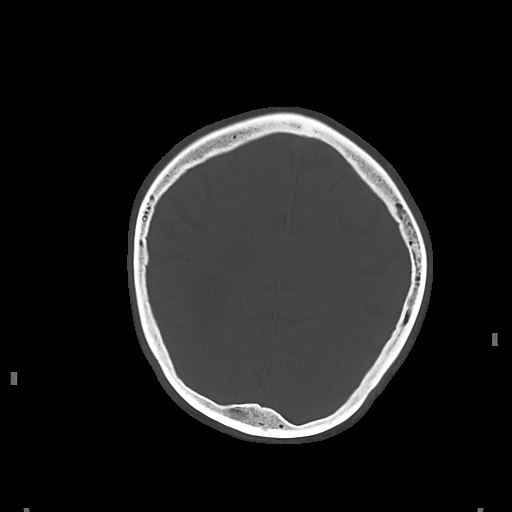
[im 78/98  bone]
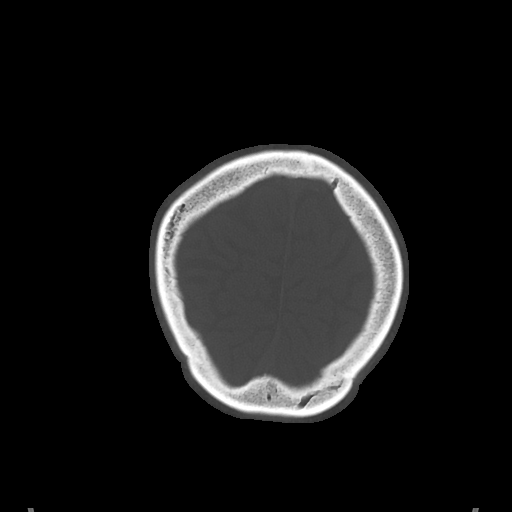

[Series 6: sag soft · sagittal · 0.39mm/px · 3 of 67 slices shown]
[im 23/67  brain]
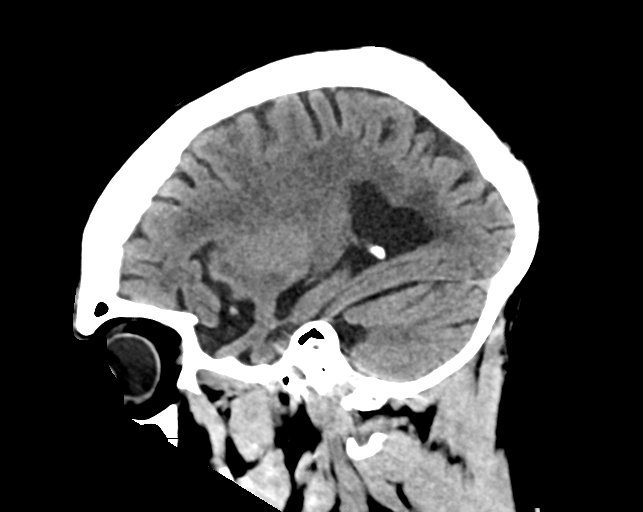
[im 34/67  brain]
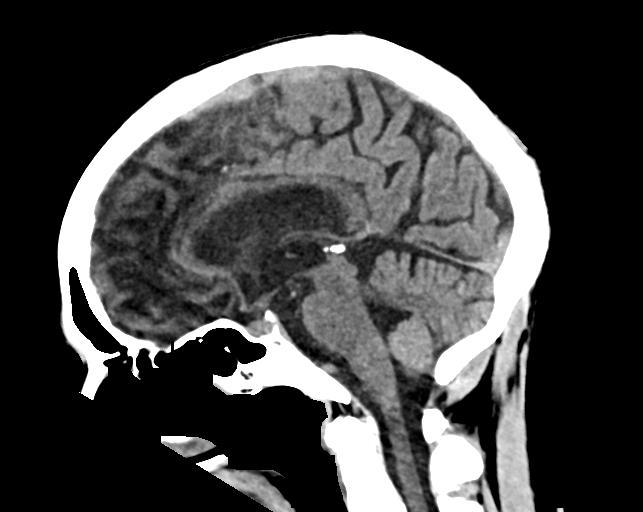
[im 45/67  brain]
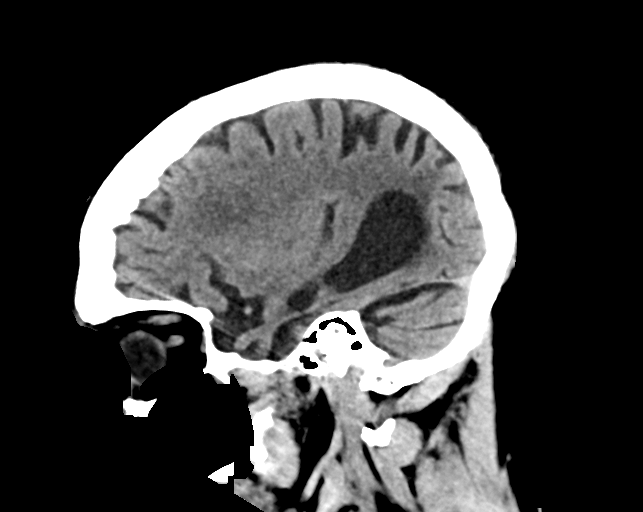

[17 of 37 positions shown; findings below may reference images not displayed]

FINDINGS: Brain: Advanced atrophy. Severe atrophy in the temporal lobes and
hippocampus bilaterally compatible with dementia. Moderate
ventricular enlargement compatible with atrophy is unchanged.
Moderate chronic white matter changes.

Negative for acute infarct, hemorrhage, or mass. No fluid collection
or midline shift.

Vascular: Negative for hyperdense vessel

Skull: Negative

Sinuses/Orbits: Negative

Other: None
IMPRESSION: No acute abnormality

Severe atrophy. Moderate chronic microvascular ischemic change in
the white matter.

## 2019-08-08 ENCOUNTER — Emergency Department (HOSPITAL_COMMUNITY)
Admission: EM | Admit: 2019-08-08 | Discharge: 2019-08-08 | Disposition: A | Discharge status: Home / Self Care | Attending: Emergency Medicine | Admitting: Emergency Medicine

## 2019-08-08 ENCOUNTER — Other Ambulatory Visit: Payer: Self-pay

## 2019-08-08 DIAGNOSIS — Z794 Long term (current) use of insulin: Secondary | ICD-10-CM | POA: Insufficient documentation

## 2019-08-08 DIAGNOSIS — E872 Acidosis, unspecified: Secondary | ICD-10-CM

## 2019-08-08 DIAGNOSIS — E1151 Type 2 diabetes mellitus with diabetic peripheral angiopathy without gangrene: Secondary | ICD-10-CM | POA: Diagnosis not present

## 2019-08-08 DIAGNOSIS — E119 Type 2 diabetes mellitus without complications: Secondary | ICD-10-CM | POA: Diagnosis not present

## 2019-08-08 DIAGNOSIS — Z515 Encounter for palliative care: Secondary | ICD-10-CM

## 2019-08-08 DIAGNOSIS — Z79899 Other long term (current) drug therapy: Secondary | ICD-10-CM | POA: Diagnosis not present

## 2019-08-08 DIAGNOSIS — E875 Hyperkalemia: Secondary | ICD-10-CM | POA: Diagnosis not present

## 2019-08-08 DIAGNOSIS — E871 Hypo-osmolality and hyponatremia: Secondary | ICD-10-CM

## 2019-08-08 DIAGNOSIS — R52 Pain, unspecified: Secondary | ICD-10-CM

## 2019-08-08 DIAGNOSIS — E1122 Type 2 diabetes mellitus with diabetic chronic kidney disease: Secondary | ICD-10-CM | POA: Diagnosis not present

## 2019-08-08 DIAGNOSIS — F0280 Dementia in other diseases classified elsewhere without behavioral disturbance: Secondary | ICD-10-CM | POA: Diagnosis present

## 2019-08-08 DIAGNOSIS — G309 Alzheimer's disease, unspecified: Secondary | ICD-10-CM | POA: Insufficient documentation

## 2019-08-08 DIAGNOSIS — E860 Dehydration: Secondary | ICD-10-CM

## 2019-08-08 DIAGNOSIS — N183 Chronic kidney disease, stage 3 unspecified: Secondary | ICD-10-CM | POA: Insufficient documentation

## 2019-08-08 LAB — CBC WITH DIFFERENTIAL/PLATELET
Abs Immature Granulocytes: 0.05 10*3/uL (ref 0.00–0.07)
Basophils Absolute: 0 10*3/uL (ref 0.0–0.1)
Basophils Relative: 0 %
Eosinophils Absolute: 0 10*3/uL (ref 0.0–0.5)
Eosinophils Relative: 0 %
HCT: 38.7 % — ABNORMAL LOW (ref 39.0–52.0)
Hemoglobin: 12.7 g/dL — ABNORMAL LOW (ref 13.0–17.0)
Immature Granulocytes: 1 %
Lymphocytes Relative: 6 %
Lymphs Abs: 0.7 10*3/uL (ref 0.7–4.0)
MCH: 30.2 pg (ref 26.0–34.0)
MCHC: 32.8 g/dL (ref 30.0–36.0)
MCV: 92.1 fL (ref 80.0–100.0)
Monocytes Absolute: 0.7 10*3/uL (ref 0.1–1.0)
Monocytes Relative: 7 %
Neutro Abs: 9.4 10*3/uL — ABNORMAL HIGH (ref 1.7–7.7)
Neutrophils Relative %: 86 %
Platelets: 269 10*3/uL (ref 150–400)
RBC: 4.2 MIL/uL — ABNORMAL LOW (ref 4.22–5.81)
RDW: 15.5 % (ref 11.5–15.5)
WBC: 11 10*3/uL — ABNORMAL HIGH (ref 4.0–10.5)
nRBC: 0 % (ref 0.0–0.2)

## 2019-08-08 LAB — COMPREHENSIVE METABOLIC PANEL
ALT: 28 U/L (ref 0–44)
AST: 43 U/L — ABNORMAL HIGH (ref 15–41)
Albumin: 3.3 g/dL — ABNORMAL LOW (ref 3.5–5.0)
Alkaline Phosphatase: 59 U/L (ref 38–126)
Anion gap: 13 (ref 5–15)
BUN: 10 mg/dL (ref 8–23)
CO2: 19 mmol/L — ABNORMAL LOW (ref 22–32)
Calcium: 8.6 mg/dL — ABNORMAL LOW (ref 8.9–10.3)
Chloride: 98 mmol/L (ref 98–111)
Creatinine, Ser: 1.02 mg/dL (ref 0.61–1.24)
GFR calc Af Amer: >60 mL/min (ref >60)
GFR calc non Af Amer: >60 mL/min (ref >60)
Glucose, Bld: 183 mg/dL — ABNORMAL HIGH (ref 70–99)
Potassium: 5.3 mmol/L — ABNORMAL HIGH (ref 3.5–5.1)
Sodium: 130 mmol/L — ABNORMAL LOW (ref 135–145)
Total Bilirubin: 1.9 mg/dL — ABNORMAL HIGH (ref 0.3–1.2)
Total Protein: 7.2 g/dL (ref 6.5–8.1)

## 2019-08-08 LAB — LACTIC ACID, PLASMA
Lactic Acid, Venous: 2.3 mmol/L (ref 0.5–1.9)
Lactic Acid, Venous: 2.9 mmol/L (ref 0.5–1.9)

## 2019-08-08 LAB — CBG MONITORING, ED: Glucose-Capillary: 161 mg/dL — ABNORMAL HIGH (ref 70–99)

## 2019-08-08 LAB — LIPASE, BLOOD: Lipase: 18 U/L (ref 11–51)

## 2019-08-08 MED ORDER — MORPHINE SULFATE (PF) 4 MG/ML IV SOLN
4.0000 mg | Freq: Once | INTRAVENOUS | Status: AC
  Administered 2019-08-08: 03:00:00 4 mg via INTRAVENOUS
  Filled 2019-08-08: qty 1

## 2019-08-08 MED ORDER — FENTANYL CITRATE (PF) 100 MCG/2ML IJ SOLN
50.0000 ug | Freq: Once | INTRAMUSCULAR | Status: AC
  Administered 2019-08-08: 09:00:00 50 ug via INTRAVENOUS
  Filled 2019-08-08: qty 2

## 2019-08-08 MED ORDER — LACTATED RINGERS IV BOLUS
1000.0000 mL | Freq: Once | INTRAVENOUS | Status: AC
  Administered 2019-08-08: 02:00:00 1000 mL via INTRAVENOUS

## 2019-08-08 MED ORDER — FENTANYL CITRATE (PF) 100 MCG/2ML IJ SOLN
50.0000 ug | Freq: Once | INTRAMUSCULAR | Status: AC
  Administered 2019-08-08: 11:00:00 50 ug via INTRAVENOUS
  Filled 2019-08-08: qty 2

## 2019-08-08 MED ORDER — LORAZEPAM 2 MG/ML IJ SOLN
1.0000 mg | Freq: Once | INTRAMUSCULAR | Status: AC
  Administered 2019-08-08: 02:00:00 1 mg via INTRAVENOUS
  Filled 2019-08-08: qty 1

## 2019-08-08 MED ORDER — LACTATED RINGERS IV BOLUS
1000.0000 mL | Freq: Once | INTRAVENOUS | Status: AC
  Administered 2019-08-08: 04:00:00 1000 mL via INTRAVENOUS

## 2019-08-08 MED ORDER — FENTANYL CITRATE (PF) 100 MCG/2ML IJ SOLN
50.0000 ug | Freq: Once | INTRAMUSCULAR | Status: AC
  Administered 2019-08-08: 50 ug via INTRAVENOUS
  Filled 2019-08-08: qty 2

## 2019-08-08 NOTE — Care Management (Signed)
ED CM noted TOC consult, patient is at [facility] on [facility] and was brought to the [facility] ED by EMS for pain before contacting [facility].  ED CM contacted Anderson Malta RN on call with [facility] to discuss this issue, She states she will contact the facility to re-educated staff at the facility and patient will return back to [facility].  CM also spoke with patient's spouse who stated she asked the nurse in the facility if Hospice was notified and no response from the nurse, CM also made spouse aware that she can also contact [facility] verbalized understanding. Hospice will follow up with patient at the facility.

## 2019-08-08 NOTE — ED Notes (Signed)
Pt lives at [facility] will return by [facility] .

## 2019-08-08 NOTE — ED Triage Notes (Signed)
Pt coming from nursing facility and is normally able to asnwer "yes or no" questions per wife and staff. Around 2100 this evening pt began to moan and was not answering questions. EMS noted abd bloating and facility was unsure when last BM was. Vitals 122/78, 108 HR, RR 24, 96% RA. CBG 208, temp 101

## 2019-08-08 NOTE — ED Provider Notes (Signed)
  Physical Exam  BP (!) 89/57   Pulse 93   Temp 99.7 F (37.6 C) (Axillary)   Resp 18   SpO2 97%   Physical Exam  ED Course/Procedures   Clinical Course as of Aug 07 1126  Sun Aug 08, 2019  0639 Hospice patient here for pain control. Awaiting SW/CM consult as well as hospice staff to coordinate with skilled nursing center for filling meds and arranging comfort care measures for this patient at the center. Once this is complete, patient may return to skilled nursing center.    [KM]  Darby from case management help to facilitate patient returning to facility and discussed with the facility and nursing staff there the protocol for pain management for this patient.  Patient okay to be discharged back home.   [KM]    Clinical Course User Index [KM] Alveria Apley, PA-C    Procedures  MDM  Care assumed from resident due to change of shift. See previous note for full patient HPI and workup.      Alveria Apley, PA-C 08/08/19 1128    Pattricia Boss, MD 08/08/19 [phone]

## 2019-08-08 NOTE — Progress Notes (Signed)
[facility] Liaison ED Note:  RN received a call from [place] ([place]) inquiring on a Hospice in the ED. Liaison verified pt is a Hospice pt from a facility. Pt was transported to the ED after midnight and treated for pain management. Spoke with Luellen Pucker bedside RN who indicates pt is doing well and comfortable with doses of Fentanyl administrated at 8:45AM and around 1 PM today.   Plan: Pt will return to the facility pending transportation. Note liaison has alerted the facility [facility]) that the pt would be returning.  Raina Mina, RN,BSN [facility] Liaison ([place]) [phone]

## 2019-08-08 NOTE — ED Provider Notes (Signed)
[facility] EMERGENCY DEPARTMENT Provider Note   CSN: [id number] Arrival date & time: 08/08/19  0030     History No chief complaint on file.   Derrick Rubio is a 84 y.o. male who is currently on hospice and resides at a nursing facility who is presenting for pain management.  His wife who is also his POA was at bedside and provided the history.  She notes that she received a call from the nursing facility today stating that he had become altered and was moaning.  She says unfortunately that they were unable to manage the pain there--most likely because his altered mental status would not permit p.o. pain medication administration. We discussed our options in terms of working up his altered mental status and pain versus simply palliative pain management.  The wife states that she would just like his pain to be managed at this time. LEVEL 5 CAVEAT  Past Medical History:  Diagnosis Date  . Alzheimer's disease ([place])   . CKD (chronic kidney disease)   . Dementia ([place])   . Depression due to dementia ([place]) 09/16/2013  . Diabetes mellitus without complication ([place])   . DVT (deep venous thrombosis) ([place]) 09/16/2013  . Glaucoma   . Hyperlipemia   . Latent tuberculosis 09/16/2013  . Osteoarthritis 09/16/2013  . Prostate cancer ([place]) 2006   s/p seed radiation  . Pure hypercholesterolemia 10/18/2013  . Seizure-like activity [facility])     Patient Active Problem List   Diagnosis Date Noted  . CKD (chronic kidney disease), stage III 01/04/2018  . Acute lower UTI 01/04/2018  . UTI (urinary tract infection) 01/04/2018  . History of DVT (deep vein thrombosis) 04/15/2016  . Protein-calorie malnutrition, severe 03/01/2016  . Dehydration 02/17/2016  . Major depression, chronic 12/08/2015  . Chronic constipation 11/09/2015  . Microalbuminuria due to type 2 diabetes mellitus ([place]) 09/07/2015  . BPH without obstruction/lower urinary tract symptoms 10/24/2014  . Hyperlipidemia LDL goal  <100 10/24/2014  . Type 2 diabetes with peripheral circulatory disorder, controlled ([place]) 10/24/2014  . Glaucoma 11/02/2013  . Alzheimer's dementia without behavioral disturbance ([place]) 09/16/2013  . Osteoarthritis 09/16/2013    Past Surgical History:  Procedure Laterality Date  . COLONOSCOPY WITH PROPOFOL N/A 01/24/2014   Procedure: COLONOSCOPY WITH PROPOFOL;  Surgeon: Garlan Fair, MD;  Location: [facility];  Service: Endoscopy;  Laterality: N/A;  . ivc filter placement         Family History  Problem Relation Age of Onset  . Heart attack Mother   . Heart attack Father     Social History   Tobacco Use  . Smoking status: Never Smoker  . Smokeless tobacco: Never Used  Substance Use Topics  . Alcohol use: No  . Drug use: No    Home Medications Prior to Admission medications   Medication Sig Start Date End Date Taking? Authorizing Provider  acetaminophen (TYLENOL) 500 MG tablet Take 1,000 mg by mouth every 4 (four) hours as needed for mild pain or fever.    Yes [provider]  acetaminophen (TYLENOL) 500 MG tablet Take 500 mg by mouth in the morning and at bedtime.   Yes [provider]  docusate sodium (COLACE) 100 MG capsule Take 100 mg by mouth at bedtime.   Yes [provider]  insulin glargine (LANTUS) 100 UNIT/ML injection Inject 14 Units into the skin at bedtime.   Yes [provider]  insulin lispro (HUMALOG) 100 UNIT/ML injection Inject 0-0.09 mLs (0-9 Units total)  into the skin 3 (three) times daily with meals. Per sliding scale: Blood sugar 0-200=0U;  201-250=5U; 251-300=7U; 301-350=9U; >350 call MD. Patient taking differently: Inject 2 Units into the skin in the morning and at bedtime. Only give 2 units if CBG>200 01/07/18  Yes Vann, Jessica U, DO  latanoprost (XALATAN) 0.005 % ophthalmic solution Place 1 drop into both eyes at bedtime.    Yes [provider]  levETIRAcetam (KEPPRA) 500 MG tablet Take 1 tablet (500  mg total) by mouth 2 (two) times daily. 08/26/18  Yes Charlesetta Shanks, MD  Morphine Sulfate (MORPHINE CONCENTRATE) 10 mg / 0.5 ml concentrated solution Take 5 mg by mouth every 4 (four) hours as needed for severe pain.   Yes [provider]  sennosides-docusate sodium (SENOKOT-S) 8.6-50 MG tablet Take 2 tablets by mouth at bedtime. HOLD FOR LOOSE STOOLS   Yes [provider]  tamsulosin (FLOMAX) 0.4 MG CAPS capsule Take 0.4 mg by mouth daily at 6 PM.    Yes [provider]  UNABLE TO FIND Take 120 mLs by mouth in the morning and at bedtime. Med Name: MedPass   Yes [provider]    Allergies    Patient has no known allergies.  Review of Systems   Review of Systems  Unable to perform ROS: Mental status change  LEVEL 5 CAVEAT  Physical Exam Updated Vital Signs BP 107/70   Pulse 92   Temp 99.7 F (37.6 C) (Axillary)   Resp (!) 24   SpO2 98%   Physical Exam Constitutional:      General: He is in acute distress.     Appearance: He is toxic-appearing.  HENT:     Head: Atraumatic.     Mouth/Throat:     Mouth: Mucous membranes are dry.  Cardiovascular:     Rate and Rhythm: Tachycardia present.  Pulmonary:     Effort: No respiratory distress.     Breath sounds: Normal breath sounds.  Abdominal:     General: Bowel sounds are normal.     Tenderness: There is abdominal tenderness.  Skin:    General: Skin is warm and dry.  Neurological:     Comments: AMS barring complete neurological exam.      ED Results / Procedures / Treatments   Labs (all labs ordered are listed, but only abnormal results are displayed) Labs Reviewed  CBC WITH DIFFERENTIAL/PLATELET - Abnormal; Notable for the following components:      Result Value   WBC 11.0 (*)    RBC 4.20 (*)    Hemoglobin 12.7 (*)    HCT 38.7 (*)    Neutro Abs 9.4 (*)    All other components within normal limits  COMPREHENSIVE METABOLIC PANEL - Abnormal; Notable for the following components:    Sodium 130 (*)    Potassium 5.3 (*)    CO2 19 (*)    Glucose, Bld 183 (*)    Calcium 8.6 (*)    Albumin 3.3 (*)    AST 43 (*)    Total Bilirubin 1.9 (*)    All other components within normal limits  LACTIC ACID, PLASMA - Abnormal; Notable for the following components:   Lactic Acid, Venous 2.3 (*)    All other components within normal limits  LACTIC ACID, PLASMA - Abnormal; Notable for the following components:   Lactic Acid, Venous 2.9 (*)    All other components within normal limits  CBG MONITORING, ED - Abnormal; Notable for the following components:  Glucose-Capillary 161 (*)    All other components within normal limits  LIPASE, BLOOD  LEVETIRACETAM LEVEL    EKG None  Radiology No results found.   Medications Ordered in ED Medications  morphine 4 MG/ML injection 4 mg (4 mg Intravenous Given 08/08/19 0326)  lactated ringers bolus 1,000 mL (0 mLs Intravenous Stopped 08/08/19 0321)  LORazepam (ATIVAN) injection 1 mg (1 mg Intravenous Given 08/08/19 0150)  lactated ringers bolus 1,000 mL (1,000 mLs Intravenous New Bag/Given 08/08/19 0413)    ED Course  I have reviewed the triage vital signs and the nursing notes.  Pertinent labs & imaging results that were available during my care of the patient were reviewed by me and considered in my medical decision making (see chart for details).    MDM Rules/Calculators/A&P  77:93 AM 84 year old male on hospice who was transferred to [place] from his nursing facility for altered mental status and pain management.  His wife and I discussed in length about goals of care.  She does not wish for further work-up however would like [place] to make him as comfortable as possible.  I reached out to his [place], to discuss discharge disposition and what they would be able to offer for pain management at his nursing facility.  Morphine ordered for pain for now.  1:55 AM Spoke with RN from [facility] Unclear why he was sent to ER  however she will work on getting liquid pain meds to the facility in the morning.  5:28 AM After Dr. Evelina Dun further discussion with wife, she is agreeable to some basic labs for evaluation for things we could treat symptomatically.  5:28 AM Labs suggestive of some dehydration. 1L LR bolus given. Lactic acid 2.3 which has a broad ddx however would suspect that given his overall picture is consistent with dehydration from poor po intake, it is also contributory to the elevated LR.  Will be able to discharge closer to morning when pharmacies open and facility is able to obtain the liquid Roxanol. Otherwise, I think given his wife's wishes for limited treatment/workup, it is appropriate to give more fluids until then.  5:28 AM reassess. Blood pressure and tachycardia improved with IVF. Pain appears well controlled with morphine. Can consider fentanyl to try to avoid hypotensive if he needs another dose prior to discharge. Wife updated on plan. Final Clinical Impression(s) / ED Diagnoses Final diagnoses:  Lactic acidosis  Dehydration  Hyperkalemia  Hyponatremia  Pain management  End of life care    Rx / DC Orders ED Discharge Orders    None       Mitzi Hansen, MD 08/08/19 [id number]    Merrily Pew, MD 08/08/19 [phone]

## 2019-08-08 NOTE — Discharge Instructions (Addendum)
It was a pleasure meeting you and your wife tonight. I spoke with the folks at [facility] and they will help arrange the medication administration at the facility.

## 2019-08-09 LAB — LEVETIRACETAM LEVEL: Levetiracetam Lvl: 1.4 ug/mL — ABNORMAL LOW (ref 10.0–40.0)

## 2019-12-16 DEATH — deceased
